# Patient Record
Sex: Female | Born: 1975
Health system: Southern US, Community
[De-identification: ages and names within clinical notes are randomized; demographics above are authoritative.]

## PROBLEM LIST (undated history)

## (undated) DIAGNOSIS — D649 Anemia, unspecified: Secondary | ICD-10-CM

## (undated) DIAGNOSIS — F329 Major depressive disorder, single episode, unspecified: Secondary | ICD-10-CM

## (undated) DIAGNOSIS — K589 Irritable bowel syndrome without diarrhea: Secondary | ICD-10-CM

## (undated) DIAGNOSIS — K219 Gastro-esophageal reflux disease without esophagitis: Secondary | ICD-10-CM

## (undated) DIAGNOSIS — F4310 Post-traumatic stress disorder, unspecified: Secondary | ICD-10-CM

## (undated) DIAGNOSIS — F32A Depression, unspecified: Secondary | ICD-10-CM

## (undated) DIAGNOSIS — F79 Unspecified intellectual disabilities: Secondary | ICD-10-CM

## (undated) DIAGNOSIS — T7421XA Adult sexual abuse, confirmed, initial encounter: Secondary | ICD-10-CM

## (undated) DIAGNOSIS — F29 Unspecified psychosis not due to a substance or known physiological condition: Secondary | ICD-10-CM

## (undated) HISTORY — PX: BACK SURGERY: SHX140

## (undated) HISTORY — DX: Anemia, unspecified: D64.9

## (undated) HISTORY — DX: Gastro-esophageal reflux disease without esophagitis: K21.9

## (undated) HISTORY — DX: Irritable bowel syndrome, unspecified: K58.9

## (undated) HISTORY — PX: WRIST SURGERY: SHX841

## (undated) HISTORY — DX: Adult sexual abuse, confirmed, initial encounter: T74.21XA

---

## 2001-04-06 ENCOUNTER — Inpatient Hospital Stay (HOSPITAL_COMMUNITY): Admission: EM | Admit: 2001-04-06 | Discharge: 2001-04-09 | Payer: Self-pay | Admitting: Psychiatry

## 2001-11-26 ENCOUNTER — Inpatient Hospital Stay (HOSPITAL_COMMUNITY): Admission: EM | Admit: 2001-11-26 | Discharge: 2001-11-30 | Payer: Self-pay | Admitting: Psychiatry

## 2004-07-26 ENCOUNTER — Emergency Department (HOSPITAL_COMMUNITY): Admission: EM | Admit: 2004-07-26 | Discharge: 2004-07-26 | Payer: Self-pay | Admitting: Emergency Medicine

## 2006-07-15 ENCOUNTER — Encounter: Admission: RE | Admit: 2006-07-15 | Discharge: 2006-07-15 | Payer: Self-pay | Admitting: Family Medicine

## 2006-12-30 ENCOUNTER — Other Ambulatory Visit: Admission: RE | Admit: 2006-12-30 | Discharge: 2006-12-30 | Payer: Self-pay | Admitting: Family Medicine

## 2008-01-11 ENCOUNTER — Other Ambulatory Visit: Admission: RE | Admit: 2008-01-11 | Discharge: 2008-01-11 | Payer: Self-pay | Admitting: Family Medicine

## 2008-03-09 ENCOUNTER — Encounter: Admission: RE | Admit: 2008-03-09 | Discharge: 2008-03-09 | Payer: Self-pay | Admitting: Family Medicine

## 2008-04-14 ENCOUNTER — Emergency Department (HOSPITAL_COMMUNITY): Admission: EM | Admit: 2008-04-14 | Discharge: 2008-04-14 | Payer: Self-pay | Admitting: Emergency Medicine

## 2008-12-19 ENCOUNTER — Emergency Department (HOSPITAL_COMMUNITY): Admission: EM | Admit: 2008-12-19 | Discharge: 2008-12-19 | Payer: Self-pay | Admitting: Emergency Medicine

## 2010-04-17 LAB — BASIC METABOLIC PANEL
BUN: 18 mg/dL (ref 6–23)
CO2: 26 mEq/L (ref 19–32)
Calcium: 8.9 mg/dL (ref 8.4–10.5)
Chloride: 103 mEq/L (ref 96–112)
Creatinine, Ser: 0.98 mg/dL (ref 0.4–1.2)
GFR calc Af Amer: >60 mL/min (ref >60)
GFR calc non Af Amer: >60 mL/min (ref >60)
Glucose, Bld: 81 mg/dL (ref 70–99)
Potassium: 3.6 mEq/L (ref 3.5–5.1)
Sodium: 137 mEq/L (ref 135–145)

## 2010-04-17 LAB — CBC
HCT: 37.7 % (ref 36.0–46.0)
Hemoglobin: 12.5 g/dL (ref 12.0–15.0)
MCHC: 33.1 g/dL (ref 30.0–36.0)
MCV: 90.1 fL (ref 78.0–100.0)
Platelets: 140 10*3/uL — ABNORMAL LOW (ref 150–400)
RBC: 4.19 MIL/uL (ref 3.87–5.11)
RDW: 13 % (ref 11.5–15.5)
WBC: 5 10*3/uL (ref 4.0–10.5)

## 2010-04-17 LAB — POCT CARDIAC MARKERS
CKMB, poc: <1 ng/mL — ABNORMAL LOW (ref 1.0–8.0)
Myoglobin, poc: 68 ng/mL (ref 12–200)
Troponin i, poc: <0.05 ng/mL (ref 0.00–0.09)

## 2010-04-17 LAB — D-DIMER, QUANTITATIVE: D-Dimer, Quant: 0.24 ug/mL-FEU (ref 0.00–0.48)

## 2010-04-25 LAB — COMPREHENSIVE METABOLIC PANEL
ALT: 25 U/L (ref 0–35)
AST: 23 U/L (ref 0–37)
Albumin: 3.7 g/dL (ref 3.5–5.2)
Alkaline Phosphatase: 75 U/L (ref 39–117)
BUN: 6 mg/dL (ref 6–23)
CO2: 27 mEq/L (ref 19–32)
Calcium: 9 mg/dL (ref 8.4–10.5)
Chloride: 110 mEq/L (ref 96–112)
Creatinine, Ser: 0.67 mg/dL (ref 0.4–1.2)
GFR calc Af Amer: >60 mL/min (ref >60)
GFR calc non Af Amer: >60 mL/min (ref >60)
Glucose, Bld: 100 mg/dL — ABNORMAL HIGH (ref 70–99)
Potassium: 3.7 mEq/L (ref 3.5–5.1)
Sodium: 142 mEq/L (ref 135–145)
Total Bilirubin: 0.5 mg/dL (ref 0.3–1.2)
Total Protein: 6.8 g/dL (ref 6.0–8.3)

## 2010-04-25 LAB — CBC
HCT: 38.8 % (ref 36.0–46.0)
Hemoglobin: 13.3 g/dL (ref 12.0–15.0)
MCHC: 34.4 g/dL (ref 30.0–36.0)
MCV: 89.4 fL (ref 78.0–100.0)
Platelets: 132 10*3/uL — ABNORMAL LOW (ref 150–400)
RBC: 4.33 MIL/uL (ref 3.87–5.11)
RDW: 13.1 % (ref 11.5–15.5)
WBC: 6.7 10*3/uL (ref 4.0–10.5)

## 2010-04-25 LAB — URINALYSIS, ROUTINE W REFLEX MICROSCOPIC
Bilirubin Urine: NEGATIVE
Glucose, UA: NEGATIVE mg/dL
Hgb urine dipstick: NEGATIVE
Ketones, ur: NEGATIVE mg/dL
Nitrite: NEGATIVE
Protein, ur: NEGATIVE mg/dL
Specific Gravity, Urine: 1.02 (ref 1.005–1.030)
Urobilinogen, UA: 0.2 mg/dL (ref 0.0–1.0)
pH: 7.5 (ref 5.0–8.0)

## 2010-04-25 LAB — DIFFERENTIAL
Basophils Absolute: 0 10*3/uL (ref 0.0–0.1)
Basophils Relative: 1 % (ref 0–1)
Eosinophils Absolute: 0 10*3/uL (ref 0.0–0.7)
Eosinophils Relative: 1 % (ref 0–5)
Lymphocytes Relative: 32 % (ref 12–46)
Lymphs Abs: 2.2 10*3/uL (ref 0.7–4.0)
Monocytes Absolute: 0.5 10*3/uL (ref 0.1–1.0)
Monocytes Relative: 8 % (ref 3–12)
Neutro Abs: 4 10*3/uL (ref 1.7–7.7)
Neutrophils Relative %: 59 % (ref 43–77)

## 2010-04-25 LAB — RAPID URINE DRUG SCREEN, HOSP PERFORMED
Amphetamines: NOT DETECTED
Barbiturates: NOT DETECTED
Benzodiazepines: POSITIVE — AB
Cocaine: NOT DETECTED
Opiates: NOT DETECTED
Tetrahydrocannabinol: NOT DETECTED
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
## 2010-06-01 NOTE — Discharge Summary (Signed)
[facility]  Patient:    Sonya Moran, Sonya Moran Visit Number: [id number] MRN: [id number]          Service Type: PSY Location: 400 0400 02 Attending Physician:  Jeanice Lim Dictated by:   Jeanice Lim, M.D. Admit Date:  04/06/2001 Discharge Date: 04/09/2001                             Discharge Summary  IDENTIFYING DATA:  A 35 year old single Caucasian, voluntarily admitted, MR patient having nightmares, episodes of agitation, and out-of-control recent behaviors.  ADMISSION MEDICATIONS:  Ortho Tri-Cyclen and Paxil.  ALLERGIES:  ANTIFUNGAL CREAMS.  PHYSICAL EXAMINATION:  Essentially within normal limits, neurologically nonfocal.  MENTAL STATUS EXAMINATION:  Medium-build, fully alert, sad, depressed female. Affect restricted.  Thought process:  Limited intelligence.  No acute suicidal, homicidal, or violent ideation, no clear psychotic symptoms. Cognitively at baseline.  Judgment and insight poor.  ADMISSION DIAGNOSES: Axis I:    Possible post traumatic stress disorder. Axis II:   Mental retardation not otherwise specified. Axis III:  None. Axis IV:   Moderate, support system. Axis V:    24/55.  HOSPITAL COURSE:  The patient was admitted and ordered routine p.r.n. medications, continued on Paxil.  The patient exhibited no dangerous behavior, no impulse control problems or other problematic behavior that would indicate need for continued inpatient treatment, after optimizing Paxil.  CONDITION ON DISCHARGE:  Improved.  Mood was more euthymic, affect brighter, thought process goal directed.  Thought content negative for dangerous ideation or psychotic symptoms.  DISCHARGE MEDICATIONS: 1. Birth control pill as previously directed. 2. Paxil CR 25 mg q.a.m.  DISPOSITION:  Follow up at [facility] April 1 at 9:30 a.m. Dictated by:   Jeanice Lim, M.D. Attending Physician:  Jeanice Lim DD:  05/14/01 TD:   05/15/01 Job: [id number]
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
## 2010-09-24 ENCOUNTER — Emergency Department (HOSPITAL_BASED_OUTPATIENT_CLINIC_OR_DEPARTMENT_OTHER)
Admission: EM | Admit: 2010-09-24 | Discharge: 2010-09-24 | Disposition: A | Discharge status: Home / Self Care | Payer: Medicare Other | Attending: Emergency Medicine | Admitting: Emergency Medicine

## 2010-09-24 ENCOUNTER — Emergency Department (INDEPENDENT_AMBULATORY_CARE_PROVIDER_SITE_OTHER): Payer: Medicare Other

## 2010-09-24 DIAGNOSIS — W19XXXA Unspecified fall, initial encounter: Secondary | ICD-10-CM

## 2010-09-24 DIAGNOSIS — S5000XA Contusion of unspecified elbow, initial encounter: Secondary | ICD-10-CM | POA: Insufficient documentation

## 2010-09-24 DIAGNOSIS — W1809XA Striking against other object with subsequent fall, initial encounter: Secondary | ICD-10-CM | POA: Insufficient documentation

## 2010-09-24 DIAGNOSIS — I10 Essential (primary) hypertension: Secondary | ICD-10-CM | POA: Insufficient documentation

## 2010-09-24 DIAGNOSIS — F79 Unspecified intellectual disabilities: Secondary | ICD-10-CM | POA: Insufficient documentation

## 2010-09-24 DIAGNOSIS — M25529 Pain in unspecified elbow: Secondary | ICD-10-CM

## 2010-09-24 HISTORY — DX: Post-traumatic stress disorder, unspecified: F43.10

## 2010-09-24 HISTORY — DX: Unspecified psychosis not due to a substance or known physiological condition: F29

## 2010-09-24 HISTORY — DX: Depression, unspecified: F32.A

## 2010-09-24 HISTORY — DX: Major depressive disorder, single episode, unspecified: F32.9

## 2010-09-24 HISTORY — DX: Unspecified intellectual disabilities: F79

## 2010-09-24 NOTE — ED Notes (Signed)
Fell-pain to left arm

## 2010-09-24 NOTE — ED Provider Notes (Signed)
History     CSN: [id number] Arrival date & time: 09/24/2010 10:25 PM  Chief Complaint  Patient presents with  . Arm Injury   HPI History is limited by patient's mental retardation patient is a 35 year old female who comes in tonight after falling and striking her left elbow. Her caregiver is with her and states that she did not hit her head or have any injury besides the left elbow. The patient complains only of pain to the left elbow. Past Medical History  Diagnosis Date  . Mental retardation   . Post traumatic stress disorder   . Depressive disorder   . Psychotic disorder   . Hypertension     Past Surgical History  Procedure Date  . Wrist surgery   . Back surgery     No family history on file.  History  Substance Use Topics  . Smoking status: Never Smoker   . Smokeless tobacco: Not on file  . Alcohol Use: No    OB History    Grav Para Term Preterm Abortions TAB SAB Ect Mult Living                  Review of Systems  Unable to perform ROS   Physical Exam  BP 121/88  Pulse 81  Temp(Src) 98.7 F (37.1 C) (Oral)  Resp 20  SpO2 98% Vital signs are reviewed and are normal Well-developed well-nourished female does not appear to be in any acute distress HEENT normocephalic atraumatic Neck nontender full range of motion of motion extremities left shoulder is normal left elbow has contusion with minimal swelling and is diffusely tender. Distal to the injury the radial pulses 2+. The fingers have 2. sensation are pink with good capillary refill. Patient has full active range of motion of her shoulder her elbow and her wrist and hands and finger. Physical Exam  ED Course  Procedures  MDM Contusion, no fracture or other injuries noted    Hilario Quarry, MD 09/25/10 [phone]

## 2010-09-26 ENCOUNTER — Emergency Department (HOSPITAL_COMMUNITY)
Admission: EM | Admit: 2010-09-26 | Discharge: 2010-09-27 | Disposition: A | Discharge status: Other Acute Inpatient Hospital | Payer: Medicare Other | Attending: Emergency Medicine | Admitting: Emergency Medicine

## 2010-09-26 DIAGNOSIS — R45851 Suicidal ideations: Secondary | ICD-10-CM | POA: Insufficient documentation

## 2010-09-26 DIAGNOSIS — F29 Unspecified psychosis not due to a substance or known physiological condition: Secondary | ICD-10-CM | POA: Insufficient documentation

## 2010-09-26 DIAGNOSIS — F79 Unspecified intellectual disabilities: Secondary | ICD-10-CM | POA: Insufficient documentation

## 2010-09-26 LAB — CBC
HCT: 31.9 % — ABNORMAL LOW (ref 36.0–46.0)
Hemoglobin: 11.1 g/dL — ABNORMAL LOW (ref 12.0–15.0)
MCH: 30.8 pg (ref 26.0–34.0)
MCHC: 34.8 g/dL (ref 30.0–36.0)
MCV: 88.6 fL (ref 78.0–100.0)
Platelets: 123 10*3/uL — ABNORMAL LOW (ref 150–400)
RBC: 3.6 MIL/uL — ABNORMAL LOW (ref 3.87–5.11)
RDW: 12.3 % (ref 11.5–15.5)
WBC: 4 10*3/uL (ref 4.0–10.5)

## 2010-09-26 LAB — COMPREHENSIVE METABOLIC PANEL
ALT: 39 U/L — ABNORMAL HIGH (ref 0–35)
AST: 48 U/L — ABNORMAL HIGH (ref 0–37)
Albumin: 3.2 g/dL — ABNORMAL LOW (ref 3.5–5.2)
Alkaline Phosphatase: 47 U/L (ref 39–117)
BUN: 8 mg/dL (ref 6–23)
CO2: 23 mEq/L (ref 19–32)
Calcium: 8.7 mg/dL (ref 8.4–10.5)
Chloride: 90 mEq/L — ABNORMAL LOW (ref 96–112)
Creatinine, Ser: 0.72 mg/dL (ref 0.50–1.10)
GFR calc Af Amer: >60 mL/min (ref >60)
GFR calc non Af Amer: >60 mL/min (ref >60)
Glucose, Bld: 103 mg/dL — ABNORMAL HIGH (ref 70–99)
Potassium: 3.6 mEq/L (ref 3.5–5.1)
Sodium: 123 mEq/L — ABNORMAL LOW (ref 135–145)
Total Bilirubin: 0.3 mg/dL (ref 0.3–1.2)
Total Protein: 6.9 g/dL (ref 6.0–8.3)

## 2010-09-26 LAB — URINALYSIS, ROUTINE W REFLEX MICROSCOPIC
Bilirubin Urine: NEGATIVE
Glucose, UA: NEGATIVE mg/dL
Hgb urine dipstick: NEGATIVE
Ketones, ur: NEGATIVE mg/dL
Nitrite: NEGATIVE
Protein, ur: NEGATIVE mg/dL
Specific Gravity, Urine: 1.008 (ref 1.005–1.030)
Urobilinogen, UA: 0.2 mg/dL (ref 0.0–1.0)
pH: 7 (ref 5.0–8.0)

## 2010-09-26 LAB — DIFFERENTIAL
Basophils Absolute: 0 10*3/uL (ref 0.0–0.1)
Basophils Relative: 0 % (ref 0–1)
Eosinophils Absolute: 0 10*3/uL (ref 0.0–0.7)
Eosinophils Relative: 1 % (ref 0–5)
Lymphocytes Relative: 42 % (ref 12–46)
Lymphs Abs: 1.7 10*3/uL (ref 0.7–4.0)
Monocytes Absolute: 0.5 10*3/uL (ref 0.1–1.0)
Monocytes Relative: 14 % — ABNORMAL HIGH (ref 3–12)
Neutro Abs: 1.8 10*3/uL (ref 1.7–7.7)
Neutrophils Relative %: 44 % (ref 43–77)

## 2010-09-26 LAB — ETHANOL: Alcohol, Ethyl (B): <11 mg/dL (ref 0–11)

## 2010-09-26 LAB — RAPID URINE DRUG SCREEN, HOSP PERFORMED
Amphetamines: NOT DETECTED
Barbiturates: NOT DETECTED
Benzodiazepines: NOT DETECTED
Cocaine: NOT DETECTED
Opiates: NOT DETECTED
Tetrahydrocannabinol: NOT DETECTED

## 2010-09-26 LAB — URINE MICROSCOPIC-ADD ON

## 2010-09-26 LAB — POCT PREGNANCY, URINE: Preg Test, Ur: NEGATIVE

## 2010-09-27 LAB — VALPROIC ACID LEVEL: Valproic Acid Lvl: 32.4 ug/mL — ABNORMAL LOW (ref 50.0–100.0)

## 2010-09-27 LAB — BASIC METABOLIC PANEL
BUN: 11 mg/dL (ref 6–23)
CO2: 23 mEq/L (ref 19–32)
Calcium: 9.5 mg/dL (ref 8.4–10.5)
Chloride: 100 mEq/L (ref 96–112)
Creatinine, Ser: 0.76 mg/dL (ref 0.50–1.10)
GFR calc Af Amer: >60 mL/min (ref >60)
GFR calc non Af Amer: >60 mL/min (ref >60)
Glucose, Bld: 148 mg/dL — ABNORMAL HIGH (ref 70–99)
Potassium: 3.9 mEq/L (ref 3.5–5.1)
Sodium: 132 mEq/L — ABNORMAL LOW (ref 135–145)

## 2010-10-09 ENCOUNTER — Emergency Department (HOSPITAL_COMMUNITY)
Admission: EM | Admit: 2010-10-09 | Discharge: 2010-10-10 | Disposition: A | Discharge status: Other Acute Inpatient Hospital | Payer: Medicare Other | Attending: Emergency Medicine | Admitting: Emergency Medicine

## 2010-10-09 DIAGNOSIS — F209 Schizophrenia, unspecified: Secondary | ICD-10-CM | POA: Insufficient documentation

## 2010-10-09 DIAGNOSIS — F79 Unspecified intellectual disabilities: Secondary | ICD-10-CM | POA: Insufficient documentation

## 2010-10-09 LAB — POCT I-STAT, CHEM 8
BUN: 5 mg/dL — ABNORMAL LOW (ref 6–23)
Calcium, Ion: 1.17 mmol/L (ref 1.12–1.32)
Chloride: 96 mEq/L (ref 96–112)
Creatinine, Ser: 0.9 mg/dL (ref 0.50–1.10)
Glucose, Bld: 128 mg/dL — ABNORMAL HIGH (ref 70–99)
HCT: 36 % (ref 36.0–46.0)
Hemoglobin: 12.2 g/dL (ref 12.0–15.0)
Potassium: 3.4 mEq/L — ABNORMAL LOW (ref 3.5–5.1)
Sodium: 130 mEq/L — ABNORMAL LOW (ref 135–145)
TCO2: 20 mmol/L (ref 0–100)

## 2010-10-09 LAB — ETHANOL: Alcohol, Ethyl (B): <11 mg/dL (ref 0–11)

## 2010-10-10 LAB — BASIC METABOLIC PANEL
BUN: 5 mg/dL — ABNORMAL LOW (ref 6–23)
CO2: 25 mEq/L (ref 19–32)
Calcium: 9.5 mg/dL (ref 8.4–10.5)
Chloride: 103 mEq/L (ref 96–112)
Creatinine, Ser: 0.76 mg/dL (ref 0.50–1.10)
GFR calc Af Amer: >60 mL/min (ref >60)
GFR calc non Af Amer: >60 mL/min (ref >60)
Glucose, Bld: 93 mg/dL (ref 70–99)
Potassium: 4 mEq/L (ref 3.5–5.1)
Sodium: 137 mEq/L (ref 135–145)

## 2010-10-10 LAB — CBC
HCT: 37.2 % (ref 36.0–46.0)
Hemoglobin: 12.7 g/dL (ref 12.0–15.0)
MCH: 30.7 pg (ref 26.0–34.0)
MCHC: 34.1 g/dL (ref 30.0–36.0)
MCV: 89.9 fL (ref 78.0–100.0)
Platelets: 148 10*3/uL — ABNORMAL LOW (ref 150–400)
RBC: 4.14 MIL/uL (ref 3.87–5.11)
RDW: 12.7 % (ref 11.5–15.5)
WBC: 4.2 10*3/uL (ref 4.0–10.5)

## 2010-10-10 LAB — DIFFERENTIAL
Basophils Absolute: 0 10*3/uL (ref 0.0–0.1)
Basophils Relative: 0 % (ref 0–1)
Eosinophils Absolute: 0 10*3/uL (ref 0.0–0.7)
Eosinophils Relative: 1 % (ref 0–5)
Lymphocytes Relative: 30 % (ref 12–46)
Lymphs Abs: 1.3 10*3/uL (ref 0.7–4.0)
Monocytes Absolute: 0.6 10*3/uL (ref 0.1–1.0)
Monocytes Relative: 13 % — ABNORMAL HIGH (ref 3–12)
Neutro Abs: 2.4 10*3/uL (ref 1.7–7.7)
Neutrophils Relative %: 56 % (ref 43–77)

## 2010-10-10 LAB — RAPID URINE DRUG SCREEN, HOSP PERFORMED
Amphetamines: NOT DETECTED
Barbiturates: NOT DETECTED
Benzodiazepines: NOT DETECTED
Cocaine: NOT DETECTED
Opiates: NOT DETECTED
Tetrahydrocannabinol: NOT DETECTED

## 2011-02-25 DIAGNOSIS — J310 Chronic rhinitis: Secondary | ICD-10-CM | POA: Diagnosis not present

## 2011-02-25 DIAGNOSIS — H6690 Otitis media, unspecified, unspecified ear: Secondary | ICD-10-CM | POA: Diagnosis not present

## 2011-02-25 DIAGNOSIS — J309 Allergic rhinitis, unspecified: Secondary | ICD-10-CM | POA: Diagnosis not present

## 2011-03-11 DIAGNOSIS — H1045 Other chronic allergic conjunctivitis: Secondary | ICD-10-CM | POA: Diagnosis not present

## 2011-03-12 DIAGNOSIS — H6690 Otitis media, unspecified, unspecified ear: Secondary | ICD-10-CM | POA: Diagnosis not present

## 2011-03-19 DIAGNOSIS — Z3049 Encounter for surveillance of other contraceptives: Secondary | ICD-10-CM | POA: Diagnosis not present

## 2011-03-19 DIAGNOSIS — Z3200 Encounter for pregnancy test, result unknown: Secondary | ICD-10-CM | POA: Diagnosis not present

## 2011-04-16 DIAGNOSIS — F2089 Other schizophrenia: Secondary | ICD-10-CM | POA: Diagnosis not present

## 2011-05-21 DIAGNOSIS — F71 Moderate intellectual disabilities: Secondary | ICD-10-CM | POA: Diagnosis not present

## 2011-05-21 DIAGNOSIS — F6381 Intermittent explosive disorder: Secondary | ICD-10-CM | POA: Diagnosis not present

## 2011-05-21 DIAGNOSIS — F39 Unspecified mood [affective] disorder: Secondary | ICD-10-CM | POA: Diagnosis not present

## 2011-05-29 DIAGNOSIS — H1045 Other chronic allergic conjunctivitis: Secondary | ICD-10-CM | POA: Diagnosis not present

## 2011-06-04 DIAGNOSIS — Z3049 Encounter for surveillance of other contraceptives: Secondary | ICD-10-CM | POA: Diagnosis not present

## 2011-06-06 ENCOUNTER — Emergency Department (HOSPITAL_BASED_OUTPATIENT_CLINIC_OR_DEPARTMENT_OTHER)
Admission: EM | Admit: 2011-06-06 | Discharge: 2011-06-06 | Disposition: A | Discharge status: Home / Self Care | Payer: No Typology Code available for payment source | Attending: Emergency Medicine | Admitting: Emergency Medicine

## 2011-06-06 ENCOUNTER — Encounter (HOSPITAL_BASED_OUTPATIENT_CLINIC_OR_DEPARTMENT_OTHER): Payer: Self-pay | Admitting: Family Medicine

## 2011-06-06 DIAGNOSIS — S40022A Contusion of left upper arm, initial encounter: Secondary | ICD-10-CM

## 2011-06-06 DIAGNOSIS — F4310 Post-traumatic stress disorder, unspecified: Secondary | ICD-10-CM | POA: Insufficient documentation

## 2011-06-06 DIAGNOSIS — F79 Unspecified intellectual disabilities: Secondary | ICD-10-CM | POA: Insufficient documentation

## 2011-06-06 DIAGNOSIS — M79609 Pain in unspecified limb: Secondary | ICD-10-CM | POA: Insufficient documentation

## 2011-06-06 DIAGNOSIS — S40029A Contusion of unspecified upper arm, initial encounter: Secondary | ICD-10-CM | POA: Insufficient documentation
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
## 2011-06-07 NOTE — ED Provider Notes (Signed)
Medical screening examination/treatment/procedure(s) were performed by non-physician practitioner and as supervising physician I was immediately available for consultation/collaboration.   Sterling Ucci A Jameria Bradway, MD 06/07/11 0006 

## 2011-06-17 DIAGNOSIS — Z124 Encounter for screening for malignant neoplasm of cervix: Secondary | ICD-10-CM | POA: Diagnosis not present

## 2011-06-17 DIAGNOSIS — R300 Dysuria: Secondary | ICD-10-CM | POA: Diagnosis not present

## 2011-06-17 DIAGNOSIS — N760 Acute vaginitis: Secondary | ICD-10-CM | POA: Diagnosis not present

## 2011-06-17 DIAGNOSIS — B373 Candidiasis of vulva and vagina: Secondary | ICD-10-CM | POA: Diagnosis not present

## 2011-06-17 DIAGNOSIS — N390 Urinary tract infection, site not specified: Secondary | ICD-10-CM | POA: Diagnosis not present

## 2011-08-27 ENCOUNTER — Emergency Department (HOSPITAL_BASED_OUTPATIENT_CLINIC_OR_DEPARTMENT_OTHER)
Admission: EM | Admit: 2011-08-27 | Discharge: 2011-08-27 | Disposition: A | Discharge status: Home / Self Care | Payer: Medicare Other | Attending: Emergency Medicine | Admitting: Emergency Medicine

## 2011-08-27 ENCOUNTER — Emergency Department (HOSPITAL_BASED_OUTPATIENT_CLINIC_OR_DEPARTMENT_OTHER): Payer: Medicare Other

## 2011-08-27 ENCOUNTER — Encounter (HOSPITAL_BASED_OUTPATIENT_CLINIC_OR_DEPARTMENT_OTHER): Payer: Self-pay | Admitting: Emergency Medicine

## 2011-08-27 DIAGNOSIS — M79641 Pain in right hand: Secondary | ICD-10-CM

## 2011-08-27 DIAGNOSIS — Z043 Encounter for examination and observation following other accident: Secondary | ICD-10-CM | POA: Diagnosis not present

## 2011-08-27 DIAGNOSIS — Z3049 Encounter for surveillance of other contraceptives: Secondary | ICD-10-CM | POA: Diagnosis not present

## 2011-08-27 DIAGNOSIS — F4310 Post-traumatic stress disorder, unspecified: Secondary | ICD-10-CM | POA: Insufficient documentation

## 2011-08-27 DIAGNOSIS — F79 Unspecified intellectual disabilities: Secondary | ICD-10-CM | POA: Diagnosis not present

## 2011-08-27 DIAGNOSIS — M79609 Pain in unspecified limb: Secondary | ICD-10-CM | POA: Diagnosis not present

## 2011-08-27 MED ORDER — ACETAMINOPHEN 325 MG PO TABS
650.0000 mg | ORAL_TABLET | Freq: Once | ORAL | Status: AC
  Administered 2011-08-27: 650 mg via ORAL
  Filled 2011-08-27: qty 2

## 2011-08-28 NOTE — ED Provider Notes (Signed)
History     CSN: [id number]  Arrival date & time 08/27/11  1607   First MD Initiated Contact with Patient 08/27/11 1611      Chief Complaint  Patient presents with  . Hand Injury    (Consider location/radiation/quality/duration/timing/severity/associated sxs/prior treatment) HPI History from patient. 36 year old female presents with hand injury. She was at work in [employer] and was getting a box of cups off a shelf when a box fell on top of her. She fell off the step stool and caught herself with her right hand. She is currently experiencing pain just below her middle finger on her hand. She feels as if the hand is swollen slightly. She denies any distal numbness or weakness. No history of injury to the hand previously.  Past Medical History  Diagnosis Date  . Mental retardation   . Post traumatic stress disorder   . Depressive disorder   . Psychotic disorder     Past Surgical History  Procedure Date  . Wrist surgery   . Back surgery     No family history on file.  History  Substance Use Topics  . Smoking status: Never Smoker   . Smokeless tobacco: Not on file  . Alcohol Use: No    OB History    Grav Para Term Preterm Abortions TAB SAB Ect Mult Living                  Review of Systems  Constitutional: Negative.   Musculoskeletal: Positive for myalgias.  Skin: Negative for color change and rash.  Neurological: Negative for weakness and numbness.    Allergies  Grifulvin v  Home Medications   Current Outpatient Rx  Name Route Sig Dispense Refill  . BECLOMETHASONE DIPROPIONATE 80 MCG/ACT NA AERS Nasal Place 2 sprays into the nose daily.     . CELECOXIB 100 MG PO CAPS Oral Take 100 mg by mouth daily.    Marland Kitchen CETIRIZINE HCL 10 MG PO TABS Oral Take 10 mg by mouth every evening.     Marland Kitchen DIVALPROEX SODIUM 500 MG PO TBEC Oral Take 1,000 mg by mouth at bedtime.     . FENOFIBRATE 145 MG PO TABS Oral Take 145 mg by mouth daily.      Marland Kitchen FLUOXETINE HCL 40 MG PO CAPS  Oral Take 40 mg by mouth daily.      Marland Kitchen KETOTIFEN FUMARATE 0.025 % OP SOLN Both Eyes Place 1 drop into both eyes 2 (two) times daily as needed. After swimming    . LIDOCAINE HCL 2 % EX GEL Topical Apply 1 application topically every 8 (eight) hours as needed. pain    . MONTELUKAST SODIUM 10 MG PO TABS Oral Take 10 mg by mouth at bedtime.      . OLOPATADINE HCL 0.2 % OP SOLN Both Eyes Place 1 drop into both eyes daily.    . OLOPATADINE HCL 0.6 % NA SOLN Each Nare Place 1-2 sprays into both nostrils 2 (two) times daily. After saline wash    . OMEPRAZOLE 20 MG PO CPDR Oral Take 40 mg by mouth daily.     Marland Kitchen METAMUCIL PO Oral Take 5 g by mouth at bedtime.    Marland Kitchen QUETIAPINE FUMARATE 300 MG PO TABS Oral Take 300 mg by mouth at bedtime.    . SODIUM CHLORIDE 0.65 % NA SOLN Nasal Place 1 spray into the nose 3 (three) times daily.        BP 140/88  Pulse 111  Temp 97.9 F (36.6 C) (Oral)  Resp 18  Ht 4\' 11"  (1.499 m)  Wt 179 lb (81.194 kg)  BMI 36.15 kg/m2  SpO2 98%  Physical Exam  Nursing note and vitals reviewed. Constitutional: She appears well-developed and well-nourished. No distress.  HENT:  Head: Normocephalic and atraumatic.  Neck: Normal range of motion.  Cardiovascular: Normal rate.   Pulmonary/Chest: Effort normal.  Musculoskeletal: Normal range of motion.       Right hand: Mildly tender to palpation over the distal third metacarpal. There is no swelling or deformity noted. No crepitus palpable. Neurovascularly intact with sensory intact to light touch in radian, median, ulnar distributions. Grip strength equal bilaterally.  Neurological: She is alert.  Skin: Skin is warm and dry. She is not diaphoretic.  Psychiatric: She has a normal mood and affect.    ED Course  Procedures (including critical care time)  Labs Reviewed - No data to display Dg Hand Complete Right  08/27/2011  *RADIOLOGY REPORT*  Clinical Data: Fall  RIGHT HAND - COMPLETE 3+ VIEW  Comparison: None.  Findings:  Three views of the right hand submitted.  No acute fracture or subluxation.  No radiopaque foreign body.  IMPRESSION: No acute fracture or subluxation.  Original Report Authenticated By: Natasha Mead, M.D.     1. Hand pain, right       MDM  Patient presents with pain after a fall. She is mildly tender to palpation to the distal third metacarpal on exam. X-rays which I personally reviewed are negative for fracture. Patient placed in a splint for comfort, instructed to take ibuprofen over-the-counter as needed. Reasons to return discussed.        Grant Fontana, PA-C 08/28/11 2018

## 2011-08-29 NOTE — ED Provider Notes (Signed)
Medical screening examination/treatment/procedure(s) were performed by non-physician practitioner and as supervising physician I was immediately available for consultation/collaboration.   Dannae Kato B. Bernette Mayers, MD 08/29/11 4540

## 2011-09-09 DIAGNOSIS — J309 Allergic rhinitis, unspecified: Secondary | ICD-10-CM | POA: Diagnosis not present

## 2011-09-09 DIAGNOSIS — J310 Chronic rhinitis: Secondary | ICD-10-CM | POA: Diagnosis not present

## 2011-11-12 DIAGNOSIS — Z3049 Encounter for surveillance of other contraceptives: Secondary | ICD-10-CM | POA: Diagnosis not present

## 2011-11-20 DIAGNOSIS — F6381 Intermittent explosive disorder: Secondary | ICD-10-CM | POA: Diagnosis not present

## 2011-11-20 DIAGNOSIS — F39 Unspecified mood [affective] disorder: Secondary | ICD-10-CM | POA: Diagnosis not present

## 2011-11-20 DIAGNOSIS — F71 Moderate intellectual disabilities: Secondary | ICD-10-CM | POA: Diagnosis not present

## 2011-12-05 DIAGNOSIS — F39 Unspecified mood [affective] disorder: Secondary | ICD-10-CM | POA: Diagnosis not present

## 2011-12-05 DIAGNOSIS — Z79899 Other long term (current) drug therapy: Secondary | ICD-10-CM | POA: Diagnosis not present

## 2012-01-03 DIAGNOSIS — Z79899 Other long term (current) drug therapy: Secondary | ICD-10-CM | POA: Diagnosis not present

## 2012-01-30 DIAGNOSIS — Z3049 Encounter for surveillance of other contraceptives: Secondary | ICD-10-CM | POA: Diagnosis not present

## 2012-03-24 DIAGNOSIS — H1045 Other chronic allergic conjunctivitis: Secondary | ICD-10-CM | POA: Diagnosis not present

## 2012-04-13 DIAGNOSIS — K649 Unspecified hemorrhoids: Secondary | ICD-10-CM | POA: Diagnosis not present

## 2012-04-13 DIAGNOSIS — N760 Acute vaginitis: Secondary | ICD-10-CM | POA: Diagnosis not present

## 2012-04-27 DIAGNOSIS — Z3049 Encounter for surveillance of other contraceptives: Secondary | ICD-10-CM | POA: Diagnosis not present

## 2012-05-14 DIAGNOSIS — J309 Allergic rhinitis, unspecified: Secondary | ICD-10-CM | POA: Diagnosis not present

## 2012-05-14 DIAGNOSIS — J310 Chronic rhinitis: Secondary | ICD-10-CM | POA: Diagnosis not present

## 2012-05-27 DIAGNOSIS — F71 Moderate intellectual disabilities: Secondary | ICD-10-CM | POA: Diagnosis not present

## 2012-05-27 DIAGNOSIS — F39 Unspecified mood [affective] disorder: Secondary | ICD-10-CM | POA: Diagnosis not present

## 2012-05-27 DIAGNOSIS — F6381 Intermittent explosive disorder: Secondary | ICD-10-CM | POA: Diagnosis not present

## 2012-06-22 ENCOUNTER — Ambulatory Visit: Payer: Self-pay | Admitting: Podiatry

## 2012-06-24 ENCOUNTER — Encounter: Payer: Self-pay | Admitting: Podiatry

## 2012-06-24 ENCOUNTER — Ambulatory Visit (INDEPENDENT_AMBULATORY_CARE_PROVIDER_SITE_OTHER): Payer: Medicare Other | Admitting: Podiatry

## 2012-06-24 VITALS — BP 123/89 | HR 80

## 2012-06-24 DIAGNOSIS — B351 Tinea unguium: Secondary | ICD-10-CM | POA: Insufficient documentation

## 2012-06-24 MED ORDER — CICLOPIROX 8 % EX SOLN: Freq: Every day | CUTANEOUS | Status: DC

## 2012-06-24 NOTE — Progress Notes (Signed)
Patient came in with a care taker complaining of discolorated toe nails. Has been treating the fungal nails with Penlac.  Has seen some improvement. No new findings in lower limbs. A: Onychomycosis improving. P: Continue with Penlac as directed. Follow up Q2 months.

## 2012-06-25 DIAGNOSIS — Z79899 Other long term (current) drug therapy: Secondary | ICD-10-CM | POA: Diagnosis not present

## 2012-06-25 DIAGNOSIS — F39 Unspecified mood [affective] disorder: Secondary | ICD-10-CM | POA: Diagnosis not present

## 2012-06-29 ENCOUNTER — Encounter: Payer: Self-pay | Admitting: Podiatry

## 2012-07-07 ENCOUNTER — Telehealth: Payer: Self-pay | Admitting: Podiatry

## 2012-07-07 NOTE — Telephone Encounter (Signed)
Patient's Caseworker called today and request a written RX for Over the counter  To be faxed to [facility] @ [phone]. State Law says everything has to have her name on it.

## 2012-07-14 DIAGNOSIS — Z3049 Encounter for surveillance of other contraceptives: Secondary | ICD-10-CM | POA: Diagnosis not present

## 2012-07-22 DIAGNOSIS — E669 Obesity, unspecified: Secondary | ICD-10-CM | POA: Diagnosis not present

## 2012-07-22 DIAGNOSIS — E785 Hyperlipidemia, unspecified: Secondary | ICD-10-CM | POA: Diagnosis not present

## 2012-07-22 DIAGNOSIS — E781 Pure hyperglyceridemia: Secondary | ICD-10-CM | POA: Diagnosis not present

## 2012-07-22 DIAGNOSIS — E7800 Pure hypercholesterolemia, unspecified: Secondary | ICD-10-CM | POA: Diagnosis not present

## 2012-07-22 DIAGNOSIS — G4733 Obstructive sleep apnea (adult) (pediatric): Secondary | ICD-10-CM | POA: Diagnosis not present

## 2012-07-22 DIAGNOSIS — F79 Unspecified intellectual disabilities: Secondary | ICD-10-CM | POA: Diagnosis not present

## 2012-07-29 DIAGNOSIS — E781 Pure hyperglyceridemia: Secondary | ICD-10-CM | POA: Diagnosis not present

## 2012-08-11 DIAGNOSIS — Z0000 Encounter for general adult medical examination without abnormal findings: Secondary | ICD-10-CM | POA: Diagnosis not present

## 2012-09-25 ENCOUNTER — Ambulatory Visit: Payer: Medicare Other | Admitting: Podiatry

## 2012-09-28 ENCOUNTER — Ambulatory Visit: Payer: Medicare Other | Admitting: Podiatry

## 2012-10-06 ENCOUNTER — Ambulatory Visit: Payer: Medicare Other | Admitting: Podiatry

## 2012-10-09 DIAGNOSIS — Z3049 Encounter for surveillance of other contraceptives: Secondary | ICD-10-CM | POA: Diagnosis not present

## 2012-10-28 DIAGNOSIS — H9209 Otalgia, unspecified ear: Secondary | ICD-10-CM | POA: Diagnosis not present

## 2012-10-28 DIAGNOSIS — Z23 Encounter for immunization: Secondary | ICD-10-CM | POA: Diagnosis not present

## 2012-11-17 DIAGNOSIS — H1045 Other chronic allergic conjunctivitis: Secondary | ICD-10-CM | POA: Diagnosis not present

## 2012-11-25 DIAGNOSIS — F39 Unspecified mood [affective] disorder: Secondary | ICD-10-CM | POA: Diagnosis not present

## 2012-11-25 DIAGNOSIS — F6381 Intermittent explosive disorder: Secondary | ICD-10-CM | POA: Diagnosis not present

## 2012-11-25 DIAGNOSIS — F71 Moderate intellectual disabilities: Secondary | ICD-10-CM | POA: Diagnosis not present

## 2012-12-15 DIAGNOSIS — D689 Coagulation defect, unspecified: Secondary | ICD-10-CM | POA: Diagnosis not present

## 2012-12-15 DIAGNOSIS — R635 Abnormal weight gain: Secondary | ICD-10-CM | POA: Diagnosis not present

## 2012-12-15 DIAGNOSIS — F259 Schizoaffective disorder, unspecified: Secondary | ICD-10-CM | POA: Diagnosis not present

## 2012-12-15 DIAGNOSIS — R5381 Other malaise: Secondary | ICD-10-CM | POA: Diagnosis not present

## 2012-12-15 DIAGNOSIS — Z79899 Other long term (current) drug therapy: Secondary | ICD-10-CM | POA: Diagnosis not present

## 2012-12-15 DIAGNOSIS — E871 Hypo-osmolality and hyponatremia: Secondary | ICD-10-CM | POA: Diagnosis not present

## 2012-12-16 DIAGNOSIS — Z23 Encounter for immunization: Secondary | ICD-10-CM | POA: Diagnosis not present

## 2012-12-25 DIAGNOSIS — Z3049 Encounter for surveillance of other contraceptives: Secondary | ICD-10-CM | POA: Diagnosis not present

## 2012-12-29 DIAGNOSIS — J069 Acute upper respiratory infection, unspecified: Secondary | ICD-10-CM | POA: Diagnosis not present

## 2012-12-30 DIAGNOSIS — R059 Cough, unspecified: Secondary | ICD-10-CM | POA: Diagnosis not present

## 2012-12-30 DIAGNOSIS — J310 Chronic rhinitis: Secondary | ICD-10-CM | POA: Diagnosis not present

## 2012-12-30 DIAGNOSIS — J309 Allergic rhinitis, unspecified: Secondary | ICD-10-CM | POA: Diagnosis not present

## 2012-12-30 DIAGNOSIS — H6690 Otitis media, unspecified, unspecified ear: Secondary | ICD-10-CM | POA: Diagnosis not present

## 2012-12-30 DIAGNOSIS — R05 Cough: Secondary | ICD-10-CM | POA: Diagnosis not present

## 2013-02-17 DIAGNOSIS — F71 Moderate intellectual disabilities: Secondary | ICD-10-CM | POA: Diagnosis not present

## 2013-02-17 DIAGNOSIS — F6381 Intermittent explosive disorder: Secondary | ICD-10-CM | POA: Diagnosis not present

## 2013-02-17 DIAGNOSIS — F39 Unspecified mood [affective] disorder: Secondary | ICD-10-CM | POA: Diagnosis not present

## 2013-03-08 DIAGNOSIS — Z79899 Other long term (current) drug therapy: Secondary | ICD-10-CM | POA: Diagnosis not present

## 2013-03-19 DIAGNOSIS — Z01419 Encounter for gynecological examination (general) (routine) without abnormal findings: Secondary | ICD-10-CM | POA: Diagnosis not present

## 2013-03-19 DIAGNOSIS — Z3049 Encounter for surveillance of other contraceptives: Secondary | ICD-10-CM | POA: Diagnosis not present

## 2013-03-19 DIAGNOSIS — Z124 Encounter for screening for malignant neoplasm of cervix: Secondary | ICD-10-CM | POA: Diagnosis not present

## 2013-03-24 DIAGNOSIS — F71 Moderate intellectual disabilities: Secondary | ICD-10-CM | POA: Diagnosis not present

## 2013-03-24 DIAGNOSIS — F6381 Intermittent explosive disorder: Secondary | ICD-10-CM | POA: Diagnosis not present

## 2013-03-24 DIAGNOSIS — F39 Unspecified mood [affective] disorder: Secondary | ICD-10-CM | POA: Diagnosis not present

## 2013-03-24 DIAGNOSIS — F70 Mild intellectual disabilities: Secondary | ICD-10-CM | POA: Diagnosis not present

## 2013-03-24 DIAGNOSIS — F259 Schizoaffective disorder, unspecified: Secondary | ICD-10-CM | POA: Diagnosis not present

## 2013-04-21 DIAGNOSIS — D649 Anemia, unspecified: Secondary | ICD-10-CM | POA: Diagnosis not present

## 2013-05-20 DIAGNOSIS — H2510 Age-related nuclear cataract, unspecified eye: Secondary | ICD-10-CM | POA: Diagnosis not present

## 2013-05-26 DIAGNOSIS — F39 Unspecified mood [affective] disorder: Secondary | ICD-10-CM | POA: Diagnosis not present

## 2013-05-26 DIAGNOSIS — D509 Iron deficiency anemia, unspecified: Secondary | ICD-10-CM | POA: Diagnosis not present

## 2013-05-26 DIAGNOSIS — F70 Mild intellectual disabilities: Secondary | ICD-10-CM | POA: Diagnosis not present

## 2013-05-26 DIAGNOSIS — Z79899 Other long term (current) drug therapy: Secondary | ICD-10-CM | POA: Diagnosis not present

## 2013-05-26 DIAGNOSIS — F71 Moderate intellectual disabilities: Secondary | ICD-10-CM | POA: Diagnosis not present

## 2013-05-26 DIAGNOSIS — F6381 Intermittent explosive disorder: Secondary | ICD-10-CM | POA: Diagnosis not present

## 2013-05-26 DIAGNOSIS — F259 Schizoaffective disorder, unspecified: Secondary | ICD-10-CM | POA: Diagnosis not present

## 2013-05-26 DIAGNOSIS — E559 Vitamin D deficiency, unspecified: Secondary | ICD-10-CM | POA: Diagnosis not present

## 2013-06-08 DIAGNOSIS — Z3049 Encounter for surveillance of other contraceptives: Secondary | ICD-10-CM | POA: Diagnosis not present

## 2013-06-11 ENCOUNTER — Encounter: Payer: Self-pay | Admitting: Hematology & Oncology

## 2013-06-11 ENCOUNTER — Ambulatory Visit (HOSPITAL_BASED_OUTPATIENT_CLINIC_OR_DEPARTMENT_OTHER): Payer: Medicare Other | Admitting: Hematology & Oncology

## 2013-06-11 ENCOUNTER — Ambulatory Visit: Payer: Medicare Other

## 2013-06-11 ENCOUNTER — Telehealth: Payer: Self-pay | Admitting: Hematology & Oncology

## 2013-06-11 ENCOUNTER — Other Ambulatory Visit (HOSPITAL_BASED_OUTPATIENT_CLINIC_OR_DEPARTMENT_OTHER): Payer: Medicare Other | Admitting: Lab

## 2013-06-11 VITALS — BP 102/64 | HR 70 | Temp 98.5°F | Resp 16 | Ht <= 58 in | Wt 174.0 lb

## 2013-06-11 DIAGNOSIS — D649 Anemia, unspecified: Secondary | ICD-10-CM | POA: Diagnosis not present

## 2013-06-11 LAB — CBC WITH DIFFERENTIAL (CANCER CENTER ONLY)
BASO#: 0 10*3/uL (ref 0.0–0.2)
BASO%: 0.2 % (ref 0.0–2.0)
EOS%: 1.7 % (ref 0.0–7.0)
Eosinophils Absolute: 0.1 10*3/uL (ref 0.0–0.5)
HCT: 31.5 % — ABNORMAL LOW (ref 34.8–46.6)
HGB: 10.1 g/dL — ABNORMAL LOW (ref 11.6–15.9)
LYMPH#: 1.6 10*3/uL (ref 0.9–3.3)
LYMPH%: 37.9 % (ref 14.0–48.0)
MCH: 32.6 pg (ref 26.0–34.0)
MCHC: 32.1 g/dL (ref 32.0–36.0)
MCV: 102 fL — ABNORMAL HIGH (ref 81–101)
MONO#: 0.5 10*3/uL (ref 0.1–0.9)
MONO%: 11.6 % (ref 0.0–13.0)
NEUT#: 2.1 10*3/uL (ref 1.5–6.5)
NEUT%: 48.6 % (ref 39.6–80.0)
Platelets: 126 10*3/uL — ABNORMAL LOW (ref 145–400)
RBC: 3.1 10*6/uL — ABNORMAL LOW (ref 3.70–5.32)
RDW: 12.5 % (ref 11.1–15.7)
WBC: 4.2 10*3/uL (ref 3.9–10.0)

## 2013-06-11 LAB — TSH CHCC: TSH: 4.117 m(IU)/L — ABNORMAL HIGH (ref 0.308–3.960)

## 2013-06-11 LAB — FERRITIN CHCC: Ferritin: 172 ng/ml (ref 9–269)

## 2013-06-11 LAB — IRON AND TIBC CHCC
%SAT: 28 % (ref 21–57)
Iron: 113 ug/dL (ref 41–142)
TIBC: 401 ug/dL (ref 236–444)
UIBC: 289 ug/dL (ref 120–384)

## 2013-06-11 LAB — CHCC SATELLITE - SMEAR

## 2013-06-11 NOTE — Progress Notes (Signed)
Referral MD  Reason for Referral: Anemia   Chief Complaint  Patient presents with  . NEW PATIENT  : Patient cannot give as a reason why she is here  HPI: Sonya Moran is a very charming 38 year old special needs woman. She lives in a group home. She is on several medications. She has been noted to have some anemia. She is being followed by Dr. Criss Rosales. Criss Rosales has done some lab work. She had lab work done back in April of this year. She was found are white cell count 5.4. Hemoglobin 9.5. Hematocrit 29. Platelet count was 158. MCV is 96. She had a normal white cell differential.  She had an iron saturation of 8%. Her ferritin was 177.  She is was put on some oral iron.  Lab work done in May of this past year and shows white cell count of 4.3 hemoglobin 10.3 hematocrit 30.8 platelet count 146. MCV was 98. Pressure is normal BUN and creatinine. She has a protein 6.8 with an albumin of 3.9. Parachuted is on lithium. She is on Depakote. She had thyroid studies done and these were okay.  His been no obvious bleeding.  She has been eating okay. She is not a vegetarian. She's had no weight loss weight gain.  She comes with her caretaker. Her caretaker is incredibly helpful in providing information.   She's had no cough. She's had no nausea vomiting. She's had no rashes. She has some eczema which is a chronic.  There's been no obvious change in her medications.           Past Medical History  Diagnosis Date  . Mental retardation   . Post traumatic stress disorder   . Depressive disorder   . Psychotic disorder   :  Past Surgical History  Procedure Laterality Date  . Wrist surgery    . Back surgery    :  Current outpatient prescriptions:acetaminophen (TYLENOL) 500 MG tablet, Take 500 mg by mouth every 6 (six) hours as needed for pain., Disp: , Rfl: ;  Azelastine HCl (ASTEPRO) 0.15 % SOLN, Place into the nose 2 (two) times daily., Disp: , Rfl: ;  Beclomethasone Dipropionate (QNASL) 80  MCG/ACT AERS, Place 2 sprays into the nose daily. , Disp: , Rfl: ;  celecoxib (CELEBREX) 100 MG capsule, Take 100 mg by mouth daily., Disp: , Rfl:  cetirizine (ZYRTEC) 10 MG tablet, Take 10 mg by mouth every evening. , Disp: , Rfl: ;  divalproex (DEPAKOTE) 500 MG EC tablet, Take 500 mg by mouth 2 (two) times daily. Takes 566m every morning and takes 10070mat bedtime, Disp: , Rfl: ;  fenofibrate (TRICOR) 145 MG tablet, Take 145 mg by mouth daily.  , Disp: , Rfl: ;  FLUoxetine (PROZAC) 40 MG capsule, Take 40 mg by mouth daily.  , Disp: , Rfl:  Iron-FA-B Cmp-C-Biot-Probiotic (FUSION PLUS) CAPS, Take by mouth every morning., Disp: , Rfl: ;  lidocaine (XYLOCAINE) 2 % jelly, Apply 1 application topically every 8 (eight) hours as needed. pain, Disp: , Rfl: ;  LITHIUM CARBONATE PO, Take 450 mg by mouth 2 (two) times daily., Disp: , Rfl: ;  montelukast (SINGULAIR) 10 MG tablet, Take 10 mg by mouth at bedtime.  , Disp: , Rfl:  Olopatadine HCl (PATADAY) 0.2 % SOLN, Place 1 drop into both eyes daily., Disp: , Rfl: ;  omeprazole (PRILOSEC) 20 MG capsule, Take 20 mg by mouth daily. 2 caps daily as needed, Disp: , Rfl: ;  Psyllium (METAMUCIL PO),  Take 5 g by mouth at bedtime., Disp: , Rfl: ;  QUEtiapine (SEROQUEL) 300 MG tablet, Take 300 mg by mouth at bedtime., Disp: , Rfl:  sodium chloride (OCEAN) 0.65 % nasal spray, Place 1 spray into the nose 3 (three) times daily.  , Disp: , Rfl: ;  Vitamin D, Ergocalciferol, (DRISDOL) 50000 UNITS CAPS capsule, Take 50,000 Units by mouth every 7 (seven) days., Disp: , Rfl: :  :  Allergies  Allergen Reactions  . Grifulvin V [Griseofulvin] Anaphylaxis and Other (See Comments)    Arm swelling  :  No family history on file.:  History   Social History  . Marital Status: Single    Spouse Name: N/A    Number of Children: N/A  . Years of Education: N/A   Occupational History  . Not on file.   Social History Main Topics  . Smoking status: Never Smoker   . Smokeless  tobacco: Never Used     Comment: never used tobacco  . Alcohol Use: No  . Drug Use: Not on file  . Sexual Activity: Not on file   Other Topics Concern  . Not on file   Social History Narrative  . No narrative on file  :  Pertinent items are noted in HPI.  Exam: _0 @ well-developed and well-nourished white female. Her vital signs are temperature of 98.5. Pulse 70. Blood pressure 102/64. Weight is 174 pounds. Head and exam shows no ocular or oral lesions. There is no scleral icterus. She is pale conjunctiva. No adenopathy noted in neck. Lungs are clear bilaterally. Cardiac exam regular rate and rhythm with no murmurs rubs or bruits. Abdomen soft. Good bowel sounds. There is a palpable abdominal mass. There is no palpable liver or spleen tip. Extremities shows no clubbing cyanosis or edema. Neurological exam shows no focal neurological deficits. Skin exam shows some areas of eczema. Shows no petechia or ecchymoses.   Recent Labs  06/11/13 1128  WBC 4.2  HGB 10.1*  HCT 31.5*  PLT 126*   No results found for this basename: NA, K, CL, CO2, GLUCOSE, BUN, CREATININE, CALCIUM,  in the last 72 hours  Blood smear review: Minimal and the spherocytosis and poikilocytosis. There are no nucleated red cells. She has no microcytic red cells. There is no rouleau formation. I see no schistocytes or spherocytes. White cells per normal in morphology maturation. There is no immature myeloid or lymphoid forms. I do not see any hypersegmented polys. There are no blasts. Platelets are mildly decreased in number. Platelets are uniform in size and well-granulated.   Pathology:No data     Assessment and Plan: 38 year old white female. She is special-needs. She has some psychological issues. She is on numerous medications.  One would think that this anemia is mostly on deficiency. Her iron studies today show a ferritin of 172. Iron saturation is 28%. She is on oral iron so that we have to see how this  will do for her.  Her platelet count is a little depressed. I cannot find anything on her blood smear that could suggest this. I cannot palpate her spleen. I would have to suspect it is my. From one of her medications.  She is totally asymptomatic. I think we can just follow her along for right now. I would not do any thing different than what is already being done. At that we just have to see how the iron does for her.  I do not see any indication for a bone marrow  biopsy on her. I don't see any indication for any additional studies from a radiological perspective.  We will plan to get her back in about 6 weeks.  I spent a good hour with her and her caretaker. I explained my recommendations. Her caretaker and, who is a very very nice, certainly in has a good understanding.  Given the "big picture" I really think that we just need to be conservative and not get too overly aggressive with testing on Sonya Moran given a data that we have so far on her.

## 2013-06-11 NOTE — Telephone Encounter (Signed)
Spoke w NEW PATIENT today to remind them of their appointment with Dr. Marin Olp. Also, advised them to bring all medication bottles and insurance card information.  Pt is on the way.

## 2013-06-15 LAB — ERYTHROPOIETIN: Erythropoietin: 10.5 m[IU]/mL (ref 2.6–18.5)

## 2013-06-15 LAB — HEMOGLOBINOPATHY EVALUATION
Hemoglobin Other: 0 %
Hgb A2 Quant: 2.4 % (ref 2.2–3.2)
Hgb A: 97.6 % (ref 96.8–97.8)
Hgb F Quant: 0 % (ref 0.0–2.0)
Hgb S Quant: 0 %

## 2013-06-15 LAB — RETICULOCYTES (CHCC)
ABS Retic: 60.8 10*3/uL (ref 19.0–186.0)
RBC.: 3.2 MIL/uL — ABNORMAL LOW (ref 3.87–5.11)
Retic Ct Pct: 1.9 % (ref 0.4–2.3)

## 2013-06-15 LAB — HAPTOGLOBIN: Haptoglobin: 70 mg/dL (ref 45–215)

## 2013-07-23 ENCOUNTER — Ambulatory Visit (HOSPITAL_BASED_OUTPATIENT_CLINIC_OR_DEPARTMENT_OTHER): Payer: Medicare Other | Admitting: Family

## 2013-07-23 ENCOUNTER — Encounter: Payer: Self-pay | Admitting: Family

## 2013-07-23 ENCOUNTER — Other Ambulatory Visit (HOSPITAL_BASED_OUTPATIENT_CLINIC_OR_DEPARTMENT_OTHER): Payer: Medicare Other | Admitting: Lab

## 2013-07-23 VITALS — BP 101/61 | HR 77 | Temp 98.2°F | Resp 14 | Ht <= 58 in | Wt 172.0 lb

## 2013-07-23 DIAGNOSIS — D649 Anemia, unspecified: Secondary | ICD-10-CM

## 2013-07-23 DIAGNOSIS — B351 Tinea unguium: Secondary | ICD-10-CM

## 2013-07-23 LAB — CBC WITH DIFFERENTIAL (CANCER CENTER ONLY)
BASO#: 0 10*3/uL (ref 0.0–0.2)
BASO%: 0.4 % (ref 0.0–2.0)
EOS%: 1.2 % (ref 0.0–7.0)
Eosinophils Absolute: 0.1 10*3/uL (ref 0.0–0.5)
HCT: 29.8 % — ABNORMAL LOW (ref 34.8–46.6)
HGB: 9.7 g/dL — ABNORMAL LOW (ref 11.6–15.9)
LYMPH#: 1.7 10*3/uL (ref 0.9–3.3)
LYMPH%: 35.3 % (ref 14.0–48.0)
MCH: 32.8 pg (ref 26.0–34.0)
MCHC: 32.6 g/dL (ref 32.0–36.0)
MCV: 101 fL (ref 81–101)
MONO#: 0.5 10*3/uL (ref 0.1–0.9)
MONO%: 10.9 % (ref 0.0–13.0)
NEUT#: 2.5 10*3/uL (ref 1.5–6.5)
NEUT%: 52.2 % (ref 39.6–80.0)
Platelets: 125 10*3/uL — ABNORMAL LOW (ref 145–400)
RBC: 2.96 10*6/uL — ABNORMAL LOW (ref 3.70–5.32)
RDW: 12.7 % (ref 11.1–15.7)
WBC: 4.9 10*3/uL (ref 3.9–10.0)

## 2013-07-23 LAB — IRON AND TIBC CHCC
%SAT: 44 % (ref 21–57)
Iron: 166 ug/dL — ABNORMAL HIGH (ref 41–142)
TIBC: 379 ug/dL (ref 236–444)
UIBC: 213 ug/dL (ref 120–384)

## 2013-07-23 LAB — RETICULOCYTES (CHCC)
ABS Retic: 67.5 10*3/uL (ref 19.0–186.0)
RBC.: 3.07 MIL/uL — ABNORMAL LOW (ref 3.87–5.11)
Retic Ct Pct: 2.2 % (ref 0.4–2.3)

## 2013-07-23 LAB — FERRITIN CHCC: Ferritin: 215 ng/ml (ref 9–269)

## 2013-07-23 LAB — CHCC SATELLITE - SMEAR

## 2013-07-23 NOTE — Progress Notes (Signed)
[place]  Telephone:(336) [phone] [phone]) [phone]  ID: Sonya Moran OB: 1975-05-27 MR#: [id number]#:[id number] Patient Care Team: Lucianne Lei, MD as PCP - General (Family Medicine)  DIAGNOSIS: anemia, unspecified  INTERVAL HISTORY: Sonya Moran is a very pleasant 38 year old special needs woman. She lives in a group home and is on numerous medications. She has been noted to have some anemia. She is being followed by Dr. Criss Rosales. She had lab work done back in April of this year. She was found are white cell count 5.4. Hemoglobin 9.5. Hematocrit 29. Platelet count was 158. MCV is 96. She had a normal white cell differential.  She had an iron saturation of 8%. Her ferritin was 177. She is was put on oral iron. She states that her energy level is "great". She denies headaches, dizziness, SOB, chest pain, palpitations, abdominal pain, constipation, diarrhea, problems urinating, blood in her urine or stool. She denies, cough, rash or swelling, tenderness, numbness or tingling in her extremities. She says her appetite is good.   CURRENT TREATMENT: Iron-FA-B Cmp-C-Biot-Probiotic (FUSION PLUS) CAPS QD, with breakfast  REVIEW OF SYSTEMS: All other 10 pt review of systems is negative.   PAST MEDICAL HISTORY: Past Medical History  Diagnosis Date  . Mental retardation   . Post traumatic stress disorder   . Depressive disorder   . Psychotic disorder    PAST SURGICAL HISTORY: Past Surgical History  Procedure Laterality Date  . Wrist surgery    . Back surgery     FAMILY HISTORY No family history on file.  GYNECOLOGIC HISTORY:  No LMP recorded. Patient has had an injection.   SOCIAL HISTORY:  History   Social History  . Marital Status: Single    Spouse Name: N/A    Number of Children: N/A  . Years of Education: N/A   Occupational History  . Not on file.   Social History Main Topics  . Smoking status: Never Smoker   . Smokeless tobacco: Never Used     Comment: never  used tobacco  . Alcohol Use: No  . Drug Use: Not on file  . Sexual Activity: Not on file   Other Topics Concern  . Not on file   Social History Narrative  . No narrative on file   ADVANCED DIRECTIVES: <no information>  HEALTH MAINTENANCE: History  Substance Use Topics  . Smoking status: Never Smoker   . Smokeless tobacco: Never Used     Comment: never used tobacco  . Alcohol Use: No   Colonoscopy: PAP: Bone density: Lipid panel:  Allergies  Allergen Reactions  . Grifulvin V [Griseofulvin] Anaphylaxis and Other (See Comments)    Arm swelling   Current Outpatient Prescriptions  Medication Sig Dispense Refill  . acetaminophen (TYLENOL) 500 MG tablet Take 500 mg by mouth every 6 (six) hours as needed for pain.      . Azelastine HCl (ASTEPRO) 0.15 % SOLN Place into the nose 2 (two) times daily.      . Beclomethasone Dipropionate (QNASL) 80 MCG/ACT AERS Place 2 sprays into the nose daily.       . celecoxib (CELEBREX) 100 MG capsule Take 100 mg by mouth daily.      . cetirizine (ZYRTEC) 10 MG tablet Take 10 mg by mouth every evening.       . divalproex (DEPAKOTE) 500 MG EC tablet Take 500 mg by mouth 2 (two) times daily. Takes 500mg  every morning and takes 1000mg  at bedtime      .  fenofibrate (TRICOR) 145 MG tablet Take 145 mg by mouth daily.        Marland Kitchen FLUoxetine (PROZAC) 40 MG capsule Take 40 mg by mouth daily.        . Iron-FA-B Cmp-C-Biot-Probiotic (FUSION PLUS) CAPS Take by mouth every morning.      Marland Kitchen LITHIUM CARBONATE PO Take 450 mg by mouth 2 (two) times daily.      . montelukast (SINGULAIR) 10 MG tablet Take 10 mg by mouth at bedtime.        Marland Kitchen omeprazole (PRILOSEC) 20 MG capsule Take 20 mg by mouth daily. 2 caps daily as needed      . Psyllium (METAMUCIL PO) Take 5 g by mouth at bedtime.      Marland Kitchen QUEtiapine (SEROQUEL) 300 MG tablet Take 300 mg by mouth at bedtime.      . Vitamin D, Ergocalciferol, (DRISDOL) 50000 UNITS CAPS capsule Take 50,000 Units by mouth every 7  (seven) days.      Marland Kitchen lidocaine (XYLOCAINE) 2 % jelly Apply 1 application topically every 8 (eight) hours as needed. pain      . Olopatadine HCl (PATADAY) 0.2 % SOLN Place 1 drop into both eyes daily.      . sodium chloride (OCEAN) 0.65 % nasal spray Place 1 spray into the nose 3 (three) times daily.         No current facility-administered medications for this visit.   OBJECTIVE: Filed Vitals:   07/23/13 0908  BP: 101/61  Pulse: 77  Temp: 98.2 F (36.8 C)  Resp: 14   Body mass index is 35.96 kg/(m^2). ECOG FS:0 - Asymptomatic Ocular: Sclerae unicteric, pupils equal, round and reactive to light Ear-nose-throat: Oropharynx clear, dentition fair Lymphatic: No cervical or supraclavicular adenopathy Lungs no rales or rhonchi, good excursion bilaterally Heart regular rate and rhythm, no murmur appreciated Abd soft, nontender, positive bowel sounds MSK no focal spinal tenderness, no joint edema Neuro: non-focal, well-oriented, appropriate affect Breasts: Deferred  LAB RESULTS: CMP     Component Value Date/Time   NA 137 10/10/2010 1126   K 4.0 10/10/2010 1126   CL 103 10/10/2010 1126   CO2 25 10/10/2010 1126   GLUCOSE 93 10/10/2010 1126   BUN 5* 10/10/2010 1126   CREATININE 0.76 10/10/2010 1126   CALCIUM 9.5 10/10/2010 1126   PROT 6.9 09/26/2010 1735   ALBUMIN 3.2* 09/26/2010 1735   AST 48* 09/26/2010 1735   ALT 39* 09/26/2010 1735   ALKPHOS 47 09/26/2010 1735   BILITOT 0.3 09/26/2010 1735   GFRNONAA >60 10/10/2010 1126   GFRAA >60 10/10/2010 1126   No results found for this basename: SPEP, UPEP,  kappa and lambda light chains   Lab Results  Component Value Date   WBC 4.9 07/23/2013   NEUTROABS 2.5 07/23/2013   HGB 9.7* 07/23/2013   HCT 29.8* 07/23/2013   MCV 101 07/23/2013   PLT 125* 07/23/2013   No results found for this basename: LABCA2   No components found with this basename: [id number]   No results found for this basename: INR,  in the last 168 hours  STUDIES: No results  found.  ASSESSMENT/PLAN: The patient is a 38 year old white female. She is special-needs and has some psychological issues. She is on numerous medications. One would think that this anemia is mostly on deficiency. Her iron studies in May show a ferritin of 172. Iron saturation is 28%. She is currently on oral iron.  Her platelet count is still a little depressed.  It is possible her anemia is caused by one of her many medications that she is on. She is totally asymptomatic. At this time she will stay on her daily iron and we will continue to follow her closely. I discussed her CBC with her and her caregiver. Her other labs are still pending. We will call with the results and decide at that time if she requires a change in treatment or if she will remain on oral iron.  We will see her again in 6 weeks for labs and a follow-up appointment.   I discussed the plan with her and her caregiver. Questions were answered. Her caretaker is very nice and seems to have a good understanding.   They understand to call here with any questions or concerns and to go to the ED in the event of an emergency. We can certainly see her sooner if need be.   Eliezer Bottom, NP 07/23/2013 9:51 AM

## 2013-08-19 DIAGNOSIS — J309 Allergic rhinitis, unspecified: Secondary | ICD-10-CM | POA: Diagnosis not present

## 2013-08-19 DIAGNOSIS — J310 Chronic rhinitis: Secondary | ICD-10-CM | POA: Diagnosis not present

## 2013-08-24 DIAGNOSIS — Z3049 Encounter for surveillance of other contraceptives: Secondary | ICD-10-CM | POA: Diagnosis not present

## 2013-08-26 DIAGNOSIS — F6381 Intermittent explosive disorder: Secondary | ICD-10-CM | POA: Diagnosis not present

## 2013-08-26 DIAGNOSIS — F39 Unspecified mood [affective] disorder: Secondary | ICD-10-CM | POA: Diagnosis not present

## 2013-08-26 DIAGNOSIS — F70 Mild intellectual disabilities: Secondary | ICD-10-CM | POA: Diagnosis not present

## 2013-08-26 DIAGNOSIS — F71 Moderate intellectual disabilities: Secondary | ICD-10-CM | POA: Diagnosis not present

## 2013-08-26 DIAGNOSIS — F259 Schizoaffective disorder, unspecified: Secondary | ICD-10-CM | POA: Diagnosis not present

## 2013-09-01 ENCOUNTER — Encounter: Payer: Self-pay | Admitting: Hematology & Oncology

## 2013-09-01 ENCOUNTER — Other Ambulatory Visit (HOSPITAL_BASED_OUTPATIENT_CLINIC_OR_DEPARTMENT_OTHER): Payer: Medicare Other | Admitting: Lab

## 2013-09-01 ENCOUNTER — Ambulatory Visit (HOSPITAL_BASED_OUTPATIENT_CLINIC_OR_DEPARTMENT_OTHER): Payer: Medicare Other | Admitting: Hematology & Oncology

## 2013-09-01 VITALS — BP 112/64 | HR 76 | Temp 98.6°F | Resp 20 | Ht 60.0 in | Wt 172.0 lb

## 2013-09-01 DIAGNOSIS — D649 Anemia, unspecified: Secondary | ICD-10-CM | POA: Diagnosis not present

## 2013-09-01 DIAGNOSIS — B351 Tinea unguium: Secondary | ICD-10-CM

## 2013-09-01 LAB — CBC WITH DIFFERENTIAL (CANCER CENTER ONLY)
BASO#: 0 10*3/uL (ref 0.0–0.2)
BASO%: 0.2 % (ref 0.0–2.0)
EOS%: 1.6 % (ref 0.0–7.0)
Eosinophils Absolute: 0.1 10*3/uL (ref 0.0–0.5)
HCT: 31 % — ABNORMAL LOW (ref 34.8–46.6)
HGB: 9.8 g/dL — ABNORMAL LOW (ref 11.6–15.9)
LYMPH#: 1.5 10*3/uL (ref 0.9–3.3)
LYMPH%: 32.6 % (ref 14.0–48.0)
MCH: 32.7 pg (ref 26.0–34.0)
MCHC: 31.6 g/dL — ABNORMAL LOW (ref 32.0–36.0)
MCV: 103 fL — ABNORMAL HIGH (ref 81–101)
MONO#: 0.4 10*3/uL (ref 0.1–0.9)
MONO%: 9.1 % (ref 0.0–13.0)
NEUT#: 2.6 10*3/uL (ref 1.5–6.5)
NEUT%: 56.5 % (ref 39.6–80.0)
Platelets: 118 10*3/uL — ABNORMAL LOW (ref 145–400)
RBC: 3 10*6/uL — ABNORMAL LOW (ref 3.70–5.32)
RDW: 12.7 % (ref 11.1–15.7)
WBC: 4.5 10*3/uL (ref 3.9–10.0)

## 2013-09-01 LAB — IRON AND TIBC CHCC
%SAT: 48 % (ref 21–57)
Iron: 190 ug/dL — ABNORMAL HIGH (ref 41–142)
TIBC: 393 ug/dL (ref 236–444)
UIBC: 202 ug/dL (ref 120–384)

## 2013-09-01 LAB — RETICULOCYTES (CHCC)
ABS Retic: 54.9 10*3/uL (ref 19.0–186.0)
RBC.: 3.05 MIL/uL — ABNORMAL LOW (ref 3.87–5.11)
Retic Ct Pct: 1.8 % (ref 0.4–2.3)

## 2013-09-01 LAB — CHCC SATELLITE - SMEAR

## 2013-09-01 LAB — FERRITIN CHCC: Ferritin: 201 ng/ml (ref 9–269)

## 2013-09-01 NOTE — Progress Notes (Signed)
Hematology and Oncology Follow Up Visit  Sonya Moran [id number] May 04, 1975 38 y.o. 09/01/2013   Principle Diagnosis:   Anemia, likely multi-factorial  Current Therapy:    Observation     Interim History:  Sonya Moran is back for followup. We first saw Sonya Moran back in June. At that point on, she did have a normal iron saturation. Sonya Moran iron saturation was 28%. Sonya Moran ferritin was 172. We did have Sonya Moran on some oral iron supplements.  She had a normal hemoglobin electrophoresis. She had a normal haptoglobin level. Sonya Moran TSH was okay.  What we found that was incredibly low was a erythropoietin level of only 10.5. I suspect that because of Sonya Moran medications, she has a low erythropoietin response.  She is totally asymptomatic. She's doing what she likes to do. She is eating well. She's having no problems bowels or bladder. She's had no obvious bleeding. She does not have monthly cycles because of a Provera.  She's had no cough. There've been no rashes. There has been no change in medications.  Medications: Current outpatient prescriptions:acetaminophen (TYLENOL) 500 MG tablet, Take 500 mg by mouth every 6 (six) hours as needed for pain., Disp: , Rfl: ;  Azelastine HCl (ASTEPRO) 0.15 % SOLN, Place into the nose 2 (two) times daily., Disp: , Rfl: ;  Beclomethasone Dipropionate (QNASL) 80 MCG/ACT AERS, Place 2 sprays into the nose daily. , Disp: , Rfl: ;  celecoxib (CELEBREX) 100 MG capsule, Take 100 mg by mouth daily., Disp: , Rfl:  divalproex (DEPAKOTE) 500 MG EC tablet, Take 500 mg by mouth 2 (two) times daily. Takes 500mg  every morning and takes 1000mg  at bedtime, Disp: , Rfl: ;  fenofibrate (TRICOR) 145 MG tablet, Take 145 mg by mouth daily.  , Disp: , Rfl: ;  Fexofenadine HCl (ALLEGRA PO), Take by mouth at bedtime., Disp: , Rfl: ;  FLUoxetine (PROZAC) 40 MG capsule, Take 40 mg by mouth daily.  , Disp: , Rfl:  Iron-FA-B Cmp-C-Biot-Probiotic (FUSION PLUS) CAPS, Take by mouth every morning., Disp: , Rfl: ;   lidocaine (XYLOCAINE) 2 % jelly, Apply 1 application topically every 8 (eight) hours as needed. pain, Disp: , Rfl: ;  LITHIUM CARBONATE PO, Take 450 mg by mouth 2 (two) times daily., Disp: , Rfl: ;  montelukast (SINGULAIR) 10 MG tablet, Take 10 mg by mouth at bedtime.  , Disp: , Rfl:  Olopatadine HCl (PATADAY) 0.2 % SOLN, Place 1 drop into both eyes daily., Disp: , Rfl: ;  omeprazole (PRILOSEC) 20 MG capsule, Take 20 mg by mouth daily. 2 caps daily as needed, Disp: , Rfl: ;  Psyllium (METAMUCIL PO), Take 5 g by mouth at bedtime., Disp: , Rfl: ;  QUEtiapine (SEROQUEL) 300 MG tablet, Take 300 mg by mouth at bedtime., Disp: , Rfl:  sodium chloride (OCEAN) 0.65 % nasal spray, Place 1 spray into the nose 3 (three) times daily.  , Disp: , Rfl: ;  Vitamin D, Ergocalciferol, (DRISDOL) 50000 UNITS CAPS capsule, Take 50,000 Units by mouth every 7 (seven) days., Disp: , Rfl:   Allergies:  Allergies  Allergen Reactions  . Grifulvin V [Griseofulvin] Anaphylaxis and Other (See Comments)    Arm swelling    Past Medical History, Surgical history, Social history, and Family History were reviewed and updated.  Review of Systems: As above  Physical Exam:  height is 5' (1.524 m) and weight is 172 lb (78.019 kg). Sonya Moran oral temperature is 98.6 F (37 C). Sonya Moran blood pressure is 112/64 and  Sonya Moran pulse is 76. Sonya Moran respiration is 20.   Well-developed and well-nourished white female. Head and neck exam shows no ocular or oral lesions. She has no palpable cervical or supraclavicular lymph nodes. Lungs are clear. Cardiac exam regular in rhythm. Abdomen is soft. She has good bowel sounds. There is no fluid wave. There is no palpable liver or spleen tip. Back exam shows no tenderness over the spine ribs or hips. Extremities shows no clubbing, cyanosis or edema. Neurological exam shows no focal neurological deficits.  Lab Results  Component Value Date   WBC 4.5 09/01/2013   HGB 9.8* 09/01/2013   HCT 31.0* 09/01/2013   MCV 103*  09/01/2013   PLT 118* 09/01/2013     Chemistry      Component Value Date/Time   NA 137 10/10/2010 1126   K 4.0 10/10/2010 1126   CL 103 10/10/2010 1126   CO2 25 10/10/2010 1126   BUN 5* 10/10/2010 1126   CREATININE 0.76 10/10/2010 1126      Component Value Date/Time   CALCIUM 9.5 10/10/2010 1126   ALKPHOS 47 09/26/2010 1735   AST 48* 09/26/2010 1735   ALT 39* 09/26/2010 1735   BILITOT 0.3 09/26/2010 1735      Sonya Moran blood smear shows some microcytic red cells. She has some target cells. There are no nucleated red cells. I see no teardrop cells. She has no rouleau formation. White cells are mature. I see no hypersegmented polys. There are no immature myeloid cells. There are no atypical lymphocytes. Platelets are adequate in number and size.  Impression and Plan: Sonya Moran is 38 year old white female. She is mentally challenged. She is incredibly nice.  Again, I think that the problem is that she has a very low erythropoietin level. However, she is not symptomatic. I would just hold off on giving Sonya Moran any ESA . I just don't the this would help with Sonya Moran quality of life.  Sonya Moran blood smear does not look suspicious for a hematologic malignancy. I don't think we have to do a bone marrow test.  For now, I still am willing to watch Sonya Moran. Again I do not see anything that looks suspicious.  I will send off a genetic test for thalassemia.   Volanda Napoleon, MD 8/19/20156:21 PM

## 2013-10-12 DIAGNOSIS — M542 Cervicalgia: Secondary | ICD-10-CM | POA: Diagnosis not present

## 2013-10-12 DIAGNOSIS — S0990XA Unspecified injury of head, initial encounter: Secondary | ICD-10-CM | POA: Diagnosis not present

## 2013-10-12 DIAGNOSIS — Z23 Encounter for immunization: Secondary | ICD-10-CM | POA: Diagnosis not present

## 2013-10-12 DIAGNOSIS — W1809XA Striking against other object with subsequent fall, initial encounter: Secondary | ICD-10-CM | POA: Diagnosis not present

## 2013-10-12 DIAGNOSIS — S0993XA Unspecified injury of face, initial encounter: Secondary | ICD-10-CM | POA: Diagnosis not present

## 2013-10-12 DIAGNOSIS — R51 Headache: Secondary | ICD-10-CM | POA: Diagnosis not present

## 2013-10-12 DIAGNOSIS — S0100XA Unspecified open wound of scalp, initial encounter: Secondary | ICD-10-CM | POA: Diagnosis not present

## 2013-10-12 DIAGNOSIS — S199XXA Unspecified injury of neck, initial encounter: Secondary | ICD-10-CM | POA: Diagnosis not present

## 2013-10-19 DIAGNOSIS — Z4802 Encounter for removal of sutures: Secondary | ICD-10-CM | POA: Diagnosis not present

## 2013-10-29 ENCOUNTER — Other Ambulatory Visit (HOSPITAL_BASED_OUTPATIENT_CLINIC_OR_DEPARTMENT_OTHER): Payer: Medicare Other | Admitting: Lab

## 2013-10-29 ENCOUNTER — Ambulatory Visit (HOSPITAL_BASED_OUTPATIENT_CLINIC_OR_DEPARTMENT_OTHER): Payer: Medicare Other | Admitting: Family

## 2013-10-29 VITALS — BP 93/66 | HR 72 | Temp 98.1°F | Resp 16 | Wt 174.0 lb

## 2013-10-29 DIAGNOSIS — D649 Anemia, unspecified: Secondary | ICD-10-CM

## 2013-10-29 LAB — CBC WITH DIFFERENTIAL (CANCER CENTER ONLY)
BASO#: 0 10*3/uL (ref 0.0–0.2)
BASO%: 0.3 % (ref 0.0–2.0)
EOS%: 1.6 % (ref 0.0–7.0)
Eosinophils Absolute: 0.1 10*3/uL (ref 0.0–0.5)
HCT: 29.7 % — ABNORMAL LOW (ref 34.8–46.6)
HGB: 9.4 g/dL — ABNORMAL LOW (ref 11.6–15.9)
LYMPH#: 1.2 10*3/uL (ref 0.9–3.3)
LYMPH%: 31.1 % (ref 14.0–48.0)
MCH: 33.3 pg (ref 26.0–34.0)
MCHC: 31.6 g/dL — ABNORMAL LOW (ref 32.0–36.0)
MCV: 105 fL — ABNORMAL HIGH (ref 81–101)
MONO#: 0.5 10*3/uL (ref 0.1–0.9)
MONO%: 12.3 % (ref 0.0–13.0)
NEUT#: 2.1 10*3/uL (ref 1.5–6.5)
NEUT%: 54.7 % (ref 39.6–80.0)
Platelets: 105 10*3/uL — ABNORMAL LOW (ref 145–400)
RBC: 2.82 10*6/uL — ABNORMAL LOW (ref 3.70–5.32)
RDW: 12.4 % (ref 11.1–15.7)
WBC: 3.8 10*3/uL — ABNORMAL LOW (ref 3.9–10.0)

## 2013-10-29 LAB — CHCC SATELLITE - SMEAR

## 2013-10-29 LAB — CMP (CANCER CENTER ONLY)
ALT(SGPT): 34 U/L (ref 10–47)
AST: 55 U/L — ABNORMAL HIGH (ref 11–38)
Albumin: 3.2 g/dL — ABNORMAL LOW (ref 3.3–5.5)
Alkaline Phosphatase: 37 U/L (ref 26–84)
BUN, Bld: 8 mg/dL (ref 7–22)
CO2: 24 mEq/L (ref 18–33)
Calcium: 9.4 mg/dL (ref 8.0–10.3)
Chloride: 108 mEq/L (ref 98–108)
Creat: 0.9 mg/dl (ref 0.6–1.2)
Glucose, Bld: 110 mg/dL (ref 73–118)
Potassium: 3.5 mEq/L (ref 3.3–4.7)
Sodium: 137 mEq/L (ref 128–145)
Total Bilirubin: 0.8 mg/dl (ref 0.20–1.60)
Total Protein: 6.6 g/dL (ref 6.4–8.1)

## 2013-10-29 LAB — IRON AND TIBC CHCC
%SAT: 47 % (ref 21–57)
Iron: 174 ug/dL — ABNORMAL HIGH (ref 41–142)
TIBC: 371 ug/dL (ref 236–444)
UIBC: 198 ug/dL (ref 120–384)

## 2013-10-29 LAB — FERRITIN CHCC: Ferritin: 191 ng/ml (ref 9–269)

## 2013-10-29 NOTE — Progress Notes (Signed)
[place]  [phone] [phone]  ID: [name] OB: 03-17-1975 MR#: [id number] INO#:[id number] Patient Care Team: Lucianne Lei, MD as PCP - General (Family Medicine)  DIAGNOSIS: anemia, unspecified  INTERVAL HISTORY: Ms. Sonya Moran is here today for a follow-up. She is feeling good. She denies headaches, dizziness, SOB, chest pain, palpitations, abdominal pain, constipation, diarrhea, problems urinating, blood in her urine or stool. She denies, cough, rash or swelling, tenderness, numbness or tingling in her extremities. Her appetite is good and she is well hydrated. She is still taking her iron pill daily. In August her ferritin was 201 and iron saturation was 48%. Her platelets continue to drop  At 105 Hgb 9.4 and WBC 3.8. We will watch this closely. She may need a bone marrow biopsy at some point.   CURRENT TREATMENT: Iron-FA-B Cmp-C-Biot-Probiotic (FUSION PLUS) CAPS QD, with breakfast  REVIEW OF SYSTEMS: All other 10 pt review of systems is negative.   PAST MEDICAL HISTORY: Past Medical History  Diagnosis Date  . Mental retardation   . Post traumatic stress disorder   . Depressive disorder   . Psychotic disorder    PAST SURGICAL HISTORY: Past Surgical History  Procedure Laterality Date  . Wrist surgery    . Back surgery     FAMILY HISTORY No family history on file.  GYNECOLOGIC HISTORY:  No LMP recorded. Patient has had an injection.   SOCIAL HISTORY:  History   Social History  . Marital Status: Single    Spouse Name: N/A    Number of Children: N/A  . Years of Education: N/A   Occupational History  . Not on file.   Social History Main Topics  . Smoking status: Never Smoker   . Smokeless tobacco: Never Used     Comment: never used tobacco  . Alcohol Use: No  . Drug Use: Not on file  . Sexual Activity: Not on file   Other Topics Concern  . Not on file   Social History Narrative  . No narrative on file   ADVANCED DIRECTIVES:  <no information>  HEALTH MAINTENANCE: History  Substance Use Topics  . Smoking status: Never Smoker   . Smokeless tobacco: Never Used     Comment: never used tobacco  . Alcohol Use: No   Colonoscopy: PAP: Bone density: Lipid panel:  Allergies  Allergen Reactions  . Grifulvin V [Griseofulvin] Anaphylaxis and Other (See Comments)    Arm swelling   Current Outpatient Prescriptions  Medication Sig Dispense Refill  . acetaminophen (TYLENOL) 500 MG tablet Take 500 mg by mouth every 6 (six) hours as needed for pain.      . Azelastine HCl (ASTEPRO) 0.15 % SOLN Place into the nose 2 (two) times daily.      . Beclomethasone Dipropionate (QNASL) 80 MCG/ACT AERS Place 2 sprays into the nose daily.       . celecoxib (CELEBREX) 100 MG capsule Take 100 mg by mouth daily.      . divalproex (DEPAKOTE) 500 MG EC tablet Take 500 mg by mouth 2 (two) times daily. Takes 573m every morning and takes 10072mat bedtime      . fenofibrate (TRICOR) 145 MG tablet Take 145 mg by mouth daily.        . Marland Kitchenexofenadine HCl (ALLEGRA PO) Take by mouth at bedtime.      . Marland KitchenLUoxetine (PROZAC) 40 MG capsule Take 40 mg by mouth daily.        . Iron-FA-B Cmp-C-Biot-Probiotic (FUSION  PLUS) CAPS Take by mouth every morning.      . lidocaine (XYLOCAINE) 2 % jelly Apply 1 application topically every 8 (eight) hours as needed. pain      . LITHIUM CARBONATE PO Take 450 mg by mouth 2 (two) times daily.      . montelukast (SINGULAIR) 10 MG tablet Take 10 mg by mouth at bedtime.        . Olopatadine HCl (PATADAY) 0.2 % SOLN Place 1 drop into both eyes daily.      Marland Kitchen omeprazole (PRILOSEC) 20 MG capsule Take 20 mg by mouth daily. 2 caps daily as needed      . Psyllium (METAMUCIL PO) Take 5 g by mouth at bedtime.      Marland Kitchen QUEtiapine (SEROQUEL) 300 MG tablet Take 300 mg by mouth at bedtime.      . sodium chloride (OCEAN) 0.65 % nasal spray Place 1 spray into the nose 3 (three) times daily.        . Vitamin D, Ergocalciferol, (DRISDOL)  50000 UNITS CAPS capsule Take 50,000 Units by mouth every 7 (seven) days.       No current facility-administered medications for this visit.   OBJECTIVE: Filed Vitals:   10/29/13 0941  BP: 93/66  Pulse: 72  Temp: 98.1 F (36.7 C)  Resp: 16   Body mass index is 33.98 kg/(m^2). ECOG FS:0 - Asymptomatic Ocular: Sclerae unicteric, pupils equal, round and reactive to light Ear-nose-throat: Oropharynx clear, dentition fair Lymphatic: No cervical or supraclavicular adenopathy Lungs no rales or rhonchi, good excursion bilaterally Heart regular rate and rhythm, no murmur appreciated Abd soft, nontender, positive bowel sounds MSK no focal spinal tenderness, no joint edema Neuro: non-focal, well-oriented, appropriate affect Breasts: Deferred  LAB RESULTS: CMP     Component Value Date/Time   NA 137 10/29/2013 0936   NA 137 10/10/2010 1126   K 3.5 10/29/2013 0936   K 4.0 10/10/2010 1126   CL 108 10/29/2013 0936   CL 103 10/10/2010 1126   CO2 24 10/29/2013 0936   CO2 25 10/10/2010 1126   GLUCOSE 110 10/29/2013 0936   GLUCOSE 93 10/10/2010 1126   BUN 8 10/29/2013 0936   BUN 5* 10/10/2010 1126   CREATININE 0.9 10/29/2013 0936   CREATININE 0.76 10/10/2010 1126   CALCIUM 9.4 10/29/2013 0936   CALCIUM 9.5 10/10/2010 1126   PROT 6.6 10/29/2013 0936   PROT 6.9 09/26/2010 1735   ALBUMIN 3.2* 09/26/2010 1735   AST 55* 10/29/2013 0936   AST 48* 09/26/2010 1735   ALT 34 10/29/2013 0936   ALT 39* 09/26/2010 1735   ALKPHOS 37 10/29/2013 0936   ALKPHOS 47 09/26/2010 1735   BILITOT 0.80 10/29/2013 0936   BILITOT 0.3 09/26/2010 1735   GFRNONAA >60 10/10/2010 1126   GFRAA >60 10/10/2010 1126   No results found for this basename: SPEP,  UPEP,   kappa and lambda light chains   Lab Results  Component Value Date   WBC 3.8* 10/29/2013   NEUTROABS 2.1 10/29/2013   HGB 9.4* 10/29/2013   HCT 29.7* 10/29/2013   MCV 105* 10/29/2013   PLT 105* 10/29/2013   No results found for this basename: LABCA2   No  components found with this basename: LABCA125   No results found for this basename: INR,  in the last 168 hours  STUDIES: No results found.  ASSESSMENT/PLAN: The patient is a 38 year old white female with multifactorial anemia. She is special-needs and has some psychological issues. She is  on numerous medications. One would think that this anemia is mostly iron deficiency. In August ferritin of 201 and iron saturation 48%. She is currently on oral iron.  We will continue to monitor her platelet and WBC as they continue to decrease. She will more than likely need a bone marrow biopsy at some point.  We will stop the oral iron for now.  We will see her again in 6 weeks for labs and a follow-up appointment.  I discussed the plan with her and her caregiver. Questions were answered. Her caretaker is very nice and seems to have a good understanding.  They know to call with any questions or concerns and to go to the ED in the event of an emergency. We can certainly see her sooner if need be.   Eliezer Bottom, NP 10/29/2013 11:11 AM

## 2013-11-08 LAB — RETICULOCYTES (CHCC)
ABS Retic: 66.2 10*3/uL (ref 19.0–186.0)
RBC.: 2.88 MIL/uL — ABNORMAL LOW (ref 3.87–5.11)
Retic Ct Pct: 2.3 % (ref 0.4–2.3)

## 2013-11-08 LAB — ALPHA-THALASSEMIA GENOTYPR

## 2013-11-09 DIAGNOSIS — Z3042 Encounter for surveillance of injectable contraceptive: Secondary | ICD-10-CM | POA: Diagnosis not present

## 2013-11-30 ENCOUNTER — Telehealth: Payer: Self-pay | Admitting: Hematology & Oncology

## 2013-11-30 NOTE — Telephone Encounter (Signed)
Caregiver moved 11-27 to 12-15

## 2013-12-07 DIAGNOSIS — J029 Acute pharyngitis, unspecified: Secondary | ICD-10-CM | POA: Diagnosis not present

## 2013-12-07 DIAGNOSIS — F2089 Other schizophrenia: Secondary | ICD-10-CM | POA: Diagnosis not present

## 2013-12-07 DIAGNOSIS — R070 Pain in throat: Secondary | ICD-10-CM | POA: Diagnosis not present

## 2013-12-07 DIAGNOSIS — K649 Unspecified hemorrhoids: Secondary | ICD-10-CM | POA: Diagnosis not present

## 2013-12-10 ENCOUNTER — Other Ambulatory Visit: Payer: Medicare Other | Admitting: Lab

## 2013-12-10 ENCOUNTER — Ambulatory Visit: Payer: Medicare Other | Admitting: Hematology & Oncology

## 2013-12-23 DIAGNOSIS — G4733 Obstructive sleep apnea (adult) (pediatric): Secondary | ICD-10-CM | POA: Diagnosis not present

## 2013-12-23 DIAGNOSIS — F70 Mild intellectual disabilities: Secondary | ICD-10-CM | POA: Diagnosis not present

## 2013-12-23 DIAGNOSIS — F251 Schizoaffective disorder, depressive type: Secondary | ICD-10-CM | POA: Diagnosis not present

## 2013-12-28 ENCOUNTER — Encounter: Payer: Self-pay | Admitting: Hematology & Oncology

## 2013-12-28 ENCOUNTER — Other Ambulatory Visit (HOSPITAL_BASED_OUTPATIENT_CLINIC_OR_DEPARTMENT_OTHER): Payer: Medicare Other | Admitting: Lab

## 2013-12-28 ENCOUNTER — Ambulatory Visit (HOSPITAL_BASED_OUTPATIENT_CLINIC_OR_DEPARTMENT_OTHER): Payer: Medicare Other | Admitting: Hematology & Oncology

## 2013-12-28 VITALS — BP 107/65 | HR 81 | Temp 99.3°F | Resp 16 | Ht 60.0 in | Wt 173.0 lb

## 2013-12-28 DIAGNOSIS — D500 Iron deficiency anemia secondary to blood loss (chronic): Secondary | ICD-10-CM

## 2013-12-28 DIAGNOSIS — D649 Anemia, unspecified: Secondary | ICD-10-CM

## 2013-12-28 LAB — COMPREHENSIVE METABOLIC PANEL
ALT: 31 U/L (ref 0–35)
AST: 56 U/L — ABNORMAL HIGH (ref 0–37)
Albumin: 3.8 g/dL (ref 3.5–5.2)
Alkaline Phosphatase: 44 U/L (ref 39–117)
BUN: 12 mg/dL (ref 6–23)
CO2: 25 mEq/L (ref 19–32)
Calcium: 9.3 mg/dL (ref 8.4–10.5)
Chloride: 108 mEq/L (ref 96–112)
Creatinine, Ser: 0.77 mg/dL (ref 0.50–1.10)
Glucose, Bld: 74 mg/dL (ref 70–99)
Potassium: 4.2 mEq/L (ref 3.5–5.3)
Sodium: 138 mEq/L (ref 135–145)
Total Bilirubin: 0.8 mg/dL (ref 0.2–1.2)
Total Protein: 6.9 g/dL (ref 6.0–8.3)

## 2013-12-28 LAB — CBC WITH DIFFERENTIAL (CANCER CENTER ONLY)
BASO#: 0 10*3/uL (ref 0.0–0.2)
BASO%: 0.2 % (ref 0.0–2.0)
EOS%: 1.6 % (ref 0.0–7.0)
Eosinophils Absolute: 0.1 10*3/uL (ref 0.0–0.5)
HCT: 29.5 % — ABNORMAL LOW (ref 34.8–46.6)
HGB: 9.4 g/dL — ABNORMAL LOW (ref 11.6–15.9)
LYMPH#: 2 10*3/uL (ref 0.9–3.3)
LYMPH%: 39.6 % (ref 14.0–48.0)
MCH: 33.3 pg (ref 26.0–34.0)
MCHC: 31.9 g/dL — ABNORMAL LOW (ref 32.0–36.0)
MCV: 105 fL — ABNORMAL HIGH (ref 81–101)
MONO#: 0.6 10*3/uL (ref 0.1–0.9)
MONO%: 11.3 % (ref 0.0–13.0)
NEUT#: 2.3 10*3/uL (ref 1.5–6.5)
NEUT%: 47.3 % (ref 39.6–80.0)
Platelets: 109 10*3/uL — ABNORMAL LOW (ref 145–400)
RBC: 2.82 10*6/uL — ABNORMAL LOW (ref 3.70–5.32)
RDW: 12.7 % (ref 11.1–15.7)
WBC: 5 10*3/uL (ref 3.9–10.0)

## 2013-12-28 LAB — IRON AND TIBC CHCC
%SAT: 34 % (ref 21–57)
Iron: 132 ug/dL (ref 41–142)
TIBC: 388 ug/dL (ref 236–444)
UIBC: 256 ug/dL (ref 120–384)

## 2013-12-28 LAB — CHCC SATELLITE - SMEAR

## 2013-12-28 LAB — RETICULOCYTES (CHCC)
ABS Retic: 89.9 10*3/uL (ref 19.0–186.0)
RBC.: 2.9 MIL/uL — ABNORMAL LOW (ref 3.87–5.11)
Retic Ct Pct: 3.1 % — ABNORMAL HIGH (ref 0.4–2.3)

## 2013-12-28 LAB — FERRITIN CHCC: Ferritin: 230 ng/ml (ref 9–269)

## 2013-12-28 NOTE — Progress Notes (Signed)
Hematology and Oncology Follow Up Visit  Sonya Moran [id number] 09-02-1975 38 y.o. 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  Sonya Moran is back for followup. We last saw her back in October. She has been in pretty well. She has had no problems with bleeding. She has had some hemorrhoid problems.  Her appetite has been quite good. She has had no nausea or vomiting. She has gained more weight.  She's had no fever. There's been no rashes. She's had no leg swelling.  She has had no problems with bowels or bladder. Again she has some hemorrhoid issues.  There's been no headache. She's had no seizures.  There's been no change in her medications.   Medications: Current outpatient prescriptions: acetaminophen (TYLENOL) 500 MG tablet, Take 500 mg by mouth every 6 (six) hours as needed for pain., Disp: , Rfl: ;  Azelastine HCl (ASTEPRO) 0.15 % SOLN, Place into the nose 2 (two) times daily., Disp: , Rfl: ;  Beclomethasone Dipropionate (QNASL) 80 MCG/ACT AERS, Place 2 sprays into the nose daily. , Disp: , Rfl: ;  celecoxib (CELEBREX) 100 MG capsule, Take 100 mg by mouth daily., Disp: , Rfl:  divalproex (DEPAKOTE) 500 MG EC tablet, Take 500 mg by mouth 2 (two) times daily. Takes 500mg  every morning and takes 1000mg  at bedtime, Disp: , Rfl: ;  fenofibrate (TRICOR) 145 MG tablet, Take 145 mg by mouth daily.  , Disp: , Rfl: ;  Fexofenadine HCl (ALLEGRA PO), Take by mouth at bedtime., Disp: , Rfl: ;  FLUoxetine (PROZAC) 40 MG capsule, Take 40 mg by mouth daily.  , Disp: , Rfl:  lidocaine (XYLOCAINE) 2 % jelly, Apply 1 application topically every 8 (eight) hours as needed. pain, Disp: , Rfl: ;  LITHIUM CARBONATE PO, Take 450 mg by mouth 2 (two) times daily., Disp: , Rfl: ;  montelukast (SINGULAIR) 10 MG tablet, Take 10 mg by mouth at bedtime.  , Disp: , Rfl: ;  omeprazole (PRILOSEC) 20 MG capsule, Take 20 mg by mouth  daily. 2 caps daily as needed, Disp: , Rfl:  Psyllium (METAMUCIL PO), Take 5 g by mouth at bedtime., Disp: , Rfl: ;  QUEtiapine (SEROQUEL) 300 MG tablet, Take 300 mg by mouth at bedtime., Disp: , Rfl: ;  sodium chloride (OCEAN) 0.65 % nasal spray, Place 1 spray into the nose 3 (three) times daily.  , Disp: , Rfl:   Allergies:  Allergies  Allergen Reactions  . Grifulvin V [Griseofulvin] Anaphylaxis and Other (See Comments)    Arm swelling    Past Medical History, Surgical history, Social history, and Family History were reviewed and updated.  Review of Systems: As above  Physical Exam:  height is 5' (1.524 m) and weight is 173 lb (78.472 kg). Her oral temperature is 99.3 F (37.4 C). Her blood pressure is 107/65 and her pulse is 81. Her respiration is 16.   Well-developed and well-nourished white female. Head and neck exam shows no ocular or oral lesions. She has no palpable cervical or supraclavicular lymph nodes. Lungs are clear. Cardiac exam rate and rhythm with no murmurs, rubs or bruits. Abdomen is soft. She has good bowel sounds. There is no fluid wave. There is no palpable liver or spleen tip. Back exam shows no tenderness over the spine ribs or hips. Extremities shows no clubbing, cyanosis or edema. Neurological exam shows no focal neurological deficits.  skin exam shows no rashes, ecchymoses or petechia   Lab Results  Component Value Date   WBC 5.0 12/28/2013   HGB 9.4* 12/28/2013   HCT 29.5* 12/28/2013   MCV 105* 12/28/2013   PLT 109* 12/28/2013     Chemistry      Component Value Date/Time   NA 137 10/29/2013 0936   NA 137 10/10/2010 1126   K 3.5 10/29/2013 0936   K 4.0 10/10/2010 1126   CL 108 10/29/2013 0936   CL 103 10/10/2010 1126   CO2 24 10/29/2013 0936   CO2 25 10/10/2010 1126   BUN 8 10/29/2013 0936   BUN 5* 10/10/2010 1126   CREATININE 0.9 10/29/2013 0936   CREATININE 0.76 10/10/2010 1126      Component Value Date/Time   CALCIUM 9.4 10/29/2013 0936    CALCIUM 9.5 10/10/2010 1126   ALKPHOS 37 10/29/2013 0936   ALKPHOS 47 09/26/2010 1735   AST 55* 10/29/2013 0936   AST 48* 09/26/2010 1735   ALT 34 10/29/2013 0936   ALT 39* 09/26/2010 1735   BILITOT 0.80 10/29/2013 0936   BILITOT 0.3 09/26/2010 1735      Her blood smear shows some microcytic red cells. She has some target cells. There are no nucleated red cells. I see no teardrop cells. She has no rouleau formation. White cells are mature. I see no hypersegmented polys. There are no immature myeloid cells. There are no atypical lymphocytes. Platelets are adequate in number and size.  Impression and Plan: Ms. Sonya Moran  Is a 38 yo white female. She is mentally challenged. She is incredibly nice.  Again, I think that the problem is that she has a very low erythropoietin level. However, she is not symptomatic. I would just hold off on giving her any ESA . I just don't the this would help with her quality of life.  Her blood smear does not look suspicious for a hematologic malignancy. I don't think we have to do a bone marrow test.  For now, I still am willing to watch her. Again I do not see anything that looks suspicious.  I  did send off a genetic test for thalassemia. She was negative for alpha thalassemia.  I will plan to see her back in about 3-4 months.       Sonya Napoleon, MD 12/15/201512:32 PM

## 2013-12-31 DIAGNOSIS — E559 Vitamin D deficiency, unspecified: Secondary | ICD-10-CM | POA: Diagnosis not present

## 2013-12-31 DIAGNOSIS — D539 Nutritional anemia, unspecified: Secondary | ICD-10-CM | POA: Diagnosis not present

## 2013-12-31 DIAGNOSIS — F251 Schizoaffective disorder, depressive type: Secondary | ICD-10-CM | POA: Diagnosis not present

## 2013-12-31 DIAGNOSIS — Z79899 Other long term (current) drug therapy: Secondary | ICD-10-CM | POA: Diagnosis not present

## 2013-12-31 DIAGNOSIS — F2089 Other schizophrenia: Secondary | ICD-10-CM | POA: Diagnosis not present

## 2013-12-31 DIAGNOSIS — F79 Unspecified intellectual disabilities: Secondary | ICD-10-CM | POA: Diagnosis not present

## 2013-12-31 DIAGNOSIS — Z5181 Encounter for therapeutic drug level monitoring: Secondary | ICD-10-CM | POA: Diagnosis not present

## 2014-01-25 DIAGNOSIS — Z3042 Encounter for surveillance of injectable contraceptive: Secondary | ICD-10-CM | POA: Diagnosis not present

## 2014-03-31 DIAGNOSIS — J310 Chronic rhinitis: Secondary | ICD-10-CM | POA: Diagnosis not present

## 2014-03-31 DIAGNOSIS — R05 Cough: Secondary | ICD-10-CM | POA: Diagnosis not present

## 2014-03-31 DIAGNOSIS — J309 Allergic rhinitis, unspecified: Secondary | ICD-10-CM | POA: Diagnosis not present

## 2014-04-19 DIAGNOSIS — Z3042 Encounter for surveillance of injectable contraceptive: Secondary | ICD-10-CM | POA: Diagnosis not present

## 2014-04-28 DIAGNOSIS — Z0000 Encounter for general adult medical examination without abnormal findings: Secondary | ICD-10-CM | POA: Diagnosis not present

## 2014-04-29 ENCOUNTER — Encounter: Payer: Self-pay | Admitting: Family

## 2014-04-29 ENCOUNTER — Other Ambulatory Visit (HOSPITAL_BASED_OUTPATIENT_CLINIC_OR_DEPARTMENT_OTHER): Payer: Medicare Other

## 2014-04-29 ENCOUNTER — Ambulatory Visit (HOSPITAL_BASED_OUTPATIENT_CLINIC_OR_DEPARTMENT_OTHER): Payer: Medicare Other | Admitting: Family

## 2014-04-29 VITALS — BP 120/69 | HR 87 | Temp 98.4°F | Resp 18 | Ht 60.0 in | Wt 175.0 lb

## 2014-04-29 DIAGNOSIS — D631 Anemia in chronic kidney disease: Secondary | ICD-10-CM | POA: Diagnosis not present

## 2014-04-29 DIAGNOSIS — N189 Chronic kidney disease, unspecified: Secondary | ICD-10-CM

## 2014-04-29 DIAGNOSIS — F59 Unspecified behavioral syndromes associated with physiological disturbances and physical factors: Secondary | ICD-10-CM

## 2014-04-29 DIAGNOSIS — D500 Iron deficiency anemia secondary to blood loss (chronic): Secondary | ICD-10-CM

## 2014-04-29 LAB — CBC WITH DIFFERENTIAL (CANCER CENTER ONLY)
BASO#: 0 10*3/uL (ref 0.0–0.2)
BASO%: 0.5 % (ref 0.0–2.0)
EOS%: 1.9 % (ref 0.0–7.0)
Eosinophils Absolute: 0.1 10*3/uL (ref 0.0–0.5)
HCT: 30.8 % — ABNORMAL LOW (ref 34.8–46.6)
HGB: 9.8 g/dL — ABNORMAL LOW (ref 11.6–15.9)
LYMPH#: 1.4 10*3/uL (ref 0.9–3.3)
LYMPH%: 36.2 % (ref 14.0–48.0)
MCH: 33 pg (ref 26.0–34.0)
MCHC: 31.8 g/dL — ABNORMAL LOW (ref 32.0–36.0)
MCV: 104 fL — ABNORMAL HIGH (ref 81–101)
MONO#: 0.4 10*3/uL (ref 0.1–0.9)
MONO%: 11.7 % (ref 0.0–13.0)
NEUT#: 1.9 10*3/uL (ref 1.5–6.5)
NEUT%: 49.7 % (ref 39.6–80.0)
Platelets: 100 10*3/uL — ABNORMAL LOW (ref 145–400)
RBC: 2.97 10*6/uL — ABNORMAL LOW (ref 3.70–5.32)
RDW: 12 % (ref 11.1–15.7)
WBC: 3.8 10*3/uL — ABNORMAL LOW (ref 3.9–10.0)

## 2014-04-29 LAB — CHCC SATELLITE - SMEAR

## 2014-04-29 NOTE — Progress Notes (Signed)
Hematology and Oncology Follow Up Visit  Sonya Moran [id number] Apr 03, 1975 39 y.o. 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 She is feeling good and has no complaints at this time. No problems with infection. She denies cough, rash, headaches, dizziness, SOB, chest pain, palpitations, abdominal pain, constipation, diarrhea, problems urinating, blood in her urine or stool. No seizures.  No swelling, tenderness, numbness or tingling in her extremities. No new aches or pains.  Her appetite is good and she is well hydrated. Her weight is stable.  In December her iron saturation was 34% and ferritin was 230.   Medications:    Medication List       This list is accurate as of: 04/29/14  9:14 AM.  Always use your most recent med list.               acetaminophen 500 MG tablet  Commonly known as:  TYLENOL  Take 500 mg by mouth every 6 (six) hours as needed for pain.     ALLEGRA PO  Take by mouth at bedtime.     ASTEPRO 0.15 % Soln  Generic drug:  Azelastine HCl  Place into the nose 2 (two) times daily.     celecoxib 100 MG capsule  Commonly known as:  CELEBREX  Take 100 mg by mouth daily.     divalproex 500 MG DR tablet  Commonly known as:  DEPAKOTE  Take 500 mg by mouth 2 (two) times daily. Takes 554m every morning and takes 10023mat bedtime     fenofibrate 145 MG tablet  Commonly known as:  TRICOR  Take 145 mg by mouth daily.     FLUoxetine 40 MG capsule  Commonly known as:  PROZAC  Take 40 mg by mouth daily.     lidocaine 2 % jelly  Commonly known as:  XYLOCAINE  Apply 1 application topically every 8 (eight) hours as needed. pain     LITHIUM CARBONATE PO  Take 450 mg by mouth 2 (two) times daily.     METAMUCIL PO  Take 5 g by mouth at bedtime.     montelukast 10 MG tablet  Commonly known as:  SINGULAIR  Take 10 mg by  mouth at bedtime.     omeprazole 20 MG capsule  Commonly known as:  PRILOSEC  Take 20 mg by mouth daily. 2 caps daily as needed     QNASL 80 MCG/ACT Aers  Generic drug:  Beclomethasone Dipropionate  Place 2 sprays into the nose daily.     QUEtiapine 300 MG tablet  Commonly known as:  SEROQUEL  Take 300 mg by mouth at bedtime.     sodium chloride 0.65 % nasal spray  Commonly known as:  OCEAN  Place 1 spray into the nose 3 (three) times daily.        Allergies:  Allergies  Allergen Reactions  . Grifulvin V [Griseofulvin] Anaphylaxis and Other (See Comments)    Arm swelling    Past Medical History, Surgical history, Social history, and Family History were reviewed and updated.  Review of Systems: All other 10 point review of systems is negative.   Physical Exam:  height is 5' (1.524 m) and weight is 175 lb (79.379 kg). Her oral temperature is 98.4 F (36.9 C). Her blood pressure is 120/69 and her pulse is 87. Her respiration  is 18.   Wt Readings from Last 3 Encounters:  04/29/14 175 lb (79.379 kg)  12/28/13 173 lb (78.472 kg)  10/29/13 174 lb (78.926 kg)    Ocular: Sclerae unicteric, pupils equal, round and reactive to light Ear-nose-throat: Oropharynx clear, dentition fair Lymphatic: No cervical or supraclavicular adenopathy Lungs no rales or rhonchi, good excursion bilaterally Heart regular rate and rhythm, no murmur appreciated Abd soft, nontender, positive bowel sounds MSK no focal spinal tenderness, no joint edema Neuro: non-focal, well-oriented, appropriate affect Breasts: Deferred  Lab Results  Component Value Date   WBC 3.8* 04/29/2014   HGB 9.8* 04/29/2014   HCT 30.8* 04/29/2014   MCV 104* 04/29/2014   PLT 100* 04/29/2014   Lab Results  Component Value Date   FERRITIN 230 12/28/2013   IRON 132 12/28/2013   TIBC 388 12/28/2013   UIBC 256 12/28/2013   IRONPCTSAT 34 12/28/2013   Lab Results  Component Value Date   RETICCTPCT 3.1* 12/28/2013    RBC 2.97* 04/29/2014   RETICCTABS 89.9 12/28/2013   No results found for: KPAFRELGTCHN, LAMBDASER, KAPLAMBRATIO No results found for: IGGSERUM, IGA, IGMSERUM No results found for: Odetta Pink, SPEI   Chemistry      Component Value Date/Time   NA 138 12/28/2013 1130   NA 137 10/29/2013 0936   K 4.2 12/28/2013 1130   K 3.5 10/29/2013 0936   CL 108 12/28/2013 1130   CL 108 10/29/2013 0936   CO2 25 12/28/2013 1130   CO2 24 10/29/2013 0936   BUN 12 12/28/2013 1130   BUN 8 10/29/2013 0936   CREATININE 0.77 12/28/2013 1130   CREATININE 0.9 10/29/2013 0936      Component Value Date/Time   CALCIUM 9.3 12/28/2013 1130   CALCIUM 9.4 10/29/2013 0936   ALKPHOS 44 12/28/2013 1130   ALKPHOS 37 10/29/2013 0936   AST 56* 12/28/2013 1130   AST 55* 10/29/2013 0936   ALT 31 12/28/2013 1130   ALT 34 10/29/2013 0936   BILITOT 0.8 12/28/2013 1130   BILITOT 0.80 10/29/2013 0936     Impression and Plan: The patient is a 39 year old white female with multifactorial anemia. She is special-needs and has some psychological issues. She is on numerous medications. She is asymptomatic at this time and seems to be doing quite well.  Her WBC count is 3.8 and platelets 100. She may require a bone marrow biopsy in the future.  We will see her again in 4 months for labs and a follow-up appointment.  I discussed the plan with her and her caregiver. Questions were answered. Her caretaker is very nice and seems to have a good understanding.  They know to call with any questions or concerns and to go to the ED in the event of an emergency. We can certainly see her sooner if need be.   Eliezer Bottom, NP 4/15/20169:14 AM

## 2014-05-05 DIAGNOSIS — F2089 Other schizophrenia: Secondary | ICD-10-CM | POA: Diagnosis not present

## 2014-05-05 DIAGNOSIS — D649 Anemia, unspecified: Secondary | ICD-10-CM | POA: Diagnosis not present

## 2014-05-05 DIAGNOSIS — F79 Unspecified intellectual disabilities: Secondary | ICD-10-CM | POA: Diagnosis not present

## 2014-05-11 ENCOUNTER — Telehealth: Payer: Self-pay | Admitting: Hematology & Oncology

## 2014-05-11 NOTE — Telephone Encounter (Signed)
Faxed medical records to:  [facility] [address], [place], [place] [address]  Phone: [phone] Fax: [phone]      COPY SCANNED

## 2014-06-09 ENCOUNTER — Other Ambulatory Visit: Payer: Self-pay

## 2014-06-09 ENCOUNTER — Encounter (HOSPITAL_BASED_OUTPATIENT_CLINIC_OR_DEPARTMENT_OTHER): Payer: Self-pay | Admitting: *Deleted

## 2014-06-09 ENCOUNTER — Emergency Department (HOSPITAL_BASED_OUTPATIENT_CLINIC_OR_DEPARTMENT_OTHER): Payer: Medicare Other

## 2014-06-09 ENCOUNTER — Emergency Department (HOSPITAL_BASED_OUTPATIENT_CLINIC_OR_DEPARTMENT_OTHER)
Admission: EM | Admit: 2014-06-09 | Discharge: 2014-06-09 | Disposition: A | Discharge status: Home / Self Care | Payer: Medicare Other | Attending: Emergency Medicine | Admitting: Emergency Medicine

## 2014-06-09 DIAGNOSIS — M25512 Pain in left shoulder: Secondary | ICD-10-CM | POA: Diagnosis not present

## 2014-06-09 DIAGNOSIS — Z79899 Other long term (current) drug therapy: Secondary | ICD-10-CM | POA: Insufficient documentation

## 2014-06-09 DIAGNOSIS — Z7951 Long term (current) use of inhaled steroids: Secondary | ICD-10-CM | POA: Insufficient documentation

## 2014-06-09 DIAGNOSIS — F329 Major depressive disorder, single episode, unspecified: Secondary | ICD-10-CM | POA: Insufficient documentation

## 2014-06-09 DIAGNOSIS — S4992XA Unspecified injury of left shoulder and upper arm, initial encounter: Secondary | ICD-10-CM | POA: Diagnosis not present

## 2014-06-09 DIAGNOSIS — F79 Unspecified intellectual disabilities: Secondary | ICD-10-CM | POA: Diagnosis not present

## 2014-06-09 DIAGNOSIS — F4310 Post-traumatic stress disorder, unspecified: Secondary | ICD-10-CM | POA: Diagnosis not present

## 2014-06-09 NOTE — ED Provider Notes (Signed)
CSN: [id number]     Arrival date & time 06/09/14  0840 History  This chart was scribed for Ezequiel Essex, MD by Ludger Nutting, ED Scribe. This patient was seen in room MH01/MH01 and the patient's care was started 8:59 AM.    Chief Complaint  Patient presents with  . Shoulder Pain   The history is provided by the patient. No language interpreter was used.    LEVEL 5 CAVEAT - MR HPI Comments: Sonya Moran is a 39 y.o. female with past medical history of mental retardation who presents to the Emergency Department complaining of constant, unchanged left shoulder pain that began yesterday evening. Care-giver reports patient is left-handed and colors/puzzles for multiple hours at a time. Patient states this pain began while she was coloring and then states she was slept on last night. She denies similar pain the past. Care-giver gave tylenol and applied ice with mild relief last night. She denies weakness, numbness, paresthesias, chest pain, neck pain, back pain, SOB.  Past Medical History  Diagnosis Date  . Mental retardation   . Post traumatic stress disorder   . Depressive disorder   . Psychotic disorder    Past Surgical History  Procedure Laterality Date  . Wrist surgery    . Back surgery     History reviewed. No pertinent family history. History  Substance Use Topics  . Smoking status: Never Smoker   . Smokeless tobacco: Never Used     Comment: never used tobacco  . Alcohol Use: No   OB History    No data available     Review of Systems  Unable to perform ROS: Other        Allergies  Grifulvin v  Home Medications   Prior to Admission medications   Medication Sig Start Date End Date Taking? Authorizing Provider  acetaminophen (TYLENOL) 500 MG tablet Take 500 mg by mouth every 6 (six) hours as needed for pain.    Historical Provider, MD  Azelastine HCl (ASTEPRO) 0.15 % SOLN Place into the nose 2 (two) times daily.    Historical Provider, MD  Beclomethasone Dipropionate  (QNASL) 80 MCG/ACT AERS Place 2 sprays into the nose daily.     Historical Provider, MD  celecoxib (CELEBREX) 100 MG capsule Take 100 mg by mouth daily.    Historical Provider, MD  divalproex (DEPAKOTE) 500 MG EC tablet Take 500 mg by mouth 2 (two) times daily. Takes 500mg  every morning and takes 1000mg  at bedtime    Historical Provider, MD  fenofibrate (TRICOR) 145 MG tablet Take 145 mg by mouth daily.      Historical Provider, MD  Fexofenadine HCl (ALLEGRA PO) Take by mouth at bedtime.    Historical Provider, MD  FLUoxetine (PROZAC) 40 MG capsule Take 40 mg by mouth daily.      Historical Provider, MD  lidocaine (XYLOCAINE) 2 % jelly Apply 1 application topically every 8 (eight) hours as needed. pain    Historical Provider, MD  LITHIUM CARBONATE PO Take 450 mg by mouth 2 (two) times daily.    Historical Provider, MD  montelukast (SINGULAIR) 10 MG tablet Take 10 mg by mouth at bedtime.      Historical Provider, MD  omeprazole (PRILOSEC) 20 MG capsule Take 20 mg by mouth daily. 2 caps daily as needed    Historical Provider, MD  Psyllium (METAMUCIL PO) Take 5 g by mouth at bedtime.    Historical Provider, MD  QUEtiapine (SEROQUEL) 300 MG tablet Take 300 mg  by mouth at bedtime.    Historical Provider, MD  sodium chloride (OCEAN) 0.65 % nasal spray Place 1 spray into the nose 3 (three) times daily.      Historical Provider, MD   BP 122/68 mmHg  Pulse 66  Temp(Src) 98 F (36.7 C) (Oral)  Resp 18  SpO2 99% Physical Exam  Constitutional: She is oriented to person, place, and time. She appears well-developed and well-nourished. No distress.  HENT:  Head: Normocephalic and atraumatic.  Mouth/Throat: Oropharynx is clear and moist. No oropharyngeal exudate.  Eyes: Conjunctivae and EOM are normal. Pupils are equal, round, and reactive to light.  Neck: Normal range of motion. Neck supple.  No meningismus.  Cardiovascular: Normal rate, regular rhythm, normal heart sounds and intact distal pulses.    No murmur heard. Pulmonary/Chest: Effort normal and breath sounds normal. No respiratory distress.  Abdominal: Soft. There is no tenderness. There is no rebound and no guarding.  Musculoskeletal: Normal range of motion. She exhibits tenderness. She exhibits no edema.  Tenderness to left shoulder anterior and laterally. No deformities or ecchymosis. Intact radial pulse. Intact cardinal hand movements. Axiliary nerve sensation intact.  Able to raise arm above head without difficulty.  Neurological: She is alert and oriented to person, place, and time. No cranial nerve deficit. She exhibits normal muscle tone. Coordination normal.  No ataxia on finger to nose bilaterally. No pronator drift. 5/5 strength throughout. CN 2-12 intact. Negative Romberg. Equal grip strength. Sensation intact. Gait is normal.   Skin: Skin is warm.  Psychiatric: She has a normal mood and affect. Her behavior is normal.  Nursing note and vitals reviewed.   ED Course  Procedures (including critical care time)  DIAGNOSTIC STUDIES: Oxygen Saturation is 98% on room air, normal by my interpretation.    COORDINATION OF CARE: 9:05 AM Discussed treatment plan with pt at bedside and pt agreed to plan.   Labs Review Labs Reviewed - No data to display  Imaging Review Dg Shoulder Left  06/09/2014   EXAM: LEFT SHOULDER - 2+ VIEW  COMPARISON:  None.  FINDINGS: There is no evidence of fracture or dislocation. There is no evidence of arthropathy or other focal bone abnormality. Soft tissues are unremarkable.  IMPRESSION: Negative.   Electronically Signed   By: Lahoma Crocker M.D.   On: 06/09/2014 09:55     EKG Interpretation   Date/Time:  Thursday Jun 09 2014 09:17:40 EDT Ventricular Rate:  82 PR Interval:  168 QRS Duration: 88 QT Interval:  398 QTC Calculation: 464 R Axis:   53 Text Interpretation:  Normal sinus rhythm Nonspecific T wave abnormality  Prolonged QT Abnormal ECG Nonspecific ST and T wave abnormality  Confirmed  by Heman Que  MD, [place] ([id number]) on 06/09/2014 9:20:57 AM      MDM   Final diagnoses:  Left shoulder pain   Atraumatic left shoulder pain since yesterday. No chest pain, shortness of breath, or neck pain. No weakness, numbness, or tingling. Neurovascularly intact.  Patient states she colors for prolonged periods of time.  X-ray negative. EKG nonischemic. Low suspicion for ACS. Pain is reproducible and worse with shoulder movement.  Suspect repetitive motion injury from excessive coloring. Advised to decrease these activities and use Tylenol or Motrin as needed for pain. Follow-up with PCP. Return precautions discussed.   I personally performed the services described in this documentation, which was scribed in my presence. The recorded information has been reviewed and is accurate.   Ezequiel Essex, MD 06/09/14 [phone]

## 2014-06-09 NOTE — ED Notes (Signed)
Pt amb to room 1 with quick steady gait in nad. Pt reports left shoulder pain x yesterday, no injury or trauma per caregiver. Pt denies any other c/o.

## 2014-06-09 NOTE — ED Notes (Signed)
MD at bedside. 

## 2014-06-09 NOTE — Discharge Instructions (Signed)
Arthralgia Reduce your coloring and use tylenol or motrin as needed for pain. Return to the ED if you develop new or worsening symptoms. Your caregiver has diagnosed you as suffering from an arthralgia. Arthralgia means there is pain in a joint. This can come from many reasons including:  Bruising the joint which causes soreness (inflammation) in the joint.  Wear and tear on the joints which occur as we grow older (osteoarthritis).  Overusing the joint.  Various forms of arthritis.  Infections of the joint. Regardless of the cause of pain in your joint, most of these different pains respond to anti-inflammatory drugs and rest. The exception to this is when a joint is infected, and these cases are treated with antibiotics, if it is a bacterial infection. HOME CARE INSTRUCTIONS   Rest the injured area for as long as directed by your caregiver. Then slowly start using the joint as directed by your caregiver and as the pain allows. Crutches as directed may be useful if the ankles, knees or hips are involved. If the knee was splinted or casted, continue use and care as directed. If an stretchy or elastic wrapping bandage has been applied today, it should be removed and re-applied every 3 to 4 hours. It should not be applied tightly, but firmly enough to keep swelling down. Watch toes and feet for swelling, bluish discoloration, coldness, numbness or excessive pain. If any of these problems (symptoms) occur, remove the ace bandage and re-apply more loosely. If these symptoms persist, contact your caregiver or return to this location.  For the first 24 hours, keep the injured extremity elevated on pillows while lying down.  Apply ice for 15-20 minutes to the sore joint every couple hours while awake for the first half day. Then 03-04 times per day for the first 48 hours. Put the ice in a plastic bag and place a towel between the bag of ice and your skin.  Wear any splinting, casting, elastic bandage  applications, or slings as instructed.  Only take over-the-counter or prescription medicines for pain, discomfort, or fever as directed by your caregiver. Do not use aspirin immediately after the injury unless instructed by your physician. Aspirin can cause increased bleeding and bruising of the tissues.  If you were given crutches, continue to use them as instructed and do not resume weight bearing on the sore joint until instructed. Persistent pain and inability to use the sore joint as directed for more than 2 to 3 days are warning signs indicating that you should see a caregiver for a follow-up visit as soon as possible. Initially, a hairline fracture (break in bone) may not be evident on X-rays. Persistent pain and swelling indicate that further evaluation, non-weight bearing or use of the joint (use of crutches or slings as instructed), or further X-rays are indicated. X-rays may sometimes not show a small fracture until a week or 10 days later. Make a follow-up appointment with your own caregiver or one to whom we have referred you. A radiologist (specialist in reading X-rays) may read your X-rays. Make sure you know how you are to obtain your X-ray results. Do not assume everything is normal if you do not hear from [place]. SEEK MEDICAL CARE IF: Bruising, swelling, or pain increases. SEEK IMMEDIATE MEDICAL CARE IF:   Your fingers or toes are numb or blue.  The pain is not responding to medications and continues to stay the same or get worse.  The pain in your joint becomes severe.  You develop a fever over 102 F (38.9 C).  It becomes impossible to move or use the joint. MAKE SURE YOU:   Understand these instructions.  Will watch your condition.  Will get help right away if you are not doing well or get worse. Document Released: 12/31/2004 Document Revised: 03/25/2011 Document Reviewed: 08/19/2007 [employer] Patient Information 2015 [place], [place]. This information is not intended to  replace advice given to you by your health care provider. Make sure you discuss any questions you have with your health care provider.

## 2014-06-16 DIAGNOSIS — K649 Unspecified hemorrhoids: Secondary | ICD-10-CM | POA: Diagnosis not present

## 2014-06-16 DIAGNOSIS — K5900 Constipation, unspecified: Secondary | ICD-10-CM | POA: Diagnosis not present

## 2014-06-16 DIAGNOSIS — Z124 Encounter for screening for malignant neoplasm of cervix: Secondary | ICD-10-CM | POA: Diagnosis not present

## 2014-06-16 DIAGNOSIS — Z01419 Encounter for gynecological examination (general) (routine) without abnormal findings: Secondary | ICD-10-CM | POA: Diagnosis not present

## 2014-06-23 DIAGNOSIS — F70 Mild intellectual disabilities: Secondary | ICD-10-CM | POA: Diagnosis not present

## 2014-06-23 DIAGNOSIS — F251 Schizoaffective disorder, depressive type: Secondary | ICD-10-CM | POA: Diagnosis not present

## 2014-06-28 DIAGNOSIS — E039 Hypothyroidism, unspecified: Secondary | ICD-10-CM | POA: Diagnosis not present

## 2014-06-28 DIAGNOSIS — Z79899 Other long term (current) drug therapy: Secondary | ICD-10-CM | POA: Diagnosis not present

## 2014-06-28 DIAGNOSIS — F251 Schizoaffective disorder, depressive type: Secondary | ICD-10-CM | POA: Diagnosis not present

## 2014-07-05 DIAGNOSIS — Z3042 Encounter for surveillance of injectable contraceptive: Secondary | ICD-10-CM | POA: Diagnosis not present

## 2014-07-05 DIAGNOSIS — Z1231 Encounter for screening mammogram for malignant neoplasm of breast: Secondary | ICD-10-CM | POA: Diagnosis not present

## 2014-08-12 DIAGNOSIS — J069 Acute upper respiratory infection, unspecified: Secondary | ICD-10-CM | POA: Diagnosis not present

## 2014-08-12 DIAGNOSIS — H6690 Otitis media, unspecified, unspecified ear: Secondary | ICD-10-CM | POA: Diagnosis not present

## 2014-08-31 ENCOUNTER — Encounter: Payer: Self-pay | Admitting: Hematology & Oncology

## 2014-08-31 ENCOUNTER — Other Ambulatory Visit: Payer: Medicare Other

## 2014-08-31 ENCOUNTER — Ambulatory Visit (HOSPITAL_BASED_OUTPATIENT_CLINIC_OR_DEPARTMENT_OTHER): Payer: Medicare Other | Admitting: Hematology & Oncology

## 2014-08-31 VITALS — BP 118/72 | HR 80 | Temp 98.1°F | Wt 177.0 lb

## 2014-08-31 DIAGNOSIS — D508 Other iron deficiency anemias: Secondary | ICD-10-CM

## 2014-08-31 DIAGNOSIS — N189 Chronic kidney disease, unspecified: Secondary | ICD-10-CM | POA: Diagnosis present

## 2014-08-31 DIAGNOSIS — D631 Anemia in chronic kidney disease: Secondary | ICD-10-CM | POA: Diagnosis not present

## 2014-08-31 DIAGNOSIS — F59 Unspecified behavioral syndromes associated with physiological disturbances and physical factors: Secondary | ICD-10-CM | POA: Diagnosis not present

## 2014-08-31 LAB — CBC WITH DIFFERENTIAL (CANCER CENTER ONLY)
BASO#: 0 10*3/uL (ref 0.0–0.2)
BASO%: 0.5 % (ref 0.0–2.0)
EOS%: 2.6 % (ref 0.0–7.0)
Eosinophils Absolute: 0.1 10*3/uL (ref 0.0–0.5)
HCT: 28.3 % — ABNORMAL LOW (ref 34.8–46.6)
HGB: 9 g/dL — ABNORMAL LOW (ref 11.6–15.9)
LYMPH#: 1.7 10*3/uL (ref 0.9–3.3)
LYMPH%: 38.6 % (ref 14.0–48.0)
MCH: 33.5 pg (ref 26.0–34.0)
MCHC: 31.8 g/dL — ABNORMAL LOW (ref 32.0–36.0)
MCV: 105 fL — ABNORMAL HIGH (ref 81–101)
MONO#: 0.4 10*3/uL (ref 0.1–0.9)
MONO%: 9.8 % (ref 0.0–13.0)
NEUT#: 2.1 10*3/uL (ref 1.5–6.5)
NEUT%: 48.5 % (ref 39.6–80.0)
Platelets: 112 10*3/uL — ABNORMAL LOW (ref 145–400)
RBC: 2.69 10*6/uL — ABNORMAL LOW (ref 3.70–5.32)
RDW: 12.9 % (ref 11.1–15.7)
WBC: 4.3 10*3/uL (ref 3.9–10.0)

## 2014-08-31 NOTE — Progress Notes (Signed)
Hematology and Oncology Follow Up Visit  Sonya Moran [id number] 07/24/1975 39 y.o. 08/31/2014   Principle Diagnosis:   Anemia,erythropoietin deficiency  Current Therapy:    Observation     Interim History:  Ms.  Moran is back for followup. She is doing pretty well. She lives in a special home. She goes to a special day school.  She has a birthday next month. She is very excited about this.  She's had no problems with infections. She's had no bleeding. She's had no cough.  Her appetite has been quite good. She certainly does eat quite a bit area.  She's had no rashes. She's had no obvious change in bowel or bladder habits.  Her caretaker says that she has had a mammogram this year. Medications:  Current outpatient prescriptions:  .  acetaminophen (TYLENOL) 500 MG tablet, Take 500 mg by mouth every 6 (six) hours as needed for pain., Disp: , Rfl:  .  Azelastine HCl (ASTEPRO) 0.15 % SOLN, Place into the nose 2 (two) times daily., Disp: , Rfl:  .  Beclomethasone Dipropionate (QNASL) 80 MCG/ACT AERS, Place 2 sprays into the nose daily. , Disp: , Rfl:  .  celecoxib (CELEBREX) 100 MG capsule, Take 100 mg by mouth daily., Disp: , Rfl:  .  divalproex (DEPAKOTE) 500 MG EC tablet, Take 500 mg by mouth 2 (two) times daily. Takes 500mg  every morning and takes 1000mg  at bedtime, Disp: , Rfl:  .  fenofibrate (TRICOR) 145 MG tablet, Take 145 mg by mouth daily.  , Disp: , Rfl:  .  Fexofenadine HCl (ALLEGRA PO), Take by mouth at bedtime., Disp: , Rfl:  .  FLUoxetine (PROZAC) 40 MG capsule, Take 40 mg by mouth daily.  , Disp: , Rfl:  .  lidocaine (XYLOCAINE) 2 % jelly, Apply 1 application topically every 8 (eight) hours as needed. pain, Disp: , Rfl:  .  LITHIUM CARBONATE PO, Take 450 mg by mouth 2 (two) times daily., Disp: , Rfl:  .  montelukast (SINGULAIR) 10 MG tablet, Take 10 mg by mouth at bedtime.  , Disp: , Rfl:  .  omeprazole (PRILOSEC) 20 MG capsule, Take 20 mg by mouth daily. 2 caps daily  as needed, Disp: , Rfl:  .  Psyllium (METAMUCIL PO), Take 5 g by mouth at bedtime., Disp: , Rfl:  .  QUEtiapine (SEROQUEL) 300 MG tablet, Take 300 mg by mouth at bedtime., Disp: , Rfl:  .  sodium chloride (OCEAN) 0.65 % nasal spray, Place 1 spray into the nose 3 (three) times daily.  , Disp: , Rfl:   Allergies:  Allergies  Allergen Reactions  . Grifulvin V [Griseofulvin] Anaphylaxis and Other (See Comments)    Arm swelling    Past Medical History, Surgical history, Social history, and Family History were reviewed and updated.  Review of Systems: As above  Physical Exam:  vitals were not taken for this visit.  Well-developed and well-nourished white female. Head and neck exam shows no ocular or oral lesions. She has no palpable cervical or supraclavicular lymph nodes. Lungs are clear. Cardiac exam rate and rhythm with no murmurs, rubs or bruits. Abdomen is soft. She has good bowel sounds. There is no fluid wave. There is no palpable liver or spleen tip. Back exam shows no tenderness over the spine ribs or hips. Extremities shows no clubbing, cyanosis or edema. Neurological exam shows no focal neurological deficits. skin exam shows no rashes, ecchymoses or petechia   Lab Results  Component Value  Date   WBC 4.3 08/31/2014   HGB 9.0* 08/31/2014   HCT 28.3* 08/31/2014   MCV 105* 08/31/2014   PLT 112* 08/31/2014     Chemistry      Component Value Date/Time   NA 138 12/28/2013 1130   NA 137 10/29/2013 0936   K 4.2 12/28/2013 1130   K 3.5 10/29/2013 0936   CL 108 12/28/2013 1130   CL 108 10/29/2013 0936   CO2 25 12/28/2013 1130   CO2 24 10/29/2013 0936   BUN 12 12/28/2013 1130   BUN 8 10/29/2013 0936   CREATININE 0.77 12/28/2013 1130   CREATININE 0.9 10/29/2013 0936      Component Value Date/Time   CALCIUM 9.3 12/28/2013 1130   CALCIUM 9.4 10/29/2013 0936   ALKPHOS 44 12/28/2013 1130   ALKPHOS 37 10/29/2013 0936   AST 56* 12/28/2013 1130   AST 55* 10/29/2013 0936   ALT 31  12/28/2013 1130   ALT 34 10/29/2013 0936   BILITOT 0.8 12/28/2013 1130   BILITOT 0.80 10/29/2013 0936      Her blood smear shows some microcytic red cells. She has some target cells. There are no nucleated red cells. I see no teardrop cells. She has no rouleau formation. White cells are mature. I see no hypersegmented polys. There are no immature myeloid cells. There are no atypical lymphocytes. Platelets are adequate in number and size.  Impression and Plan: Sonya Moran  Is a 39 yo white female. She is mentally challenged. She is incredibly nice.  Again, I think that the problem is that she has a very low erythropoietin level. However, she is not symptomatic. I would just hold off on giving her any ESA . I just don't the this would help with her quality of life.  Her blood smear does not look suspicious for a hematologic malignancy. I don't think we have to do a bone marrow test.  For now, I still am willing to watch her. Again I do not see anything that looks suspicious.  I  did send off a genetic test for thalassemia. She was negative for alpha thalassemia.  I will plan to see her back in about 6 months.       Volanda Napoleon, MD 8/17/20169:06 AM

## 2014-08-31 NOTE — Addendum Note (Signed)
Addended by: Rico Ala on: 08/31/2014 09:23 AM   Modules accepted: Medications

## 2014-09-08 DIAGNOSIS — F79 Unspecified intellectual disabilities: Secondary | ICD-10-CM | POA: Diagnosis not present

## 2014-09-08 DIAGNOSIS — N3281 Overactive bladder: Secondary | ICD-10-CM | POA: Diagnosis not present

## 2014-09-22 DIAGNOSIS — Z3042 Encounter for surveillance of injectable contraceptive: Secondary | ICD-10-CM | POA: Diagnosis not present

## 2014-10-24 DIAGNOSIS — Z6835 Body mass index (BMI) 35.0-35.9, adult: Secondary | ICD-10-CM | POA: Diagnosis not present

## 2014-10-24 DIAGNOSIS — N3281 Overactive bladder: Secondary | ICD-10-CM | POA: Diagnosis not present

## 2014-12-12 DIAGNOSIS — Z3042 Encounter for surveillance of injectable contraceptive: Secondary | ICD-10-CM | POA: Diagnosis not present

## 2014-12-13 DIAGNOSIS — Z23 Encounter for immunization: Secondary | ICD-10-CM | POA: Diagnosis not present

## 2014-12-13 DIAGNOSIS — J069 Acute upper respiratory infection, unspecified: Secondary | ICD-10-CM | POA: Diagnosis not present

## 2014-12-22 DIAGNOSIS — F70 Mild intellectual disabilities: Secondary | ICD-10-CM | POA: Diagnosis not present

## 2014-12-22 DIAGNOSIS — F251 Schizoaffective disorder, depressive type: Secondary | ICD-10-CM | POA: Diagnosis not present

## 2014-12-22 DIAGNOSIS — F609 Personality disorder, unspecified: Secondary | ICD-10-CM | POA: Diagnosis not present

## 2015-02-16 ENCOUNTER — Ambulatory Visit (INDEPENDENT_AMBULATORY_CARE_PROVIDER_SITE_OTHER): Payer: Medicare Other | Admitting: Allergy and Immunology

## 2015-02-16 ENCOUNTER — Encounter: Payer: Self-pay | Admitting: Allergy and Immunology

## 2015-02-16 VITALS — BP 110/68 | HR 70 | Resp 18

## 2015-02-16 DIAGNOSIS — J310 Chronic rhinitis: Secondary | ICD-10-CM

## 2015-02-16 MED ORDER — BECLOMETHASONE DIPROPIONATE 80 MCG/ACT NA AERS: 2.0000 | INHALATION_SPRAY | Freq: Every day | NASAL | Status: DC

## 2015-02-16 MED ORDER — AZELASTINE HCL 0.15 % NA SOLN: 2.0000 | Freq: Two times a day (BID) | NASAL | Status: DC

## 2015-02-16 MED ORDER — FEXOFENADINE HCL 180 MG PO TABS: 180.0000 mg | ORAL_TABLET | Freq: Every day | ORAL | Status: DC

## 2015-02-16 NOTE — Patient Instructions (Signed)
Take Home Sheet  1. Avoidance: Mite and Mold   2. Antihistamine: Allegra 180mg  by mouth once daily for runny nose or itching.   3. Nasal Spray: Astepro 2 spray(s) each nostril once-twice daily for stuffy nose or drainage.      Qnasl 1-2 sprays each nostril  Midday for congestion.  4. Continue Singlulair 10mg  daily.   5.  If needed for congestion use saline nasal wash.  6.  If continued ear concerns re-evaluation with Dr. Rogelia Rohrer.  7. Follow up Visit:  6-9 months or sooner if needed.   Websites that have reliable Patient information: 1. American Academy of Asthma, Allergy, & Immunology: www.aaaai.org 2. Food Allergy Network: www.foodallergy.org 3. Mothers of Asthmatics: www.aanma.org 4. [employer]: DiningCalendar.de 5. American College of Allergy, Asthma, & Immunology: https://robertson.info/ or www.acaai.org

## 2015-02-19 NOTE — Progress Notes (Addendum)
FOLLOW UP NOTE  RE: Sonya Moran MRN: [id number] DOB: [birth year] [facility] [place] [address] [place] [address] Date of Office Visit: 02/16/2015  Subjective:  Sonya Moran is a 40 y.o. female who presents today for Nasal Congestion  Assessment:   1. Mixed rhinitis.   2.      History of otitis media, usually right with mild skin xerosis and now otherwise normal TM. 3.      Possible component of intermittent eustachian tube dysfunction. 4.      History of mental retardation and complex psychiatric history. Plan:   Meds ordered this encounter  Medications  . Beclomethasone Dipropionate (QNASL) 80 MCG/ACT AERS    Sig: Place 2 sprays into the nose daily.    Dispense:  1 Inhaler    Refill:  5  . Azelastine HCl (ASTEPRO) 0.15 % SOLN    Sig: Place 2 sprays into the nose 2 (two) times daily.    Dispense:  30 mL    Refill:  5  . fexofenadine (ALLEGRA) 180 MG tablet    Sig: Take 1 tablet (180 mg total) by mouth daily.    Dispense:  30 tablet    Refill:  5   Patient Instructions  1. Avoidance: Mite and Mold 2. Antihistamine: Allegra 180mg  by mouth once daily for runny nose or itching. 3. Nasal Spray: Astepro 2 spray(s) each nostril once-twice daily for stuffy nose or drainage.      Qnasl 1-2 sprays each nostril Midday for congestion. 4. Continue Singlulair 10mg  daily. 5.  If needed for congestion use saline nasal wash. 6.  If continued ear concerns re-evaluation with Dr. Rogelia Rohrer. 7. Follow up Visit:  6-9 months or sooner if needed.  HPI: Free returns to the office in follow-up of mixed rhinitis, last visit July with her caregiver.  Since her last visit when we were unable to obtain, Clarinex, we reviewed re-initiating Allegra but her caregiver states they do not have that currently.  They both report mild nasal congestion, drainage and Sonya Moran on occasion complains of her ear, but they did not review the possibility of seeing ENT from July  visit either.  Today,  Sonya Moran says there is no ear pain or drainage, but slight irritation.  There have been no new medical concerns.  Denies headache, sore throat, fever, cough, or lower respiratory symptoms.  Denies ED or urgent care visits, prednisone or antibiotic courses. Reports appetite, sleep and activity are normal.  They both report the nasal sprays are definitely beneficial.  Sonya Moran has a current medication list which includes the following prescription(s): acetaminophen, azelastine hcl, beclomethasone dipropionate, celecoxib, divalproex, docusate sodium, fenofibrate, fluoxetine, levothyroxine, lidocaine, lithium carbonate, montelukast, omeprazole, psyllium, quetiapine, sodium chloride.   Drug Allergies: Allergies  Allergen Reactions  . Grifulvin V [Griseofulvin] Anaphylaxis and Other (See Comments)    Arm swelling   Objective:   Filed Vitals:   02/16/15 1017  BP: 110/68  Pulse: 70  Resp: 18   Physical Exam  Constitutional: She is well-developed, well-nourished, and in no distress.  HENT:  Head: Atraumatic.  Right Ear: Ear canal normal. No drainage or tenderness. Tympanic membrane is scarred (scant dry skin in ear canal.). Tympanic membrane is not injected and not erythematous. No middle ear effusion.  Left Ear: Tympanic membrane and ear canal normal. No drainage or tenderness. Tympanic membrane is not injected and not erythematous.  No middle ear effusion.  Nose: Mucosal edema present. No rhinorrhea. No  epistaxis.  Mouth/Throat: Oropharynx is clear and moist and mucous membranes are normal. No oropharyngeal exudate, posterior oropharyngeal edema or posterior oropharyngeal erythema.  Neck: Neck supple.  Cardiovascular: Normal rate, S1 normal and S2 normal.   No murmur heard. Pulmonary/Chest: Effort normal. She has no wheezes. She has no rhonchi. She has no rales.  Lymphadenopathy:    She has no cervical adenopathy.      Geoff Dacanay M. Ishmael Holter, MD  cc: Elyn Peers, MD

## 2015-02-20 DIAGNOSIS — S40012A Contusion of left shoulder, initial encounter: Secondary | ICD-10-CM | POA: Diagnosis not present

## 2015-02-20 DIAGNOSIS — T148 Other injury of unspecified body region: Secondary | ICD-10-CM | POA: Diagnosis not present

## 2015-02-20 DIAGNOSIS — S4992XA Unspecified injury of left shoulder and upper arm, initial encounter: Secondary | ICD-10-CM | POA: Diagnosis not present

## 2015-02-20 DIAGNOSIS — M25512 Pain in left shoulder: Secondary | ICD-10-CM | POA: Diagnosis not present

## 2015-02-27 ENCOUNTER — Telehealth: Payer: Self-pay | Admitting: Hematology & Oncology

## 2015-02-27 NOTE — Telephone Encounter (Signed)
Patient's caregiver called and cx 03/01/15 and resch for 03/20/15

## 2015-02-28 DIAGNOSIS — Z3042 Encounter for surveillance of injectable contraceptive: Secondary | ICD-10-CM | POA: Diagnosis not present

## 2015-03-01 ENCOUNTER — Ambulatory Visit: Payer: Self-pay | Admitting: Family

## 2015-03-01 ENCOUNTER — Other Ambulatory Visit: Payer: Self-pay

## 2015-03-05 ENCOUNTER — Other Ambulatory Visit: Payer: Self-pay | Admitting: Allergy and Immunology

## 2015-03-20 ENCOUNTER — Other Ambulatory Visit (HOSPITAL_BASED_OUTPATIENT_CLINIC_OR_DEPARTMENT_OTHER): Payer: Medicare Other

## 2015-03-20 ENCOUNTER — Encounter: Payer: Self-pay | Admitting: Family

## 2015-03-20 ENCOUNTER — Ambulatory Visit (HOSPITAL_BASED_OUTPATIENT_CLINIC_OR_DEPARTMENT_OTHER): Payer: Medicare Other | Admitting: Family

## 2015-03-20 VITALS — BP 105/65 | HR 85 | Temp 98.4°F | Resp 16 | Ht 60.0 in | Wt 180.0 lb

## 2015-03-20 DIAGNOSIS — D631 Anemia in chronic kidney disease: Secondary | ICD-10-CM

## 2015-03-20 DIAGNOSIS — N189 Chronic kidney disease, unspecified: Secondary | ICD-10-CM

## 2015-03-20 DIAGNOSIS — D509 Iron deficiency anemia, unspecified: Secondary | ICD-10-CM

## 2015-03-20 DIAGNOSIS — D508 Other iron deficiency anemias: Secondary | ICD-10-CM

## 2015-03-20 LAB — CBC WITH DIFFERENTIAL (CANCER CENTER ONLY)
BASO#: 0 10*3/uL (ref 0.0–0.2)
BASO%: 0.3 % (ref 0.0–2.0)
EOS%: 2.3 % (ref 0.0–7.0)
Eosinophils Absolute: 0.1 10*3/uL (ref 0.0–0.5)
HCT: 30.2 % — ABNORMAL LOW (ref 34.8–46.6)
HGB: 9.6 g/dL — ABNORMAL LOW (ref 11.6–15.9)
LYMPH#: 1.6 10*3/uL (ref 0.9–3.3)
LYMPH%: 41.8 % (ref 14.0–48.0)
MCH: 33 pg (ref 26.0–34.0)
MCHC: 31.8 g/dL — ABNORMAL LOW (ref 32.0–36.0)
MCV: 104 fL — ABNORMAL HIGH (ref 81–101)
MONO#: 0.4 10*3/uL (ref 0.1–0.9)
MONO%: 9.9 % (ref 0.0–13.0)
NEUT#: 1.8 10*3/uL (ref 1.5–6.5)
NEUT%: 45.7 % (ref 39.6–80.0)
Platelets: 105 10*3/uL — ABNORMAL LOW (ref 145–400)
RBC: 2.91 10*6/uL — ABNORMAL LOW (ref 3.70–5.32)
RDW: 12.5 % (ref 11.1–15.7)
WBC: 3.8 10*3/uL — ABNORMAL LOW (ref 3.9–10.0)

## 2015-03-20 LAB — CHCC SATELLITE - SMEAR

## 2015-03-20 NOTE — Progress Notes (Signed)
Hematology and Oncology Follow Up Visit  Sonya KRISHNAMURTHY MD:[id number] 1975/09/02 40 y.o. 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 She is doing quite well and has no complaints at this time. Her Hgb is stable at 9.6 and an MCV of 105.  No fever, chills, n/v, cough, rash, headaches, dizziness, SOB, chest pain, palpitations, abdominal pain or changes in bowel or bladder habits.  No seizures. No lymphadenopathy found on exam. No episodes of bleeding or bruising.  No swelling, tenderness, numbness or tingling in her extremities. No c/o pain at this time.   She has maintained a good appetite and is staying hydrated. She is exercising and swimming at the gym in an effort to lose some weight.    Medications:    Medication List       This list is accurate as of: 03/20/15  8:58 AM.  Always use your most recent med list.               acetaminophen 500 MG tablet  Commonly known as:  TYLENOL  Take 500 mg by mouth every 6 (six) hours as needed for pain.     ALLEGRA PO  Take by mouth at bedtime. Reported on 02/16/2015     fexofenadine 180 MG tablet  Commonly known as:  ALLEGRA  Take 1 tablet (180 mg total) by mouth daily.     Azelastine HCl 0.15 % Soln  Commonly known as:  ASTEPRO  Place 2 sprays into the nose 2 (two) times daily.     Beclomethasone Dipropionate 80 MCG/ACT Aers  Commonly known as:  QNASL  Place 2 sprays into the nose daily.     celecoxib 100 MG capsule  Commonly known as:  CELEBREX  Take 100 mg by mouth daily.     divalproex 500 MG DR tablet  Commonly known as:  DEPAKOTE  Take 500 mg by mouth 2 (two) times daily. Takes 500mg  every morning and takes 1000mg  at bedtime     docusate sodium 100 MG capsule  Commonly known as:  COLACE  Take 100 mg by mouth daily.     fenofibrate 145 MG tablet  Commonly known as:   TRICOR  Take 145 mg by mouth daily.     FLUoxetine 40 MG capsule  Commonly known as:  PROZAC  Take 40 mg by mouth daily. Reported on 02/16/2015     levothyroxine 25 MCG tablet  Commonly known as:  SYNTHROID, LEVOTHROID  Take 25 mcg by mouth daily before breakfast.     lidocaine 2 % jelly  Commonly known as:  XYLOCAINE  Apply 1 application topically every 8 (eight) hours as needed. pain     LITHIUM CARBONATE PO  Take 450 mg by mouth 2 (two) times daily.     METAMUCIL PO  Take 5 g by mouth at bedtime.     montelukast 10 MG tablet  Commonly known as:  SINGULAIR  TAKE ONE TABLET BY MOUTH EACH EVENING TO PREVENT COUGH OR WHEEZE     omeprazole 20 MG capsule  Commonly known as:  PRILOSEC  Take 20 mg by mouth daily. 2 caps daily as needed     QUEtiapine 300 MG tablet  Commonly known as:  SEROQUEL  Take 300 mg by mouth at bedtime.     sodium chloride 0.65 % nasal spray  Commonly known as:  OCEAN  Place 1 spray into the nose 3 (three) times daily.        Allergies:  Allergies  Allergen Reactions  . Grifulvin V [Griseofulvin] Anaphylaxis and Other (See Comments)    Arm swelling    Past Medical History, Surgical history, Social history, and Family History were reviewed and updated.  Review of Systems: All other 10 point review of systems is negative.   Physical Exam:  vitals were not taken for this visit.  Wt Readings from Last 3 Encounters:  08/31/14 177 lb (80.287 kg)  04/29/14 175 lb (79.379 kg)  12/28/13 173 lb (78.472 kg)    Ocular: Sclerae unicteric, pupils equal, round and reactive to light Ear-nose-throat: Oropharynx clear, dentition fair Lymphatic: No cervical supraclavicular of axillary adenopathy Lungs no rales or rhonchi, good excursion bilaterally Heart regular rate and rhythm, no murmur appreciated Abd soft, nontender, positive bowel sounds, no liver or spleen tip palpated on exam  MSK no focal spinal tenderness, no joint edema Neuro: non-focal,  well-oriented, appropriate affect Breasts: Deferred  Lab Results  Component Value Date   WBC 3.8* 03/20/2015   HGB 9.6* 03/20/2015   HCT 30.2* 03/20/2015   MCV 104* 03/20/2015   PLT 105* 03/20/2015   Lab Results  Component Value Date   FERRITIN 230 12/28/2013   IRON 132 12/28/2013   TIBC 388 12/28/2013   UIBC 256 12/28/2013   IRONPCTSAT 34 12/28/2013   Lab Results  Component Value Date   RETICCTPCT 3.1* 12/28/2013   RBC 2.91* 03/20/2015   RETICCTABS 89.9 12/28/2013   No results found for: KPAFRELGTCHN, LAMBDASER, KAPLAMBRATIO No results found for: IGGSERUM, IGA, IGMSERUM No results found for: Odetta Pink, SPEI   Chemistry      Component Value Date/Time   NA 138 12/28/2013 1130   NA 137 10/29/2013 0936   K 4.2 12/28/2013 1130   K 3.5 10/29/2013 0936   CL 108 12/28/2013 1130   CL 108 10/29/2013 0936   CO2 25 12/28/2013 1130   CO2 24 10/29/2013 0936   BUN 12 12/28/2013 1130   BUN 8 10/29/2013 0936   CREATININE 0.77 12/28/2013 1130   CREATININE 0.9 10/29/2013 0936      Component Value Date/Time   CALCIUM 9.3 12/28/2013 1130   CALCIUM 9.4 10/29/2013 0936   ALKPHOS 44 12/28/2013 1130   ALKPHOS 37 10/29/2013 0936   AST 56* 12/28/2013 1130   AST 55* 10/29/2013 0936   ALT 31 12/28/2013 1130   ALT 34 10/29/2013 0936   BILITOT 0.8 12/28/2013 1130   BILITOT 0.80 10/29/2013 0936     Impression and Plan: The patient is a 40 yo white female with multifactorial anemia. She continues to do well and is asymptomatic at this time.  Her Hgb is stable at 9.6 with an MCV of 105. Platelets are 105. She has had no episodes of bleeding or bruising.  We will continue to follow along with her and plan to see her back in 6 months. Her facility will contact us with any questions or concerns. We can certainly see her sooner if need be.   Eliezer Bottom, NP 3/6/20178:58 AM

## 2015-03-28 ENCOUNTER — Ambulatory Visit (INDEPENDENT_AMBULATORY_CARE_PROVIDER_SITE_OTHER): Payer: Medicare Other | Admitting: Podiatry

## 2015-03-28 ENCOUNTER — Encounter: Payer: Self-pay | Admitting: Podiatry

## 2015-03-28 VITALS — BP 117/71 | HR 84 | Ht 60.0 in | Wt 180.0 lb

## 2015-03-28 DIAGNOSIS — M722 Plantar fascial fibromatosis: Secondary | ICD-10-CM | POA: Diagnosis not present

## 2015-03-28 DIAGNOSIS — M21969 Unspecified acquired deformity of unspecified lower leg: Secondary | ICD-10-CM

## 2015-03-28 DIAGNOSIS — M7730 Calcaneal spur, unspecified foot: Secondary | ICD-10-CM | POA: Insufficient documentation

## 2015-03-28 MED ORDER — LIDOCAINE 5 % EX OINT: 1.0000 "application " | TOPICAL_OINTMENT | CUTANEOUS | Status: DC | PRN

## 2015-03-28 NOTE — Patient Instructions (Signed)
Seen for right heel pain. Reviewed findings with care taker.  Informed the benefit of custom/OTC orthotics. Injection given. Return as needed.

## 2015-03-28 NOTE — Progress Notes (Signed)
Subjective: 40 year old female came in with a care taker complaining of pain in right heel with radiating pain to the medial knee and with a bulged out fat papule, 1 cm in diameter at medial aspect of right heel.   Objective: Severe elevated and hypermobile first ray bilateral. Pain in right heel. Pizogenic papule right medial heel with pain. No abnormal open skin lesions.  Radiographic examination reveal cavus type foot with severely elevated first Metatarsal bone with plantar and posterior calcaneal spur in lateral view bilateral. Normal findings in AP view.   Assessment: Plantar fasciitis right. Pizogenic papule right medial heel. Pain in right lower limb.   Plan:  Reviewed clinical findings and available treatment options. Injected Right plantar heel with mixture of 4 mg Dexamethasone, 4 mg Triamcinolone, and 1 cc of 0.5% Marcaine plain. Patient tolerated well without difficulty.  Reviewed the benefit of custom orthotics.

## 2015-04-12 ENCOUNTER — Ambulatory Visit (INDEPENDENT_AMBULATORY_CARE_PROVIDER_SITE_OTHER): Payer: Medicare Other | Admitting: Podiatry

## 2015-04-12 ENCOUNTER — Encounter: Payer: Self-pay | Admitting: Podiatry

## 2015-04-12 DIAGNOSIS — M722 Plantar fascial fibromatosis: Secondary | ICD-10-CM

## 2015-04-12 DIAGNOSIS — M21969 Unspecified acquired deformity of unspecified lower leg: Secondary | ICD-10-CM

## 2015-04-12 DIAGNOSIS — M659 Synovitis and tenosynovitis, unspecified: Secondary | ICD-10-CM | POA: Diagnosis not present

## 2015-04-12 NOTE — Patient Instructions (Signed)
Follow up on right heel pain. Improvement made with Cortisone injection.  Now hurting on top of right foot. May benefit from Custom/OTC orthotics. Return in one month after trying out Orthotics.

## 2015-04-12 NOTE — Progress Notes (Signed)
Subjective: 40 year old female came in with a care taker for the follow up on right heel pain. Stated that the heel is better now but hurts on top of right foot. Patient points 2nd MCJ at Lisfranc joint of right foot being the source of foot pain.  2 weeks ago her pain was on right plantar heel and was injected with Cortisone.   Objective: Severe elevated and hypermobile first ray bilateral. Pain in right heel. Pain in 2nd MCJ right dorsum. Pizogenic papule right medial heel with pain. No abnormal open skin lesions. Neurovascular status are within normal.   Previous Radiographic Report: Cavus type foot with severely elevated first Metatarsal bone with plantar and posterior calcaneal spur in lateral view bilateral. Normal findings in AP view.   Assessment: Plantar fasciitis right. Pizogenic papule right medial heel. Pain in right lower limb.  Metatarsus primus elevatus bilateral.  Plan:  Reviewed clinical findings and available treatment options. Reviewed the benefit of custom/OTC orthotics. Care taker will contact guardian to purchase non covered item, orthotics.  If not improved with Orthotics and injections, may require surgical intervention.

## 2015-04-14 ENCOUNTER — Other Ambulatory Visit: Payer: Self-pay | Admitting: Pediatrics

## 2015-05-02 ENCOUNTER — Other Ambulatory Visit: Payer: Self-pay | Admitting: Family Medicine

## 2015-05-02 ENCOUNTER — Other Ambulatory Visit (HOSPITAL_COMMUNITY)
Admission: RE | Admit: 2015-05-02 | Discharge: 2015-05-02 | Disposition: A | Discharge status: Home / Self Care | Payer: Medicare Other | Source: Ambulatory Visit | Attending: Family Medicine | Admitting: Family Medicine

## 2015-05-02 DIAGNOSIS — Z113 Encounter for screening for infections with a predominantly sexual mode of transmission: Secondary | ICD-10-CM | POA: Insufficient documentation

## 2015-05-02 DIAGNOSIS — F79 Unspecified intellectual disabilities: Secondary | ICD-10-CM | POA: Diagnosis not present

## 2015-05-02 DIAGNOSIS — J301 Allergic rhinitis due to pollen: Secondary | ICD-10-CM | POA: Diagnosis not present

## 2015-05-02 DIAGNOSIS — N760 Acute vaginitis: Secondary | ICD-10-CM | POA: Insufficient documentation

## 2015-05-02 DIAGNOSIS — N390 Urinary tract infection, site not specified: Secondary | ICD-10-CM | POA: Diagnosis not present

## 2015-05-02 DIAGNOSIS — F2089 Other schizophrenia: Secondary | ICD-10-CM | POA: Diagnosis not present

## 2015-05-05 LAB — URINE CYTOLOGY ANCILLARY ONLY
Bacterial vaginitis: POSITIVE — AB
Candida vaginitis: NEGATIVE
Chlamydia: NEGATIVE
Neisseria Gonorrhea: NEGATIVE
Trichomonas: NEGATIVE

## 2015-05-22 DIAGNOSIS — Z3042 Encounter for surveillance of injectable contraceptive: Secondary | ICD-10-CM | POA: Diagnosis not present

## 2015-06-05 DIAGNOSIS — H2513 Age-related nuclear cataract, bilateral: Secondary | ICD-10-CM | POA: Diagnosis not present

## 2015-06-09 ENCOUNTER — Encounter (HOSPITAL_BASED_OUTPATIENT_CLINIC_OR_DEPARTMENT_OTHER): Payer: Self-pay

## 2015-06-09 ENCOUNTER — Emergency Department (HOSPITAL_BASED_OUTPATIENT_CLINIC_OR_DEPARTMENT_OTHER)
Admission: EM | Admit: 2015-06-09 | Discharge: 2015-06-09 | Disposition: A | Discharge status: Home / Self Care | Payer: Medicare Other | Attending: Emergency Medicine | Admitting: Emergency Medicine

## 2015-06-09 DIAGNOSIS — F329 Major depressive disorder, single episode, unspecified: Secondary | ICD-10-CM | POA: Insufficient documentation

## 2015-06-09 DIAGNOSIS — R51 Headache: Secondary | ICD-10-CM | POA: Insufficient documentation

## 2015-06-09 DIAGNOSIS — N898 Other specified noninflammatory disorders of vagina: Secondary | ICD-10-CM | POA: Insufficient documentation

## 2015-06-09 DIAGNOSIS — N390 Urinary tract infection, site not specified: Secondary | ICD-10-CM | POA: Diagnosis not present

## 2015-06-09 DIAGNOSIS — R300 Dysuria: Secondary | ICD-10-CM | POA: Diagnosis present

## 2015-06-09 LAB — URINALYSIS, ROUTINE W REFLEX MICROSCOPIC
Bilirubin Urine: NEGATIVE
Glucose, UA: NEGATIVE mg/dL
Hgb urine dipstick: NEGATIVE
Ketones, ur: NEGATIVE mg/dL
Nitrite: NEGATIVE
Protein, ur: NEGATIVE mg/dL
Specific Gravity, Urine: 1.015 (ref 1.005–1.030)
pH: 6.5 (ref 5.0–8.0)

## 2015-06-09 LAB — URINE MICROSCOPIC-ADD ON

## 2015-06-09 LAB — WET PREP, GENITAL
Clue Cells Wet Prep HPF POC: NONE SEEN
Sperm: NONE SEEN
Trich, Wet Prep: NONE SEEN
Yeast Wet Prep HPF POC: NONE SEEN

## 2015-06-09 LAB — PREGNANCY, URINE: Preg Test, Ur: NEGATIVE

## 2015-06-09 MED ORDER — ACETAMINOPHEN 325 MG PO TABS
650.0000 mg | ORAL_TABLET | Freq: Once | ORAL | Status: AC
  Administered 2015-06-09: 650 mg via ORAL
  Filled 2015-06-09: qty 2

## 2015-06-09 MED ORDER — SULFAMETHOXAZOLE-TRIMETHOPRIM 800-160 MG PO TABS
1.0000 | ORAL_TABLET | Freq: Once | ORAL | Status: AC
Start: 2015-06-09 — End: 2015-06-09
  Administered 2015-06-09: 1 via ORAL
  Filled 2015-06-09: qty 1

## 2015-06-09 MED ORDER — SULFAMETHOXAZOLE-TRIMETHOPRIM 800-160 MG PO TABS: 1.0000 | ORAL_TABLET | Freq: Two times a day (BID) | ORAL | Status: DC

## 2015-06-09 NOTE — ED Notes (Signed)
Dysuria x 1 week-caretaker with pt-pt NAD-steady gait

## 2015-06-09 NOTE — ED Provider Notes (Signed)
CSN: [id number]     Arrival date & time 06/09/15  1224 History   First MD Initiated Contact with Patient 06/09/15 1244     Chief Complaint  Patient presents with  . Dysuria    Level V caveat mental retardation. History is obtained from patient and from patient's caregiver who accompanies her (Consider location/radiation/quality/duration/timing/severity/associated sxs/prior Treatment) HPI Patient complains of dysuria for the past one week with burning on urination at urethral meatus. She's also had a vaginal discharge for several days. She was recently treated with an antibiotic for urinary tract infection for 5 days. No fever no abdominal pain no vomiting no other associated symptoms. Discomfort is worse with urination. Patient vehemently denies inappropriate touching or any sexual activiity Past Medical History  Diagnosis Date  . Mental retardation   . Post traumatic stress disorder   . Depressive disorder   . Psychotic disorder    Past Surgical History  Procedure Laterality Date  . Wrist surgery    . Back surgery     No family history on file. Social History  Substance Use Topics  . Smoking status: Never Smoker   . Smokeless tobacco: Never Used  . Alcohol Use: No   OB History    No data available     Review of Systems  Unable to perform ROS: Other  Genitourinary: Positive for dysuria and vaginal discharge.  Neurological: Positive for headaches.       Mild frontal headache presently      Allergies  Grifulvin v  Home Medications   Prior to Admission medications   Medication Sig Start Date End Date Taking? Authorizing Provider  Azelastine HCl (ASTEPRO) 0.15 % SOLN Place 2 sprays into the nose 2 (two) times daily. 02/16/15   Roselyn Malachy Moan, MD  celecoxib (CELEBREX) 100 MG capsule Take 100 mg by mouth daily.    Historical Provider, MD  divalproex (DEPAKOTE) 500 MG EC tablet Take 500 mg by mouth 2 (two) times daily. Takes 500mg  every morning and takes 1000mg  at bedtime     Historical Provider, MD  docusate sodium (COLACE) 100 MG capsule Take 100 mg by mouth daily.    Historical Provider, MD  fenofibrate (TRICOR) 145 MG tablet Take 145 mg by mouth daily.      Historical Provider, MD  fexofenadine (ALLEGRA) 180 MG tablet Take 1 tablet (180 mg total) by mouth daily. 02/16/15   Roselyn Malachy Moan, MD  FLUoxetine (PROZAC) 40 MG capsule Take 40 mg by mouth daily. Reported on 02/16/2015    Historical Provider, MD  levothyroxine (SYNTHROID, LEVOTHROID) 25 MCG tablet Take 25 mcg by mouth daily before breakfast.    Historical Provider, MD  lidocaine (XYLOCAINE) 2 % jelly Apply 1 application topically every 8 (eight) hours as needed. pain    Historical Provider, MD  lidocaine (XYLOCAINE) 5 % ointment Apply 1 application topically as needed. 03/28/15   Myeong O Sheard, DPM  LITHIUM CARBONATE PO Take 450 mg by mouth 2 (two) times daily.    Historical Provider, MD  montelukast (SINGULAIR) 10 MG tablet TAKE ONE TABLET BY MOUTH EACH EVENING TO PREVENT COUGH OR WHEEZE 03/06/15   Roselyn Malachy Moan, MD  omeprazole (PRILOSEC) 20 MG capsule Take 20 mg by mouth daily. 2 caps daily as needed    Historical Provider, MD  Psyllium (METAMUCIL PO) Take 5 g by mouth at bedtime.    Historical Provider, MD  QNASL 80 MCG/ACT AERS USE ONE TO TWO SPRAYS IN EACH NOSTRIL ONCE DAILY FOR  STUFFY NOSE OR DRAINAGE 04/14/15   Gean Quint, MD  QUEtiapine (SEROQUEL) 300 MG tablet Take 300 mg by mouth at bedtime.    Historical Provider, MD  sodium chloride (OCEAN) 0.65 % nasal spray Place 1 spray into the nose 3 (three) times daily.      Historical Provider, MD   BP 112/75 mmHg  Pulse 90  Temp(Src) 99.3 F (37.4 C) (Oral)  Resp 20  Ht 4\' 11"  (1.499 m)  Wt 178 lb (80.74 kg)  BMI 35.93 kg/m2  SpO2 99% Physical Exam  Constitutional: She appears well-developed and well-nourished.  HENT:  Head: Normocephalic and atraumatic.  Eyes: Conjunctivae are normal. Pupils are equal, round, and reactive to light.  Neck:  Neck supple. No tracheal deviation present. No thyromegaly present.  Cardiovascular: Normal rate and regular rhythm.   No murmur heard. Pulmonary/Chest: Effort normal and breath sounds normal.  Abdominal: Soft. Bowel sounds are normal. She exhibits no distension. There is no tenderness.  Obese  Genitourinary:  No external lesion. Thin watery vaginal discharge. Cervix not well visualized on speculum exam. No adnexal masses or tenderness. No tenderness on bimanual exam  Musculoskeletal: Normal range of motion. She exhibits no edema or tenderness.  Neurological: She is alert. Coordination normal.  Skin: Skin is warm and dry. No rash noted.  Psychiatric: She has a normal mood and affect.  Nursing note and vitals reviewed.   ED Course  Procedures (including critical care time) Labs Review Labs Reviewed  URINALYSIS, ROUTINE W REFLEX MICROSCOPIC (NOT AT [facility])  PREGNANCY, URINE    Imaging Review No results found. I have personally reviewed and evaluated these images and lab results as part of my medical decision-making.   EKG Interpretation None     Results for orders placed or performed during the hospital encounter of 06/09/15  Wet prep, genital  Result Value Ref Range   Yeast Wet Prep HPF POC NONE SEEN NONE SEEN   Trich, Wet Prep NONE SEEN NONE SEEN   Clue Cells Wet Prep HPF POC NONE SEEN NONE SEEN   WBC, Wet Prep HPF POC MODERATE (A) NONE SEEN   Sperm NONE SEEN   Urinalysis, Routine w reflex microscopic (not at [facility])  Result Value Ref Range   Color, Urine YELLOW YELLOW   APPearance CLOUDY (A) CLEAR   Specific Gravity, Urine 1.015 1.005 - 1.030   pH 6.5 5.0 - 8.0   Glucose, UA NEGATIVE NEGATIVE mg/dL   Hgb urine dipstick NEGATIVE NEGATIVE   Bilirubin Urine NEGATIVE NEGATIVE   Ketones, ur NEGATIVE NEGATIVE mg/dL   Protein, ur NEGATIVE NEGATIVE mg/dL   Nitrite NEGATIVE NEGATIVE   Leukocytes, UA MODERATE (A) NEGATIVE  Pregnancy, urine  Result Value Ref Range   Preg Test,  Ur NEGATIVE NEGATIVE  Urine microscopic-add on  Result Value Ref Range   Squamous Epithelial / LPF 0-5 (A) NONE SEEN   WBC, UA 6-30 0 - 5 WBC/hpf   RBC / HPF 0-5 0 - 5 RBC/hpf   Bacteria, UA MANY (A) NONE SEEN   No results found.  MDM  Discussed with hospital pharmacist. Bactrim DS is safe to take with other medications the patient takes Plan prescription Bactrim DS. Urine sent for culture. GC and chlamydia cultures pending. Keep scheduled appointment with the urologist 07/03/2015 Diagnosis urinary tract infection Final diagnoses:  None        Orlie Dakin, MD 06/09/15 [phone]

## 2015-06-09 NOTE — Discharge Instructions (Signed)
Urinary Tract Infection Take the antibiotic as prescribed. Urine has been sent for culture. If antibiotic needs to be changed you will be called. Keep scheduled appointment with urologist for 07/03/2015. A urinary tract infection (UTI) can occur any place along the urinary tract. The tract includes the kidneys, ureters, bladder, and urethra. A type of germ called bacteria often causes a UTI. UTIs are often helped with antibiotic medicine.  HOME CARE   If given, take antibiotics as told by your doctor. Finish them even if you start to feel better.  Drink enough fluids to keep your pee (urine) clear or pale yellow.  Avoid tea, drinks with caffeine, and bubbly (carbonated) drinks.  Pee often. Avoid holding your pee in for a long time.  Pee before and after having sex (intercourse).  Wipe from front to back after you poop (bowel movement) if you are a woman. Use each tissue only once. GET HELP RIGHT AWAY IF:   You have back pain.  You have lower belly (abdominal) pain.  You have chills.  You feel sick to your stomach (nauseous).  You throw up (vomit).  Your burning or discomfort with peeing does not go away.  You have a fever.  Your symptoms are not better in 3 days. MAKE SURE YOU:   Understand these instructions.  Will watch your condition.  Will get help right away if you are not doing well or get worse.   This information is not intended to replace advice given to you by your health care provider. Make sure you discuss any questions you have with your health care provider.   Document Released: 06/19/2007 Document Revised: 01/21/2014 Document Reviewed: 08/01/2011 Elsevier Interactive Patient Education [employer].

## 2015-06-10 LAB — URINE CULTURE: Special Requests: NORMAL

## 2015-06-13 LAB — GC/CHLAMYDIA PROBE AMP (~~LOC~~) NOT AT ARMC
Chlamydia: NEGATIVE
Neisseria Gonorrhea: NEGATIVE

## 2015-07-03 DIAGNOSIS — R8279 Other abnormal findings on microbiological examination of urine: Secondary | ICD-10-CM | POA: Diagnosis not present

## 2015-07-03 DIAGNOSIS — R3915 Urgency of urination: Secondary | ICD-10-CM | POA: Diagnosis not present

## 2015-07-03 DIAGNOSIS — N3000 Acute cystitis without hematuria: Secondary | ICD-10-CM | POA: Diagnosis not present

## 2015-07-07 DIAGNOSIS — F251 Schizoaffective disorder, depressive type: Secondary | ICD-10-CM | POA: Diagnosis not present

## 2015-07-07 DIAGNOSIS — F70 Mild intellectual disabilities: Secondary | ICD-10-CM | POA: Diagnosis not present

## 2015-07-07 DIAGNOSIS — F609 Personality disorder, unspecified: Secondary | ICD-10-CM | POA: Diagnosis not present

## 2015-07-17 DIAGNOSIS — Z79899 Other long term (current) drug therapy: Secondary | ICD-10-CM | POA: Diagnosis not present

## 2015-07-17 DIAGNOSIS — F251 Schizoaffective disorder, depressive type: Secondary | ICD-10-CM | POA: Diagnosis not present

## 2015-07-17 DIAGNOSIS — Z5181 Encounter for therapeutic drug level monitoring: Secondary | ICD-10-CM | POA: Diagnosis not present

## 2015-07-19 ENCOUNTER — Ambulatory Visit (INDEPENDENT_AMBULATORY_CARE_PROVIDER_SITE_OTHER): Payer: Medicare Other | Admitting: Podiatry

## 2015-07-19 ENCOUNTER — Encounter: Payer: Self-pay | Admitting: Podiatry

## 2015-07-19 VITALS — BP 113/72 | HR 85

## 2015-07-19 DIAGNOSIS — M722 Plantar fascial fibromatosis: Secondary | ICD-10-CM

## 2015-07-19 DIAGNOSIS — M659 Synovitis and tenosynovitis, unspecified: Secondary | ICD-10-CM | POA: Diagnosis not present

## 2015-07-19 DIAGNOSIS — M21969 Unspecified acquired deformity of unspecified lower leg: Secondary | ICD-10-CM | POA: Diagnosis not present

## 2015-07-19 NOTE — Progress Notes (Signed)
Subjective: 40 year old female came in with a care taker for the follow up on right foot pain. Using voltaren gel instead of Lidocaine cream. Still waiting of approval from her guardian for OTC orthotics. Stated that the heel is better now but hurts on top of right foot.   Objective: Severe elevated and hypermobile first ray bilateral. Pain in right heel. Pain in 2nd MCJ right dorsum. Pizogenic papule right medial heel with pain. No abnormal open skin lesions. Neurovascular status are within normal.   Previous Radiographic Report: Cavus type foot with severely elevated first Metatarsal bone with plantar and posterior calcaneal spur in lateral view bilateral. Normal findings in AP view.   Assessment: Plantar fasciitis right. Pizogenic papule right medial heel. Pain in right lower limb.  Metatarsus primus elevatus bilateral.  Plan:  Reviewed clinical findings and available treatment options. Reviewed the benefit of custom/OTC orthotics. Care taker will contact guardian to purchase non covered item, orthotics.  If not improved with Orthotics and injections, may require surgical intervention

## 2015-07-19 NOTE — Patient Instructions (Signed)
Seen for painful feet with ambulation. Noted of weak first metatarsal bone with pronating rearfoot. May benefit from orthotics (custom or OTC).. Continue with Voltaren gel as needed.

## 2015-08-07 DIAGNOSIS — R102 Pelvic and perineal pain: Secondary | ICD-10-CM | POA: Diagnosis not present

## 2015-08-07 DIAGNOSIS — K449 Diaphragmatic hernia without obstruction or gangrene: Secondary | ICD-10-CM | POA: Diagnosis not present

## 2015-08-07 DIAGNOSIS — N3000 Acute cystitis without hematuria: Secondary | ICD-10-CM | POA: Diagnosis not present

## 2015-08-07 DIAGNOSIS — Z3042 Encounter for surveillance of injectable contraceptive: Secondary | ICD-10-CM | POA: Diagnosis not present

## 2015-08-16 DIAGNOSIS — Z6836 Body mass index (BMI) 36.0-36.9, adult: Secondary | ICD-10-CM | POA: Diagnosis not present

## 2015-08-16 DIAGNOSIS — F79 Unspecified intellectual disabilities: Secondary | ICD-10-CM | POA: Diagnosis not present

## 2015-08-16 DIAGNOSIS — D649 Anemia, unspecified: Secondary | ICD-10-CM | POA: Diagnosis not present

## 2015-08-17 ENCOUNTER — Other Ambulatory Visit: Payer: Self-pay | Admitting: Allergy and Immunology

## 2015-08-21 DIAGNOSIS — R3915 Urgency of urination: Secondary | ICD-10-CM | POA: Diagnosis not present

## 2015-08-21 DIAGNOSIS — N3020 Other chronic cystitis without hematuria: Secondary | ICD-10-CM | POA: Diagnosis not present

## 2015-08-31 ENCOUNTER — Other Ambulatory Visit: Payer: Self-pay | Admitting: Allergy and Immunology

## 2015-09-12 DIAGNOSIS — Z1231 Encounter for screening mammogram for malignant neoplasm of breast: Secondary | ICD-10-CM | POA: Diagnosis not present

## 2015-09-12 DIAGNOSIS — Z124 Encounter for screening for malignant neoplasm of cervix: Secondary | ICD-10-CM | POA: Diagnosis not present

## 2015-09-12 DIAGNOSIS — Z01419 Encounter for gynecological examination (general) (routine) without abnormal findings: Secondary | ICD-10-CM | POA: Diagnosis not present

## 2015-09-25 ENCOUNTER — Other Ambulatory Visit: Payer: Self-pay

## 2015-09-25 ENCOUNTER — Ambulatory Visit: Payer: Self-pay | Admitting: Family

## 2015-10-19 ENCOUNTER — Encounter: Payer: Self-pay | Admitting: Allergy & Immunology

## 2015-10-19 ENCOUNTER — Encounter (INDEPENDENT_AMBULATORY_CARE_PROVIDER_SITE_OTHER): Payer: Self-pay

## 2015-10-19 ENCOUNTER — Ambulatory Visit (INDEPENDENT_AMBULATORY_CARE_PROVIDER_SITE_OTHER): Payer: Medicare Other | Admitting: Allergy & Immunology

## 2015-10-19 VITALS — BP 112/72 | HR 81 | Temp 98.8°F | Resp 17 | Wt 177.6 lb

## 2015-10-19 DIAGNOSIS — J019 Acute sinusitis, unspecified: Secondary | ICD-10-CM

## 2015-10-19 DIAGNOSIS — J310 Chronic rhinitis: Secondary | ICD-10-CM

## 2015-10-19 MED ORDER — CETIRIZINE HCL 10 MG PO TABS: 10.0000 mg | ORAL_TABLET | Freq: Every day | ORAL | 12 refills | Status: DC

## 2015-10-19 MED ORDER — AMOXICILLIN-POT CLAVULANATE 875-125 MG PO TABS: 1.0000 | ORAL_TABLET | Freq: Two times a day (BID) | ORAL | 0 refills | Status: AC

## 2015-10-19 NOTE — Progress Notes (Signed)
FOLLOW UP  Date of Service/Encounter:  10/19/15   Assessment:   Mixed rhinitis  Acute sinusitis, recurrence not specified, unspecified location    Plan/Recommendations:   1. Mixed rhinitis - Change from Allegra to cetirizine (Zyrtec) 10mg  at night. - Continue the nasal sprays as prescribed. - Continue montelukast as prescribed.  2. Sinus infection - Start Augmentin 875mg  one tablet twice daily for 14 days. - Consider nasal rinses as well.  - Use moisturizers for the skin breakdown around your ears.   3. Return to clinic in in six months.     Subjective:   Sonya Moran is a 40 y.o. female presenting today for follow up of  Chief Complaint  Patient presents with  . Follow-up    bilateral ear pain, and runny nose.   Sonya Moran has a history of the following: Patient Active Problem List   Diagnosis Date Noted  . Plantar fasciitis of right foot 03/28/2015  . Metatarsal deformity 03/28/2015  . Calcaneal spur 03/28/2015  . Anemia 07/23/2013  . Onychomycosis 06/24/2012    History obtained from: chart review and patient and patient's caregiver [place]).  Sonya Moran was referred by Elyn Peers, MD.     Sonya Moran is a 40 y.o. female presenting for a follow up visit for rhinitis. [place] was last seen in February 2017 by Dr. Ishmael Holter, who has since left the practice. At that time, she was started on Allegra 180 mg daily as well as Astepro 2 sprays per nostril 1-2 times daily and Qnasl one to 2 sprays daily. She was continued on Singulair. She has a history of allergies to dust mites and molds.  Since last visit, she reports that she is doing well. She remains on all of her medications including the two nasal sprays and Singulair and Allegra. She is doing well with this regimen. She is interested in changing from the Allegra to $15 per month. She is complaining of ear pain that has been ongoing for weeks in conjunction with sinus pain and nasal discharge. She has had no  fevers. She does endorse a wet cough. She does not have a history of asthma and has never used an inhaler. She is otherwise doing well. She lives in a group home setting and does attend a day program here in [place].   Otherwise, there have been no changes to the past medical history, surgical history, family history, or social history.     Review of Systems: a 14-point review of systems is pertinent for what is mentioned in HPI.  Otherwise, all other systems were negative. Constitutional: negative other than that listed in the HPI Eyes: negative other than that listed in the HPI Ears, nose, mouth, throat, and face: negative other than that listed in the HPI Respiratory: negative other than that listed in the HPI Cardiovascular: negative other than that listed in the HPI Gastrointestinal: negative other than that listed in the HPI Genitourinary: negative other than that listed in the HPI Integument: negative other than that listed in the HPI Hematologic: negative other than that listed in the HPI Musculoskeletal: negative other than that listed in the HPI Neurological: negative other than that listed in the HPI Allergy/Immunologic: negative other than that listed in the HPI    Objective:   Blood pressure 112/72, pulse 81, temperature 98.8 F (37.1 C), temperature source Oral, resp. rate 17, weight 177 lb 9.6 oz (80.6 kg). Body mass index is 35.87 kg/m.   Physical Exam:  General: Alert, interactive, in no acute distress.Somewhat delayed but responsive to questions.  HEENT: TMs pearly gray, turbinates edematous with thick discharge, post-pharynx erythematous. Neck: Supple without thyromegaly.  Lungs: Clear to auscultation without wheezing, rhonchi or rales. No increased work of breathing. CV: Normal S1, S2 without murmurs. Capillary refill <2 seconds.  Abdomen: Nondistended, nontender. Skin: Warm and dry, without lesions or rashes. Extremities:  No clubbing, cyanosis or  edema. Neuro:   Grossly intact.   Diagnostic studies: None    Salvatore Marvel, MD [facility]

## 2015-10-19 NOTE — Patient Instructions (Signed)
1. Mixed rhinitis - Change from Allegra to cetirizine (Zyrtec) 10mg  at night. - Continue the nasal sprays as prescribed. - Continue montelukast as prescribed.  2. Sinus infection - Start Augmentin 875mg  one tablet twice daily for 14 days. - Consider nasal rinses as well.  - Use moisturizers for the skin breakdown around your ears.   3. No Follow-up on file.  Please inform us of any Emergency Department visits, hospitalizations, or changes in symptoms. Call us before going to the ED for breathing or allergy symptoms since we might be able to fit you in for a sick visit. Feel free to contact us anytime with any questions, problems, or concerns.  It was a pleasure to meet you today!   Websites that have reliable patient information: 1. American Academy of Asthma, Allergy, and Immunology: www.aaaai.org 2. Food Allergy Research and Education (FARE): foodallergy.org 3. Mothers of Asthmatics: http://www.asthmacommunitynetwork.org 4. American College of Allergy, Asthma, and Immunology: www.acaai.org

## 2015-10-23 ENCOUNTER — Encounter (HOSPITAL_COMMUNITY): Payer: Self-pay | Admitting: Emergency Medicine

## 2015-10-23 ENCOUNTER — Emergency Department (HOSPITAL_COMMUNITY)
Admission: EM | Admit: 2015-10-23 | Discharge: 2015-10-23 | Disposition: A | Discharge status: Home / Self Care | Payer: Medicare Other | Attending: Emergency Medicine | Admitting: Emergency Medicine

## 2015-10-23 DIAGNOSIS — R42 Dizziness and giddiness: Secondary | ICD-10-CM | POA: Insufficient documentation

## 2015-10-23 DIAGNOSIS — R404 Transient alteration of awareness: Secondary | ICD-10-CM | POA: Diagnosis not present

## 2015-10-23 DIAGNOSIS — Z79899 Other long term (current) drug therapy: Secondary | ICD-10-CM | POA: Diagnosis not present

## 2015-10-23 LAB — BASIC METABOLIC PANEL
Anion gap: 3 — ABNORMAL LOW (ref 5–15)
BUN: 12 mg/dL (ref 6–20)
CO2: 24 mmol/L (ref 22–32)
Calcium: 9.5 mg/dL (ref 8.9–10.3)
Chloride: 109 mmol/L (ref 101–111)
Creatinine, Ser: 0.75 mg/dL (ref 0.44–1.00)
GFR calc Af Amer: >60 mL/min (ref >60)
GFR calc non Af Amer: >60 mL/min (ref >60)
Glucose, Bld: 156 mg/dL — ABNORMAL HIGH (ref 65–99)
Potassium: 3.4 mmol/L — ABNORMAL LOW (ref 3.5–5.1)
Sodium: 136 mmol/L (ref 135–145)

## 2015-10-23 LAB — CBC
HCT: 30.7 % — ABNORMAL LOW (ref 36.0–46.0)
Hemoglobin: 9.8 g/dL — ABNORMAL LOW (ref 12.0–15.0)
MCH: 32.3 pg (ref 26.0–34.0)
MCHC: 31.9 g/dL (ref 30.0–36.0)
MCV: 101.3 fL — ABNORMAL HIGH (ref 78.0–100.0)
Platelets: 100 10*3/uL — ABNORMAL LOW (ref 150–400)
RBC: 3.03 MIL/uL — ABNORMAL LOW (ref 3.87–5.11)
RDW: 12.8 % (ref 11.5–15.5)
WBC: 3.4 10*3/uL — ABNORMAL LOW (ref 4.0–10.5)

## 2015-10-23 LAB — LITHIUM LEVEL: Lithium Lvl: 0.69 mmol/L (ref 0.60–1.20)

## 2015-10-23 LAB — URINALYSIS, ROUTINE W REFLEX MICROSCOPIC
Bilirubin Urine: NEGATIVE
Glucose, UA: NEGATIVE mg/dL
Hgb urine dipstick: NEGATIVE
Ketones, ur: NEGATIVE mg/dL
Nitrite: NEGATIVE
Protein, ur: NEGATIVE mg/dL
Specific Gravity, Urine: 1.01 (ref 1.005–1.030)
pH: 7 (ref 5.0–8.0)

## 2015-10-23 LAB — URINE MICROSCOPIC-ADD ON

## 2015-10-23 LAB — CBG MONITORING, ED: Glucose-Capillary: 165 mg/dL — ABNORMAL HIGH (ref 65–99)

## 2015-10-23 NOTE — ED Provider Notes (Signed)
[facility] DEPT Provider Note   CSN: [id number] Arrival date & time: 10/23/15  [id number]     History   Chief Complaint Chief Complaint  Patient presents with  . Dizziness    HPI Sonya Moran is a 40 y.o. female.  HPI Patient presents after an episode of dizziness. Scribe state that she feels generally well. Patient has cognitive delay, but does provide details of her current state of health reasonably well. Patient states that she's been no generally well aside from mild allergy symptoms, typical for her. Today, just prior to ED arrival the patient had an episode of dizziness. This has resolved. She notes that she has had no changes in her chronic medication, but over the past 3 days has been taking new multiple medications for allergy relief. Currently she denies chest pain, belly pain, confusion, disorientation. She does have mild headache, not unusual for her.  Past Medical History:  Diagnosis Date  . Depressive disorder   . Mental retardation   . Post traumatic stress disorder   . Psychotic disorder     Patient Active Problem List   Diagnosis Date Noted  . Plantar fasciitis of right foot 03/28/2015  . Metatarsal deformity 03/28/2015  . Calcaneal spur 03/28/2015  . Anemia 07/23/2013  . Onychomycosis 06/24/2012    Past Surgical History:  Procedure Laterality Date  . BACK SURGERY    . WRIST SURGERY      OB History    No data available       Home Medications    Prior to Admission medications   Medication Sig Start Date End Date Taking? Authorizing Provider  amoxicillin-clavulanate (AUGMENTIN) 875-125 MG tablet Take 1 tablet by mouth 2 (two) times daily. 10/19/15 11/02/15  Valentina Shaggy, MD  Azelastine HCl (ASTEPRO) 0.15 % SOLN Place 2 sprays into the nose 2 (two) times daily. 02/16/15   Roselyn Malachy Moan, MD  celecoxib (CELEBREX) 100 MG capsule Take 100 mg by mouth daily.    Historical Provider, MD  cetirizine (ZYRTEC) 10 MG tablet Take 1 tablet (10  mg total) by mouth daily. 10/19/15 11/18/15  Valentina Shaggy, MD  diclofenac sodium (VOLTAREN) 1 % GEL Apply topically as needed.    Historical Provider, MD  divalproex (DEPAKOTE) 500 MG EC tablet Take 500 mg by mouth 2 (two) times daily. Takes 500mg  every morning and takes 1000mg  at bedtime    Historical Provider, MD  docusate sodium (COLACE) 100 MG capsule Take 100 mg by mouth daily.    Historical Provider, MD  fenofibrate (TRICOR) 145 MG tablet Take 145 mg by mouth daily.      Historical Provider, MD  fexofenadine (ALLEGRA) 180 MG tablet TAKE 1 TABLET(180 MG) BY MOUTH DAILY 08/17/15   Valentina Shaggy, MD  FLUoxetine (PROZAC) 40 MG capsule Take 40 mg by mouth daily. Reported on 02/16/2015    Historical Provider, MD  levothyroxine (SYNTHROID, LEVOTHROID) 25 MCG tablet Take 25 mcg by mouth daily before breakfast.    Historical Provider, MD  LITHIUM CARBONATE PO Take 450 mg by mouth 2 (two) times daily.    Historical Provider, MD  mirabegron ER (MYRBETRIQ) 25 MG TB24 tablet Take 25 mg by mouth.    Historical Provider, MD  montelukast (SINGULAIR) 10 MG tablet TAKE ONE TABLET BY MOUTH EACH EVENING TO PREVENT COUGH OR WHEEZE 08/31/15   Shaylar Charmian Muff, MD  omeprazole (PRILOSEC) 20 MG capsule Take 20 mg by mouth daily. 2 caps daily as needed    Historical  Provider, MD  Psyllium (METAMUCIL PO) Take 5 g by mouth at bedtime.    Historical Provider, MD  QNASL 80 MCG/ACT AERS USE ONE TO TWO SPRAYS IN EACH NOSTRIL ONCE DAILY FOR STUFFY NOSE OR DRAINAGE 04/14/15   Roselyn Malachy Moan, MD  QUEtiapine (SEROQUEL) 300 MG tablet Take 300 mg by mouth at bedtime.    Historical Provider, MD    Family History No family history on file.  Social History Social History  Substance Use Topics  . Smoking status: Never Smoker  . Smokeless tobacco: Never Used  . Alcohol use No     Allergies   Grifulvin v [griseofulvin]   Review of Systems Review of Systems  Constitutional:       Per HPI, otherwise  negative  HENT:       Per HPI, otherwise negative  Respiratory:       Per HPI, otherwise negative  Cardiovascular:       Per HPI, otherwise negative  Gastrointestinal: Negative for vomiting.  Endocrine:       Negative aside from HPI  Genitourinary:       Neg aside from HPI   Musculoskeletal:       Per HPI, otherwise negative  Skin: Negative.   Neurological: Positive for dizziness and headaches. Negative for syncope.  Psychiatric/Behavioral:       History of present illness cognitive delay     Physical Exam Updated Vital Signs BP 113/78 (BP Location: Right Arm)   Pulse 92   Temp 98.9 F (37.2 C) (Oral)   Resp 16   Wt 177 lb (80.3 kg)   SpO2 99%   BMI 35.75 kg/m   Physical Exam  Constitutional: She is oriented to person, place, and time. She appears well-developed and well-nourished. No distress.  HENT:  Head: Normocephalic and atraumatic.  Eyes: Conjunctivae and EOM are normal.  Cardiovascular: Normal rate and regular rhythm.   Pulmonary/Chest: Effort normal and breath sounds normal. No stridor. No respiratory distress.  Abdominal: She exhibits no distension.  Musculoskeletal: She exhibits no edema.  Neurological: She is alert and oriented to person, place, and time. She displays no atrophy and no tremor. No cranial nerve deficit. She exhibits normal muscle tone. She displays no seizure activity.  Skin: Skin is warm and dry.  Psychiatric: She has a normal mood and affect. Cognition and memory are impaired.  Cognitive impairment, but pleasant, speaks clearly, briefly, answers questions appropriately  Nursing note and vitals reviewed.    ED Treatments / Results  Labs (all labs ordered are listed, but only abnormal results are displayed) Labs Reviewed  CBG MONITORING, ED - Abnormal; Notable for the following:       Result Value   Glucose-Capillary 165 (*)    All other components within normal limits  BASIC METABOLIC PANEL  CBC  URINALYSIS, ROUTINE W REFLEX  MICROSCOPIC (NOT AT [facility])  LITHIUM LEVEL    EKG  EKG Interpretation  Date/Time:  Monday October 23 2015 09:54:49 EDT Ventricular Rate:  88 PR Interval:    QRS Duration: 99 QT Interval:  366 QTC Calculation: 443 R Axis:   32 Text Interpretation:  Sinus rhythm Borderline T wave abnormalities Baseline wander Abnormal ekg Confirmed by Carmin Muskrat  MD ([id number]) on 10/23/2015 10:18:51 AM      Cardiac monitor 90s sinus normal  Pulse ox 100% room air normal  Radiology No results found.  Procedures Procedures (including critical care time)  Medications Ordered in ED Medications - No data to  display   Initial Impression / Assessment and Plan / ED Course  I have reviewed the triage vital signs and the nursing notes.  Pertinent labs & imaging results that were available during my care of the patient were reviewed by me and considered in my medical decision making (see chart for details).  Clinical Course   Patient presents after an episode of dizziness, resolved. Here she is awake and alert, with no ongoing complaints beyond mild headache. Patient received Tylenol for headache control, and there is no evidence for neurologic dysfunction. Dizziness may be secondary to medications, and was otherwise reassuring labs, EKG, physical exam, patient discharged to her living facility.    Carmin Muskrat, MD 10/23/15 1019

## 2015-10-23 NOTE — ED Triage Notes (Signed)
Per EMS pt complaint of dizziness en route to "the center" today. Pt new allergy medication starting this past Friday.

## 2015-10-23 NOTE — Discharge Instructions (Signed)
As discussed, your evaluation today has been largely reassuring.  But, it is important that you monitor your condition carefully, and do not hesitate to return to the ED if you develop new, or concerning changes in your condition. ? ?Otherwise, please follow-up with your physician for appropriate ongoing care. ? ?

## 2015-10-23 NOTE — ED Notes (Signed)
Bed: [id number] Expected date:  Expected time:  Means of arrival:  Comments: EMS- 40yo F, dizziness

## 2015-10-26 DIAGNOSIS — Z3042 Encounter for surveillance of injectable contraceptive: Secondary | ICD-10-CM | POA: Diagnosis not present

## 2015-11-01 DIAGNOSIS — B373 Candidiasis of vulva and vagina: Secondary | ICD-10-CM | POA: Diagnosis not present

## 2015-11-14 DIAGNOSIS — J029 Acute pharyngitis, unspecified: Secondary | ICD-10-CM | POA: Diagnosis not present

## 2015-12-14 DIAGNOSIS — J029 Acute pharyngitis, unspecified: Secondary | ICD-10-CM | POA: Diagnosis not present

## 2015-12-27 DIAGNOSIS — F609 Personality disorder, unspecified: Secondary | ICD-10-CM | POA: Diagnosis not present

## 2015-12-27 DIAGNOSIS — F70 Mild intellectual disabilities: Secondary | ICD-10-CM | POA: Diagnosis not present

## 2015-12-27 DIAGNOSIS — F251 Schizoaffective disorder, depressive type: Secondary | ICD-10-CM | POA: Diagnosis not present

## 2015-12-28 DIAGNOSIS — L299 Pruritus, unspecified: Secondary | ICD-10-CM | POA: Diagnosis not present

## 2016-01-11 DIAGNOSIS — Z3042 Encounter for surveillance of injectable contraceptive: Secondary | ICD-10-CM | POA: Diagnosis not present

## 2016-01-25 DIAGNOSIS — J029 Acute pharyngitis, unspecified: Secondary | ICD-10-CM | POA: Diagnosis not present

## 2016-01-25 DIAGNOSIS — J399 Disease of upper respiratory tract, unspecified: Secondary | ICD-10-CM | POA: Diagnosis not present

## 2016-01-25 DIAGNOSIS — F79 Unspecified intellectual disabilities: Secondary | ICD-10-CM | POA: Diagnosis not present

## 2016-02-04 DIAGNOSIS — J45909 Unspecified asthma, uncomplicated: Secondary | ICD-10-CM | POA: Diagnosis not present

## 2016-02-04 DIAGNOSIS — F79 Unspecified intellectual disabilities: Secondary | ICD-10-CM | POA: Diagnosis not present

## 2016-02-04 DIAGNOSIS — E669 Obesity, unspecified: Secondary | ICD-10-CM | POA: Diagnosis not present

## 2016-02-04 DIAGNOSIS — F209 Schizophrenia, unspecified: Secondary | ICD-10-CM | POA: Diagnosis not present

## 2016-02-04 DIAGNOSIS — S79911A Unspecified injury of right hip, initial encounter: Secondary | ICD-10-CM | POA: Diagnosis not present

## 2016-02-04 DIAGNOSIS — K219 Gastro-esophageal reflux disease without esophagitis: Secondary | ICD-10-CM | POA: Diagnosis not present

## 2016-02-04 DIAGNOSIS — E785 Hyperlipidemia, unspecified: Secondary | ICD-10-CM | POA: Diagnosis not present

## 2016-02-04 DIAGNOSIS — M79604 Pain in right leg: Secondary | ICD-10-CM | POA: Diagnosis not present

## 2016-02-04 DIAGNOSIS — R52 Pain, unspecified: Secondary | ICD-10-CM | POA: Diagnosis not present

## 2016-02-04 DIAGNOSIS — S7011XA Contusion of right thigh, initial encounter: Secondary | ICD-10-CM | POA: Diagnosis not present

## 2016-02-04 DIAGNOSIS — M25551 Pain in right hip: Secondary | ICD-10-CM | POA: Diagnosis not present

## 2016-02-05 DIAGNOSIS — K219 Gastro-esophageal reflux disease without esophagitis: Secondary | ICD-10-CM | POA: Diagnosis not present

## 2016-03-11 ENCOUNTER — Other Ambulatory Visit: Payer: Self-pay

## 2016-03-11 DIAGNOSIS — R21 Rash and other nonspecific skin eruption: Secondary | ICD-10-CM | POA: Diagnosis not present

## 2016-03-11 MED ORDER — AZELASTINE HCL 0.15 % NA SOLN: 2.0000 | Freq: Two times a day (BID) | NASAL | 3 refills | Status: DC

## 2016-03-28 DIAGNOSIS — Z3042 Encounter for surveillance of injectable contraceptive: Secondary | ICD-10-CM | POA: Diagnosis not present

## 2016-04-09 DIAGNOSIS — J399 Disease of upper respiratory tract, unspecified: Secondary | ICD-10-CM | POA: Diagnosis not present

## 2016-04-09 DIAGNOSIS — J029 Acute pharyngitis, unspecified: Secondary | ICD-10-CM | POA: Diagnosis not present

## 2016-04-09 DIAGNOSIS — F79 Unspecified intellectual disabilities: Secondary | ICD-10-CM | POA: Diagnosis not present

## 2016-04-11 DIAGNOSIS — N949 Unspecified condition associated with female genital organs and menstrual cycle: Secondary | ICD-10-CM | POA: Diagnosis not present

## 2016-04-11 DIAGNOSIS — R300 Dysuria: Secondary | ICD-10-CM | POA: Diagnosis not present

## 2016-04-11 DIAGNOSIS — N946 Dysmenorrhea, unspecified: Secondary | ICD-10-CM | POA: Diagnosis not present

## 2016-05-09 DIAGNOSIS — H10413 Chronic giant papillary conjunctivitis, bilateral: Secondary | ICD-10-CM | POA: Diagnosis not present

## 2016-05-09 DIAGNOSIS — H2513 Age-related nuclear cataract, bilateral: Secondary | ICD-10-CM | POA: Diagnosis not present

## 2016-05-14 ENCOUNTER — Emergency Department (HOSPITAL_BASED_OUTPATIENT_CLINIC_OR_DEPARTMENT_OTHER): Payer: Medicare Other

## 2016-05-14 ENCOUNTER — Encounter (HOSPITAL_BASED_OUTPATIENT_CLINIC_OR_DEPARTMENT_OTHER): Payer: Self-pay | Admitting: Emergency Medicine

## 2016-05-14 ENCOUNTER — Emergency Department (HOSPITAL_BASED_OUTPATIENT_CLINIC_OR_DEPARTMENT_OTHER)
Admission: EM | Admit: 2016-05-14 | Discharge: 2016-05-14 | Disposition: A | Discharge status: Home / Self Care | Payer: Medicare Other | Attending: Emergency Medicine | Admitting: Emergency Medicine

## 2016-05-14 DIAGNOSIS — Y929 Unspecified place or not applicable: Secondary | ICD-10-CM | POA: Insufficient documentation

## 2016-05-14 DIAGNOSIS — T148XXA Other injury of unspecified body region, initial encounter: Secondary | ICD-10-CM | POA: Diagnosis not present

## 2016-05-14 DIAGNOSIS — X58XXXA Exposure to other specified factors, initial encounter: Secondary | ICD-10-CM | POA: Insufficient documentation

## 2016-05-14 DIAGNOSIS — Y9389 Activity, other specified: Secondary | ICD-10-CM | POA: Diagnosis not present

## 2016-05-14 DIAGNOSIS — Y998 Other external cause status: Secondary | ICD-10-CM | POA: Diagnosis not present

## 2016-05-14 DIAGNOSIS — S76012A Strain of muscle, fascia and tendon of left hip, initial encounter: Secondary | ICD-10-CM | POA: Diagnosis not present

## 2016-05-14 DIAGNOSIS — S8992XA Unspecified injury of left lower leg, initial encounter: Secondary | ICD-10-CM | POA: Diagnosis present

## 2016-05-14 DIAGNOSIS — S86912A Strain of unspecified muscle(s) and tendon(s) at lower leg level, left leg, initial encounter: Secondary | ICD-10-CM | POA: Diagnosis not present

## 2016-05-14 DIAGNOSIS — M25562 Pain in left knee: Secondary | ICD-10-CM | POA: Diagnosis not present

## 2016-05-14 DIAGNOSIS — M25552 Pain in left hip: Secondary | ICD-10-CM | POA: Diagnosis not present

## 2016-05-14 MED ORDER — IBUPROFEN 400 MG PO TABS
600.0000 mg | ORAL_TABLET | Freq: Once | ORAL | Status: AC
  Administered 2016-05-14: 600 mg via ORAL
  Filled 2016-05-14: qty 1

## 2016-05-14 MED ORDER — IBUPROFEN 400 MG PO TABS: 400.0000 mg | ORAL_TABLET | Freq: Four times a day (QID) | ORAL | 0 refills | Status: DC | PRN

## 2016-05-14 MED FILL — IBUPROFEN 400 MG TABLET: 400 | 4 days supply | Qty: 15 | Fill #0

## 2016-05-14 NOTE — ED Provider Notes (Signed)
[facility] DEPT [facility] Provider Note   CSN: [id number] Arrival date & time: 05/14/16  0903     History   Chief Complaint Chief Complaint  Patient presents with  . Leg Pain    HPI Sonya Moran is a 41 y.o. female with history of mental retardation who presents with left leg pain that began after playing kickball 2 days ago. Patient states the pain is worse with walking. She denies any numbness or tingling. Her pain is worse in her upper thigh and knee. Patient has taken Tylenol at home with some relief. She denies any other injuries. No fevers.  HPI  Past Medical History:  Diagnosis Date  . Depressive disorder   . Mental retardation   . Post traumatic stress disorder   . Psychotic disorder     Patient Active Problem List   Diagnosis Date Noted  . Plantar fasciitis of right foot 03/28/2015  . Metatarsal deformity 03/28/2015  . Calcaneal spur 03/28/2015  . Anemia 07/23/2013  . Onychomycosis 06/24/2012    Past Surgical History:  Procedure Laterality Date  . BACK SURGERY    . WRIST SURGERY      OB History    No data available       Home Medications    Prior to Admission medications   Medication Sig Start Date End Date Taking? Authorizing Provider  Azelastine HCl (ASTEPRO) 0.15 % SOLN Place 2 sprays into the nose 2 (two) times daily. 03/11/16   Valentina Shaggy, MD  celecoxib (CELEBREX) 100 MG capsule Take 100 mg by mouth daily.    Historical Provider, MD  cetirizine (ZYRTEC) 10 MG tablet Take 1 tablet (10 mg total) by mouth daily. 10/19/15 11/18/15  Valentina Shaggy, MD  diclofenac sodium (VOLTAREN) 1 % GEL Apply topically as needed.    Historical Provider, MD  divalproex (DEPAKOTE) 500 MG EC tablet Take 500 mg by mouth 2 (two) times daily. Takes 500mg  every morning and takes 1000mg  at bedtime    Historical Provider, MD  docusate sodium (COLACE) 100 MG capsule Take 100 mg by mouth daily.    Historical Provider, MD  fenofibrate (TRICOR) 145 MG tablet Take  145 mg by mouth daily.      Historical Provider, MD  fexofenadine (ALLEGRA) 180 MG tablet TAKE 1 TABLET(180 MG) BY MOUTH DAILY 08/17/15   Valentina Shaggy, MD  FLUoxetine (PROZAC) 40 MG capsule Take 40 mg by mouth daily. Reported on 02/16/2015    Historical Provider, MD  ibuprofen (ADVIL,MOTRIN) 400 MG tablet Take 1 tablet (400 mg total) by mouth every 6 (six) hours as needed. 05/14/16   Frederica Kuster, PA-C  levothyroxine (SYNTHROID, LEVOTHROID) 25 MCG tablet Take 25 mcg by mouth daily before breakfast.    Historical Provider, MD  LITHIUM CARBONATE PO Take 450 mg by mouth 2 (two) times daily.    Historical Provider, MD  mirabegron ER (MYRBETRIQ) 25 MG TB24 tablet Take 25 mg by mouth.    Historical Provider, MD  montelukast (SINGULAIR) 10 MG tablet TAKE ONE TABLET BY MOUTH EACH EVENING TO PREVENT COUGH OR WHEEZE 08/31/15   Shaylar Charmian Muff, MD  omeprazole (PRILOSEC) 20 MG capsule Take 20 mg by mouth daily. 2 caps daily as needed    Historical Provider, MD  Psyllium (METAMUCIL PO) Take 5 g by mouth at bedtime.    Historical Provider, MD  QNASL 80 MCG/ACT AERS USE ONE TO TWO SPRAYS IN EACH NOSTRIL ONCE DAILY FOR STUFFY NOSE OR DRAINAGE 04/14/15  Roselyn Malachy Moan, MD  QUEtiapine (SEROQUEL) 300 MG tablet Take 300 mg by mouth at bedtime.    Historical Provider, MD    Family History No family history on file.  Social History Social History  Substance Use Topics  . Smoking status: Never Smoker  . Smokeless tobacco: Never Used  . Alcohol use No     Allergies   Grifulvin v [griseofulvin]   Review of Systems Review of Systems  Constitutional: Negative for fever.  Musculoskeletal: Positive for myalgias.  Neurological: Negative for numbness.     Physical Exam Updated Vital Signs BP 123/77 (BP Location: Right Arm)   Pulse 88   Temp 98.4 F (36.9 C) (Oral)   Resp 20   Ht 4\' 11"  (1.499 m)   Wt 79.8 kg   SpO2 98%   BMI 35.55 kg/m   Physical Exam  Constitutional: She appears  well-developed and well-nourished. No distress.  HENT:  Head: Normocephalic and atraumatic.  Mouth/Throat: Oropharynx is clear and moist. No oropharyngeal exudate.  Eyes: Conjunctivae are normal. Pupils are equal, round, and reactive to light. Right eye exhibits no discharge. Left eye exhibits no discharge. No scleral icterus.  Neck: Normal range of motion. Neck supple. No thyromegaly present.  Cardiovascular: Normal rate, regular rhythm, normal heart sounds and intact distal pulses.  Exam reveals no gallop and no friction rub.   No murmur heard. Pulmonary/Chest: Effort normal and breath sounds normal. No stridor. No respiratory distress. She has no wheezes. She has no rales.  Abdominal: Soft. Bowel sounds are normal. She exhibits no distension. There is no tenderness. There is no rebound and no guarding.  Musculoskeletal: She exhibits no edema.       Left knee: She exhibits no swelling, no LCL laxity and no MCL laxity. Tenderness found.       Legs: Tenderness to left lower extremity has shown; normal sensation; 5/5 strength to extremities; full range of motion; DT pulses intact  Lymphadenopathy:    She has no cervical adenopathy.  Neurological: She is alert. Coordination normal.  Skin: Skin is warm and dry. No rash noted. She is not diaphoretic. No pallor.  Psychiatric: She has a normal mood and affect.  Nursing note and vitals reviewed.    ED Treatments / Results  Labs (all labs ordered are listed, but only abnormal results are displayed) Labs Reviewed - No data to display  EKG  EKG Interpretation None       Radiology Dg Knee Complete 4 Views Left  Result Date: 05/14/2016 CLINICAL DATA:  Left knee pain following exercise 2 days ago, initial encounter EXAM: LEFT KNEE - COMPLETE 4+ VIEW COMPARISON:  None. FINDINGS: Well corticated bony densities noted adjacent to the lateral aspect of the patella which may be related to prior trauma with nonunion. No acute fracture or dislocation  is noted. No joint effusion is seen. Mild patellofemoral degenerative changes are noted. IMPRESSION: Chronic changes without acute abnormality. Electronically Signed   By: Inez Catalina M.D.   On: 05/14/2016 10:08   Dg Hip Unilat W Or Wo Pelvis 2-3 Views Left  Result Date: 05/14/2016 CLINICAL DATA:  Left hip pain following exercise 2 days ago, initial encounter EXAM: DG HIP (WITH OR WITHOUT PELVIS) 2-3V LEFT COMPARISON:  None. FINDINGS: The pelvic ring is intact. Mild degenerative changes of the hip joints are noted bilaterally. No acute fracture or dislocation is seen. No soft tissue abnormality is noted. IMPRESSION: Mild degenerative change is noted without acute bony abnormality Electronically Signed  By: Inez Catalina M.D.   On: 05/14/2016 10:12    Procedures Procedures (including critical care time)  Medications Ordered in ED Medications  ibuprofen (ADVIL,MOTRIN) tablet 600 mg (600 mg Oral Given 05/14/16 1006)     Initial Impression / Assessment and Plan / ED Course  I have reviewed the triage vital signs and the nursing notes.  Pertinent labs & imaging results that were available during my care of the patient were reviewed by me and considered in my medical decision making (see chart for details).     Patient with probable muscle strain or soreness from kickball. X-rays of hip and knee show no acute abnormality. No suspicion for DVT. Patient has a convincing story for delayed onset muscle soreness. Patient's symptoms improved with ibuprofen in the ED. We'll discharge home with short course with follow-up to Dr. Barbaraann Barthel if symptoms are not improving. I discussed stretches that the patient can attempt as tolerated, as well as ice. Return precautions discussed. Patient and aide at group home understand and agree with plan. Patient also evaluated by Dr. Reather Converse who agrees with plan.  Final Clinical Impressions(s) / ED Diagnoses   Final diagnoses:  Muscle strain    New Prescriptions New  Prescriptions   IBUPROFEN (ADVIL,MOTRIN) 400 MG TABLET    Take 1 tablet (400 mg total) by mouth every 6 (six) hours as needed.     Frederica Kuster, PA-C 05/14/16 1049    Elnora Morrison, MD 05/18/16 [phone]

## 2016-05-14 NOTE — ED Triage Notes (Signed)
Pt c/o LLE pain after playing kickball 2 days ago.

## 2016-05-14 NOTE — Discharge Instructions (Signed)
Please follow-up with Dr. Barbaraann Barthel, a sports medicine doctor, if your symptoms are not improving throughout the week. Please return to the emergency department if you develop any new or worsening symptoms.

## 2016-05-25 DIAGNOSIS — R42 Dizziness and giddiness: Secondary | ICD-10-CM | POA: Diagnosis not present

## 2016-05-25 DIAGNOSIS — S0990XA Unspecified injury of head, initial encounter: Secondary | ICD-10-CM | POA: Diagnosis not present

## 2016-05-25 DIAGNOSIS — S0003XA Contusion of scalp, initial encounter: Secondary | ICD-10-CM | POA: Diagnosis not present

## 2016-05-25 DIAGNOSIS — R51 Headache: Secondary | ICD-10-CM | POA: Diagnosis not present

## 2016-05-25 DIAGNOSIS — W1839XA Other fall on same level, initial encounter: Secondary | ICD-10-CM | POA: Diagnosis not present

## 2016-05-25 DIAGNOSIS — S199XXA Unspecified injury of neck, initial encounter: Secondary | ICD-10-CM | POA: Diagnosis not present

## 2016-05-25 DIAGNOSIS — G4489 Other headache syndrome: Secondary | ICD-10-CM | POA: Diagnosis not present

## 2016-05-25 DIAGNOSIS — I6789 Other cerebrovascular disease: Secondary | ICD-10-CM | POA: Diagnosis not present

## 2016-05-25 DIAGNOSIS — R4781 Slurred speech: Secondary | ICD-10-CM | POA: Diagnosis not present

## 2016-06-04 ENCOUNTER — Other Ambulatory Visit: Payer: Self-pay | Admitting: *Deleted

## 2016-06-04 MED ORDER — MONTELUKAST SODIUM 10 MG PO TABS: ORAL_TABLET | ORAL | 0 refills | Status: DC

## 2016-06-13 DIAGNOSIS — Z3042 Encounter for surveillance of injectable contraceptive: Secondary | ICD-10-CM | POA: Diagnosis not present

## 2016-06-14 ENCOUNTER — Other Ambulatory Visit: Payer: Self-pay

## 2016-06-14 MED ORDER — BECLOMETHASONE DIPROPIONATE 80 MCG/ACT NA AERS: INHALATION_SPRAY | NASAL | 0 refills | Status: DC

## 2016-07-01 DIAGNOSIS — F251 Schizoaffective disorder, depressive type: Secondary | ICD-10-CM | POA: Diagnosis not present

## 2016-07-01 DIAGNOSIS — F70 Mild intellectual disabilities: Secondary | ICD-10-CM | POA: Diagnosis not present

## 2016-07-01 DIAGNOSIS — F609 Personality disorder, unspecified: Secondary | ICD-10-CM | POA: Diagnosis not present

## 2016-07-12 ENCOUNTER — Encounter: Payer: Self-pay | Admitting: Allergy & Immunology

## 2016-07-12 ENCOUNTER — Ambulatory Visit (INDEPENDENT_AMBULATORY_CARE_PROVIDER_SITE_OTHER): Payer: Medicare Other | Admitting: Allergy & Immunology

## 2016-07-12 VITALS — BP 114/80 | HR 84 | Temp 98.5°F | Resp 20

## 2016-07-12 DIAGNOSIS — J310 Chronic rhinitis: Secondary | ICD-10-CM

## 2016-07-12 DIAGNOSIS — H6123 Impacted cerumen, bilateral: Secondary | ICD-10-CM

## 2016-07-12 MED ORDER — AZELASTINE HCL 0.15 % NA SOLN: 2.0000 | Freq: Two times a day (BID) | NASAL | 3 refills | Status: DC

## 2016-07-12 MED ORDER — CARBAMIDE PEROXIDE 6.5 % OT SOLN: 5.0000 [drp] | Freq: Two times a day (BID) | OTIC | 3 refills | Status: DC

## 2016-07-12 MED ORDER — BECLOMETHASONE DIPROPIONATE 80 MCG/ACT NA AERS: INHALATION_SPRAY | NASAL | 5 refills | Status: DC

## 2016-07-12 MED ORDER — CETIRIZINE HCL 10 MG PO TABS: ORAL_TABLET | ORAL | 5 refills | Status: DC

## 2016-07-12 NOTE — Patient Instructions (Addendum)
1. Mixed rhinitis - Continue with Qnasl 1-2 sprays per nostril daily. - Continue with Astepro 1-2 sprays per nostril daily.  - Continue with cetirizine 10mg  daily.   2. Bilateral cerumen impaction - Try getting an over the counter ear wax removal system such as Debrox.  - You can also use a couple of drops of olive oil in each ear canal 1-2 times per week to keep the canals clear.    3. Return in about 6 months (around 01/11/2017).   Please inform us of any Emergency Department visits, hospitalizations, or changes in symptoms. Call us before going to the ED for breathing or allergy symptoms since we might be able to fit you in for a sick visit. Feel free to contact us anytime with any questions, problems, or concerns.  It was a pleasure to see you again today! Happy summer!   Websites that have reliable patient information: 1. American Academy of Asthma, Allergy, and Immunology: www.aaaai.org 2. Food Allergy Research and Education (FARE): foodallergy.org 3. Mothers of Asthmatics: http://www.asthmacommunitynetwork.org 4. American College of Allergy, Asthma, and Immunology: www.acaai.org

## 2016-07-12 NOTE — Progress Notes (Signed)
FOLLOW UP  Date of Service/Encounter:  07/12/16   Assessment:   Mixed rhinitis  Bilateral impacted cerumen   Plan/Recommendations:   1. Mixed rhinitis - Continue with Qnasl 1-2 sprays per nostril daily. - Continue with Astepro 1-2 sprays per nostril daily.  - Continue with cetirizine 10mg  daily.   2. Bilateral cerumen impaction - Try getting an over the counter ear wax removal system such as Debrox.  - You can also use a couple of drops of olive oil in each ear canal 1-2 times per week to keep the canals clear.    3. Return in about 6 months (around 01/11/2017).   Subjective:   Sonya Moran is a 41 y.o. female presenting today for follow up of  Chief Complaint  Patient presents with  . Allergic Rhinitis   . Otalgia    Sonya Moran has a history of the following: Patient Active Problem List   Diagnosis Date Noted  . Plantar fasciitis of right foot 03/28/2015  . Metatarsal deformity 03/28/2015  . Calcaneal spur 03/28/2015  . Anemia 07/23/2013  . Onychomycosis 06/24/2012    History obtained from: chart review and patient and her caregiver.  Sonya Moran was referred by Lucianne Lei, MD.     Sonya Moran is a 41 y.o. female presenting for a follow up visit. She was last seen in October 2017. At that time, she was doing well. She was changed from Allegra to cetirizine. She continued on the nasal sprays (Qnasl and Astepro) and montelukast. We did diagnose her with sinus infection and was started on two weeks of Augmentin.   Since the last visit, she has done well. She did take the Augmentin at the last visit and got better. Her caretaker today tells me that she did have amoxicillin in April for a tooth infection. Otherwise, she has not needed any antibiotics. She remains on the cetirizine once daily.   Just around Mother's Day weekend, she did fall and has needed Diclofenac for this. She did see her adopted mother 1-2 weeks before Mother's Day to see her. There have been no  surgeries since the last visit. She lives with one other individual. She has a caretaker who goes with her to all of her appointments. Otherwise, there have been no changes to her past medical history, surgical history, family history, or social history.    Review of Systems: a 14-point review of systems is pertinent for what is mentioned in HPI.  Otherwise, all other systems were negative. Constitutional: negative other than that listed in the HPI Eyes: negative other than that listed in the HPI Ears, nose, mouth, throat, and face: negative other than that listed in the HPI Respiratory: negative other than that listed in the HPI Cardiovascular: negative other than that listed in the HPI Gastrointestinal: negative other than that listed in the HPI Genitourinary: negative other than that listed in the HPI Integument: negative other than that listed in the HPI Hematologic: negative other than that listed in the HPI Musculoskeletal: negative other than that listed in the HPI Neurological: negative other than that listed in the HPI Allergy/Immunologic: negative other than that listed in the HPI    Objective:   Blood pressure 114/80, pulse 84, temperature 98.5 F (36.9 C), temperature source Oral, resp. rate 20, SpO2 97 %. There is no height or weight on file to calculate BMI.   Physical Exam:  General: Alert, interactive, in no acute distress. Some crotchety female.  Eyes: No conjunctival  injection present on the right, No conjunctival injection present on the left, PERRL bilaterally, No discharge on the right, No discharge on the left and No Horner-Trantas dots present Ears: Cerumen bilaterally, Right TM pearly gray with normal light reflex, Left TM pearly gray with normal light reflex, Right TM intact without perforation and Left TM intact without perforation.  Nose/Throat: External nose within normal limits and septum midline, turbinates edematous and pale with clear discharge,  post-pharynx moderately erythematous without cobblestoning in the posterior oropharynx. Tonsils 2+ without exudates Neck: Supple without thyromegaly. Lungs: Clear to auscultation without wheezing, rhonchi or rales. No increased work of breathing. CV: Normal S1/S2, no murmurs. Capillary refill <2 seconds.  Skin: Warm and dry, without lesions or rashes. Neuro:   Grossly intact. No focal deficits appreciated. Responsive to questions.   Diagnostic studies: none     Salvatore Marvel, MD [facility]

## 2016-08-08 DIAGNOSIS — F79 Unspecified intellectual disabilities: Secondary | ICD-10-CM | POA: Diagnosis not present

## 2016-08-08 DIAGNOSIS — D510 Vitamin B12 deficiency anemia due to intrinsic factor deficiency: Secondary | ICD-10-CM | POA: Diagnosis not present

## 2016-08-08 DIAGNOSIS — D649 Anemia, unspecified: Secondary | ICD-10-CM | POA: Diagnosis not present

## 2016-08-29 DIAGNOSIS — Z3042 Encounter for surveillance of injectable contraceptive: Secondary | ICD-10-CM | POA: Diagnosis not present

## 2016-09-02 ENCOUNTER — Telehealth: Payer: Self-pay | Admitting: Allergy

## 2016-09-02 ENCOUNTER — Other Ambulatory Visit: Payer: Self-pay

## 2016-09-02 MED ORDER — MONTELUKAST SODIUM 10 MG PO TABS: ORAL_TABLET | ORAL | 2 refills | Status: DC

## 2016-09-02 NOTE — Telephone Encounter (Signed)
Refilled her montelukast 10 mg tablets.

## 2016-09-17 ENCOUNTER — Other Ambulatory Visit: Payer: Self-pay | Admitting: *Deleted

## 2016-09-17 NOTE — Telephone Encounter (Signed)
qnasl was refilled on 06/2016 with 5 refills. Refill request too soon. Denied.

## 2016-09-20 ENCOUNTER — Other Ambulatory Visit: Payer: Self-pay

## 2016-09-20 MED ORDER — FLUTICASONE PROPIONATE 50 MCG/ACT NA SUSP: NASAL | 5 refills | Status: DC

## 2016-09-26 ENCOUNTER — Ambulatory Visit: Payer: Medicare Other | Admitting: Podiatry

## 2016-10-31 ENCOUNTER — Other Ambulatory Visit: Payer: Self-pay | Admitting: Allergy & Immunology

## 2016-11-14 DIAGNOSIS — Z3042 Encounter for surveillance of injectable contraceptive: Secondary | ICD-10-CM | POA: Diagnosis not present

## 2016-11-14 DIAGNOSIS — Z1231 Encounter for screening mammogram for malignant neoplasm of breast: Secondary | ICD-10-CM | POA: Diagnosis not present

## 2016-12-17 DIAGNOSIS — F609 Personality disorder, unspecified: Secondary | ICD-10-CM | POA: Diagnosis not present

## 2016-12-17 DIAGNOSIS — F251 Schizoaffective disorder, depressive type: Secondary | ICD-10-CM | POA: Diagnosis not present

## 2016-12-17 DIAGNOSIS — F70 Mild intellectual disabilities: Secondary | ICD-10-CM | POA: Diagnosis not present

## 2016-12-18 DIAGNOSIS — J9801 Acute bronchospasm: Secondary | ICD-10-CM | POA: Diagnosis not present

## 2016-12-18 DIAGNOSIS — D649 Anemia, unspecified: Secondary | ICD-10-CM | POA: Diagnosis not present

## 2016-12-18 DIAGNOSIS — Z6835 Body mass index (BMI) 35.0-35.9, adult: Secondary | ICD-10-CM | POA: Diagnosis not present

## 2016-12-18 DIAGNOSIS — D609 Acquired pure red cell aplasia, unspecified: Secondary | ICD-10-CM | POA: Diagnosis not present

## 2016-12-31 DIAGNOSIS — F69 Unspecified disorder of adult personality and behavior: Secondary | ICD-10-CM | POA: Diagnosis not present

## 2016-12-31 DIAGNOSIS — J9801 Acute bronchospasm: Secondary | ICD-10-CM | POA: Diagnosis not present

## 2017-01-07 DIAGNOSIS — J9811 Atelectasis: Secondary | ICD-10-CM | POA: Diagnosis not present

## 2017-01-07 DIAGNOSIS — N3000 Acute cystitis without hematuria: Secondary | ICD-10-CM | POA: Diagnosis not present

## 2017-01-07 DIAGNOSIS — K2900 Acute gastritis without bleeding: Secondary | ICD-10-CM | POA: Diagnosis not present

## 2017-01-07 DIAGNOSIS — R109 Unspecified abdominal pain: Secondary | ICD-10-CM | POA: Diagnosis not present

## 2017-01-07 DIAGNOSIS — R1084 Generalized abdominal pain: Secondary | ICD-10-CM | POA: Diagnosis not present

## 2017-01-07 DIAGNOSIS — Z79899 Other long term (current) drug therapy: Secondary | ICD-10-CM | POA: Diagnosis not present

## 2017-01-07 DIAGNOSIS — F79 Unspecified intellectual disabilities: Secondary | ICD-10-CM | POA: Diagnosis not present

## 2017-01-08 DIAGNOSIS — R1011 Right upper quadrant pain: Secondary | ICD-10-CM | POA: Diagnosis not present

## 2017-01-08 DIAGNOSIS — R109 Unspecified abdominal pain: Secondary | ICD-10-CM | POA: Diagnosis not present

## 2017-01-09 ENCOUNTER — Other Ambulatory Visit: Payer: Self-pay | Admitting: Allergy & Immunology

## 2017-01-17 ENCOUNTER — Ambulatory Visit: Payer: Medicare Other | Admitting: Allergy & Immunology

## 2017-01-17 DIAGNOSIS — F79 Unspecified intellectual disabilities: Secondary | ICD-10-CM | POA: Diagnosis not present

## 2017-01-17 DIAGNOSIS — K219 Gastro-esophageal reflux disease without esophagitis: Secondary | ICD-10-CM | POA: Diagnosis not present

## 2017-01-17 DIAGNOSIS — F2089 Other schizophrenia: Secondary | ICD-10-CM | POA: Diagnosis not present

## 2017-01-17 DIAGNOSIS — N390 Urinary tract infection, site not specified: Secondary | ICD-10-CM | POA: Diagnosis not present

## 2017-02-03 DIAGNOSIS — H60313 Diffuse otitis externa, bilateral: Secondary | ICD-10-CM | POA: Diagnosis not present

## 2017-02-03 DIAGNOSIS — F79 Unspecified intellectual disabilities: Secondary | ICD-10-CM | POA: Diagnosis not present

## 2017-02-03 DIAGNOSIS — Z3042 Encounter for surveillance of injectable contraceptive: Secondary | ICD-10-CM | POA: Diagnosis not present

## 2017-02-03 DIAGNOSIS — N390 Urinary tract infection, site not specified: Secondary | ICD-10-CM | POA: Diagnosis not present

## 2017-02-07 ENCOUNTER — Ambulatory Visit (INDEPENDENT_AMBULATORY_CARE_PROVIDER_SITE_OTHER): Payer: Medicare Other | Admitting: Allergy & Immunology

## 2017-02-07 ENCOUNTER — Encounter: Payer: Self-pay | Admitting: Allergy & Immunology

## 2017-02-07 ENCOUNTER — Other Ambulatory Visit: Payer: Self-pay

## 2017-02-07 VITALS — BP 118/68 | HR 88 | Temp 98.8°F | Resp 24

## 2017-02-07 DIAGNOSIS — J310 Chronic rhinitis: Secondary | ICD-10-CM | POA: Diagnosis not present

## 2017-02-07 DIAGNOSIS — J019 Acute sinusitis, unspecified: Secondary | ICD-10-CM | POA: Diagnosis not present

## 2017-02-07 MED ORDER — OLOPATADINE HCL 0.7 % OP SOLN: 1.0000 [drp] | OPHTHALMIC | 5 refills | Status: DC

## 2017-02-07 MED ORDER — CETIRIZINE HCL 10 MG PO TABS: 10.0000 mg | ORAL_TABLET | Freq: Every day | ORAL | 5 refills | Status: DC

## 2017-02-07 MED ORDER — DOXYCYCLINE HYCLATE 100 MG PO TABS: 100.0000 mg | ORAL_TABLET | Freq: Two times a day (BID) | ORAL | 0 refills | Status: DC

## 2017-02-07 MED ORDER — MONTELUKAST SODIUM 10 MG PO TABS: ORAL_TABLET | ORAL | 3 refills | Status: DC

## 2017-02-07 MED ORDER — AZELASTINE HCL 0.15 % NA SOLN: 2.0000 | Freq: Two times a day (BID) | NASAL | 5 refills | Status: DC

## 2017-02-07 MED ORDER — OLOPATADINE HCL 0.1 % OP SOLN: 1.0000 [drp] | Freq: Two times a day (BID) | OPHTHALMIC | 5 refills | Status: DC

## 2017-02-07 NOTE — Patient Instructions (Addendum)
1. Mixed rhinitis - Continue with cetirizine (Zyrtec) 10mg  at night. - Continue the nasal sprays as prescribed. - Continue montelukast as prescribed.  2. Sinus infection - Start doxycyline 100mg  twice daily for 10 days. - Add on prednisone 10mg  twice daily for 5 days to help with nasal inflammation. - Consider nasal rinses as well.   3. Return in about 6 months (around 08/07/2017).  Please inform us of any Emergency Department visits, hospitalizations, or changes in symptoms. Call us before going to the ED for breathing or allergy symptoms since we might be able to fit you in for a sick visit. Feel free to contact us anytime with any questions, problems, or concerns.  It was a pleasure to see you again today!   Websites that have reliable patient information: 1. American Academy of Asthma, Allergy, and Immunology: www.aaaai.org 2. Food Allergy Research and Education (FARE): foodallergy.org 3. Mothers of Asthmatics: http://www.asthmacommunitynetwork.org 4. American College of Allergy, Asthma, and Immunology: www.acaai.org

## 2017-02-07 NOTE — Progress Notes (Signed)
FOLLOW UP  Date of Service/Encounter:  02/07/17   Assessment:   Mixed rhinitis  Acute sinusitis   Plan/Recommendations:   1. Mixed rhinitis - Continue with cetirizine (Zyrtec) 10mg  at night. - Continue the nasal sprays as prescribed. - Continue montelukast as prescribed.  2. Sinus infection - Start doxycyline 100mg  twice daily for 10 days. - We are not going to use a cephalosproin or penicillin since she recently completed Keflex for a UTI.  - Add on prednisone 10mg  twice daily for 5 days to help with nasal inflammation. - Consider nasal rinses as well.   - She is already on Delsum which should be helping with her coughing.   3. Return in about 6 months (around 08/07/2017).  Subjective:   Sonya Moran is a 42 y.o. female presenting today for follow up of  Chief Complaint  Patient presents with  . Allergic Rhinitis     runny nose, ears hurt, dyspnea (nose and chest)    Sonya Moran has a history of the following: Patient Active Problem List   Diagnosis Date Noted  . Plantar fasciitis of right foot 03/28/2015  . Metatarsal deformity 03/28/2015  . Calcaneal spur 03/28/2015  . Anemia 07/23/2013  . Onychomycosis 06/24/2012    History obtained from: chart review and patient and her .  Sonya Moran's Primary Care Provider is Lucianne Lei, MD.     Sonya Moran is a 42 y.o. female presenting for a follow up visit. I last saw her in June 2018 for treatment of her mixed rhinitis. We continue her on Qnasal 1-2 sprays per nostril daily, Astepro 1-2 sprays per nostril daily, and cetirizine 10mg  daily. We also discussed maintenance of her ear canals to avoid cerumen impaction. At the last visit, she had recently experienced a fall and was put on diclofenac for pain.   Since the last visit, she has mostly done well. She has had a runny nose for one week without fevers. She has continued on her nose sprays without relief. She also endorses some recent dizziness and ear fullness. She was  given a cream for her ears. She does need refills on her nose sprays.   She remains on the Qnasl as well as the Astepro and the cetirizine. She is on the Singulair 10mg  daily. She was recently treated for a UTI around 4-5 weeks ago in December 2018. She has not been treated for sinusitis since two visits ago. Vaccinations are up to date.  Otherwise, there have been no changes to her past medical history, surgical history, family history, or social history. She has one roommate who she gets along with well. She has been with her roommate for 8 years. She has been under the care of her current caretaker since 2010.     Review of Systems: a 14-point review of systems is pertinent for what is mentioned in HPI.  Otherwise, all other systems were negative. Constitutional: negative other than that listed in the HPI Eyes: negative other than that listed in the HPI Ears, nose, mouth, throat, and face: negative other than that listed in the HPI Respiratory: negative other than that listed in the HPI Cardiovascular: negative other than that listed in the HPI Gastrointestinal: negative other than that listed in the HPI Genitourinary: negative other than that listed in the HPI Integument: negative other than that listed in the HPI Hematologic: negative other than that listed in the HPI Musculoskeletal: negative other than that listed in the HPI Neurological: negative other  than that listed in the HPI Allergy/Immunologic: negative other than that listed in the HPI    Objective:   Blood pressure 118/68, pulse 88, temperature 98.8 F (37.1 C), temperature source Tympanic, resp. rate (!) 24, SpO2 97 %. There is no height or weight on file to calculate BMI.   Physical Exam:  General: Alert, interactive, in no acute distress. Very pleasant as usual.  Eyes: No conjunctival injection bilaterally, no discharge on the right, no discharge on the left and no Horner-Trantas dots present. PERRL bilaterally.  EOMI without pain. No photophobia.  Ears: Right TM pearly gray with normal light reflex, Left TM pearly gray with normal light reflex, Right TM intact without perforation and Left TM intact without perforation.  Nose/Throat: External nose within normal limits, nasal crease present and septum midline. Turbinates edematous and pale with thick discharge. Posterior oropharynx erythematous without cobblestoning in the posterior oropharynx. Tonsils 2+ without exudates.  Tongue without thrush. Adenopathy: no enlarged lymph nodes appreciated in the anterior cervical, occipital, axillary, epitrochlear, inguinal, or popliteal regions. Lungs: Clear to auscultation without wheezing, rhonchi or rales. No increased work of breathing. CV: Normal S1/S2. No murmurs. Capillary refill <2 seconds.  Skin: Warm and dry, without lesions or rashes. Neuro:   Grossly intact. No focal deficits appreciated. Responsive to questions.  Diagnostic studies: none    Salvatore Marvel, MD [facility]

## 2017-02-08 ENCOUNTER — Other Ambulatory Visit: Payer: Self-pay | Admitting: Allergy & Immunology

## 2017-02-10 ENCOUNTER — Other Ambulatory Visit: Payer: Self-pay | Admitting: Allergy & Immunology

## 2017-02-12 ENCOUNTER — Telehealth: Payer: Self-pay

## 2017-02-12 ENCOUNTER — Other Ambulatory Visit: Payer: Self-pay | Admitting: Allergy & Immunology

## 2017-02-12 NOTE — Telephone Encounter (Signed)
The olopatadine 0.1% is not covered by patient's insurance. Alternatives are Pazeo, Bepreve, azelastine. Please advise and thank you.

## 2017-02-12 NOTE — Telephone Encounter (Signed)
Pazeo is fine. 1 drop each eye as needed daily for itchy/watery/red eyes

## 2017-02-12 NOTE — Telephone Encounter (Signed)
Prescription was sent in 02-07-17

## 2017-02-12 NOTE — Addendum Note (Signed)
Addended by: Lucrezia Starch I on: 02/12/2017 09:25 AM   Modules accepted: Orders

## 2017-02-14 DIAGNOSIS — H2513 Age-related nuclear cataract, bilateral: Secondary | ICD-10-CM | POA: Diagnosis not present

## 2017-02-21 DIAGNOSIS — H60313 Diffuse otitis externa, bilateral: Secondary | ICD-10-CM | POA: Diagnosis not present

## 2017-02-21 DIAGNOSIS — F5101 Primary insomnia: Secondary | ICD-10-CM | POA: Diagnosis not present

## 2017-02-21 DIAGNOSIS — E559 Vitamin D deficiency, unspecified: Secondary | ICD-10-CM | POA: Diagnosis not present

## 2017-03-09 ENCOUNTER — Other Ambulatory Visit: Payer: Self-pay | Admitting: Allergy & Immunology

## 2017-03-12 ENCOUNTER — Other Ambulatory Visit: Payer: Self-pay | Admitting: Allergy & Immunology

## 2017-03-12 NOTE — Telephone Encounter (Addendum)
fluoxetine was NOT supposed to be filled by Dr. Ernst Bowler. I am unsure of how this occurred. I have informed him of the error. I have called and cancelled the prescription and any further refills.

## 2017-03-24 ENCOUNTER — Other Ambulatory Visit: Payer: Self-pay | Admitting: Allergy

## 2017-03-24 MED ORDER — FLUTICASONE PROPIONATE 50 MCG/ACT NA SUSP: NASAL | 5 refills | Status: DC

## 2017-03-25 DIAGNOSIS — E782 Mixed hyperlipidemia: Secondary | ICD-10-CM | POA: Diagnosis not present

## 2017-03-25 DIAGNOSIS — J399 Disease of upper respiratory tract, unspecified: Secondary | ICD-10-CM | POA: Diagnosis not present

## 2017-03-25 DIAGNOSIS — K219 Gastro-esophageal reflux disease without esophagitis: Secondary | ICD-10-CM | POA: Diagnosis not present

## 2017-04-09 DIAGNOSIS — K219 Gastro-esophageal reflux disease without esophagitis: Secondary | ICD-10-CM | POA: Diagnosis not present

## 2017-04-09 DIAGNOSIS — E669 Obesity, unspecified: Secondary | ICD-10-CM | POA: Diagnosis not present

## 2017-04-09 DIAGNOSIS — K580 Irritable bowel syndrome with diarrhea: Secondary | ICD-10-CM | POA: Diagnosis not present

## 2017-04-09 DIAGNOSIS — F79 Unspecified intellectual disabilities: Secondary | ICD-10-CM | POA: Diagnosis not present

## 2017-04-11 ENCOUNTER — Other Ambulatory Visit: Payer: Self-pay | Admitting: Family Medicine

## 2017-04-11 ENCOUNTER — Ambulatory Visit
Admission: RE | Admit: 2017-04-11 | Discharge: 2017-04-11 | Disposition: A | Discharge status: Home / Self Care | Payer: Medicare Other | Source: Ambulatory Visit | Attending: Family Medicine | Admitting: Family Medicine

## 2017-04-11 DIAGNOSIS — F69 Unspecified disorder of adult personality and behavior: Secondary | ICD-10-CM | POA: Diagnosis not present

## 2017-04-11 DIAGNOSIS — K219 Gastro-esophageal reflux disease without esophagitis: Secondary | ICD-10-CM | POA: Diagnosis not present

## 2017-04-11 DIAGNOSIS — K5900 Constipation, unspecified: Secondary | ICD-10-CM

## 2017-04-11 MED ORDER — IOPAMIDOL (ISOVUE-300) INJECTION 61%
100.0000 mL | Freq: Once | INTRAVENOUS | Status: AC | PRN
  Administered 2017-04-11: 100 mL via INTRAVENOUS

## 2017-04-15 ENCOUNTER — Other Ambulatory Visit: Payer: Self-pay | Admitting: Family Medicine

## 2017-04-16 ENCOUNTER — Other Ambulatory Visit: Payer: Self-pay | Admitting: Family Medicine

## 2017-04-16 ENCOUNTER — Ambulatory Visit
Admission: RE | Admit: 2017-04-16 | Discharge: 2017-04-16 | Disposition: A | Discharge status: Home / Self Care | Payer: Medicare Other | Source: Ambulatory Visit | Attending: Family Medicine | Admitting: Family Medicine

## 2017-04-16 DIAGNOSIS — K219 Gastro-esophageal reflux disease without esophagitis: Secondary | ICD-10-CM

## 2017-04-16 DIAGNOSIS — E669 Obesity, unspecified: Secondary | ICD-10-CM

## 2017-04-16 DIAGNOSIS — K580 Irritable bowel syndrome with diarrhea: Secondary | ICD-10-CM

## 2017-04-16 DIAGNOSIS — R05 Cough: Secondary | ICD-10-CM | POA: Diagnosis not present

## 2017-04-22 DIAGNOSIS — F70 Mild intellectual disabilities: Secondary | ICD-10-CM | POA: Diagnosis not present

## 2017-04-22 DIAGNOSIS — F609 Personality disorder, unspecified: Secondary | ICD-10-CM | POA: Diagnosis not present

## 2017-04-22 DIAGNOSIS — F251 Schizoaffective disorder, depressive type: Secondary | ICD-10-CM | POA: Diagnosis not present

## 2017-04-25 DIAGNOSIS — Z3042 Encounter for surveillance of injectable contraceptive: Secondary | ICD-10-CM | POA: Diagnosis not present

## 2017-05-01 DIAGNOSIS — K5900 Constipation, unspecified: Secondary | ICD-10-CM | POA: Diagnosis not present

## 2017-07-07 DIAGNOSIS — J682 Upper respiratory inflammation due to chemicals, gases, fumes and vapors, not elsewhere classified: Secondary | ICD-10-CM | POA: Diagnosis not present

## 2017-07-07 DIAGNOSIS — F79 Unspecified intellectual disabilities: Secondary | ICD-10-CM | POA: Diagnosis not present

## 2017-07-15 DIAGNOSIS — Z3042 Encounter for surveillance of injectable contraceptive: Secondary | ICD-10-CM | POA: Diagnosis not present

## 2017-07-30 DIAGNOSIS — J682 Upper respiratory inflammation due to chemicals, gases, fumes and vapors, not elsewhere classified: Secondary | ICD-10-CM | POA: Diagnosis not present

## 2017-07-30 DIAGNOSIS — F79 Unspecified intellectual disabilities: Secondary | ICD-10-CM | POA: Diagnosis not present

## 2017-08-01 ENCOUNTER — Telehealth: Payer: Self-pay | Admitting: Family Medicine

## 2017-08-01 ENCOUNTER — Encounter: Payer: Self-pay | Admitting: Family Medicine

## 2017-08-01 ENCOUNTER — Ambulatory Visit (INDEPENDENT_AMBULATORY_CARE_PROVIDER_SITE_OTHER): Payer: Medicare Other | Admitting: Family Medicine

## 2017-08-01 VITALS — BP 104/76 | HR 82 | Temp 98.1°F | Resp 20

## 2017-08-01 DIAGNOSIS — H6983 Other specified disorders of Eustachian tube, bilateral: Secondary | ICD-10-CM | POA: Diagnosis not present

## 2017-08-01 DIAGNOSIS — J310 Chronic rhinitis: Secondary | ICD-10-CM

## 2017-08-01 DIAGNOSIS — H9203 Otalgia, bilateral: Secondary | ICD-10-CM | POA: Insufficient documentation

## 2017-08-01 MED ORDER — FEXOFENADINE HCL 180 MG PO TABS: 180.0000 mg | ORAL_TABLET | Freq: Every day | ORAL | 0 refills | Status: DC

## 2017-08-01 NOTE — Progress Notes (Addendum)
[place] [address] Dept: [phone]  FOLLOW UP NOTE  Patient ID: Sonya Moran, female    DOB: 1975/12/12  Age: 42 y.o. MRN: [id number] Date of Office Visit: 08/01/2017  Assessment  Chief Complaint: Otalgia  HPI Sonya Moran is a 42 year old female who presents to the clinic for a follow up visit. She was last seen in this clinic on 02/07/2017 by Dr. Ernst Bowler for evaluation of acute sinusitis and mixed rhinitis. At that time, she required an antibiotic and prednisone for resolution of sinusitis. Mixed rhinitis was reported as well controlled with cetirizine and montelukast.   At today's visit, she reports bilateral ear pain that began about 2 weeks ago. She reports that about 1 month ago she began to experience symptoms including increased nasal drainage, post nasal drainage, and sore throat. At that time, she began Allegra D in addition to her current medical regimen. She reports relief of nasal drainage and sore throat, however, she is reporting bilateral ear pain and popping that began about 2 weeks ago. She denies fever and sick contacts. She is currently taking cetirizine once a day, montelukast 10 mg once a day, Allegra D once a day, and using Astelin nasal spray 2 sprays twice a day.   Allergic rhinitis is reported as well controlled. She reports that her nose and mouth are very dry over the last 2 weeks.   Her current medications are listed in the chart.   Drug Allergies:  Allergies  Allergen Reactions  . Grifulvin V [Griseofulvin] Anaphylaxis and Other (See Comments)    Arm swelling    Physical Exam: BP 104/76 (BP Location: Right Arm, Patient Position: Sitting, Cuff Size: Normal)   Pulse 82   Temp 98.1 F (36.7 C) (Oral)   Resp 20   SpO2 96%    Physical Exam  Constitutional: She is oriented to person, place, and time. She appears well-developed and well-nourished.  HENT:  Head: Normocephalic.  Right Ear: External ear normal.  Left Ear:  External ear normal.  Nose: Nose normal.  Mouth/Throat: Oropharynx is clear and moist.  Bilateral nares erythematous and dry with no nasal drainage noted. Bilateral ears with TM's normal. Bilateral cerumen accumulation noted. Pharynx dry. Tongue dry. Eyes normal.   Eyes: Conjunctivae are normal.  Neck: Normal range of motion. Neck supple.  Cardiovascular: Normal rate, regular rhythm and normal heart sounds.  No murmur noted  Pulmonary/Chest: Effort normal and breath sounds normal.  Lungs clear to auscultation  Musculoskeletal: Normal range of motion.  Neurological: She is alert and oriented to person, place, and time.  Skin: Skin is warm and dry.  Psychiatric: She has a normal mood and affect. Her behavior is normal. Judgment and thought content normal.  Vitals reviewed.     Assessment and Plan: 1. Mixed rhinitis   2. Eustachian tube dysfunction, bilateral     Meds ordered this encounter  Medications  . fexofenadine (ALLEGRA) 180 MG tablet    Sig: Take 1 tablet (180 mg total) by mouth daily.    Dispense:  30 tablet    Refill:  0    Patient Instructions  1. Mixed rhinitis - Begin Allegra 180 mg once a day - Continue the nasal Flonase as prescribed and azelastine nasal. - Continue montelukast as prescribed.  2. Bilateral ear pain - Tympanic membranes appear normal today - For now, increase Flonase to twice a day. Proper administration technique has been discussed and demonstrated. - Begin saline nasal  rinses 1-2 times every day prior to Flonase. - May add pseudoephedrine as needed. Pseudoephedrine should only be used as needed on a short term basis. - If symptoms persist or progress, will provided low dose prednisone.  Call me if this treatment plan is not working well for you  3. Follow up in 1 month or sooner if needed   Return in about 1 month (around 08/29/2017).    Thank you for the opportunity to care for this patient.  Please do not hesitate to contact me with  questions.  [name], FNP [facility]  _________________________________________________  I have provided oversight concerning Sonya Moran's evaluation and treatment of this patient's health issues addressed during today's encounter.  I agree with the assessment and therapeutic plan as outlined in the note.   Signed,   R Edgar Frisk, MD

## 2017-08-01 NOTE — Patient Instructions (Addendum)
1. Mixed rhinitis - Stop Zyrtec. Stop Allegra D. Begin Allegra 180 mg once a day - Continue the nasal Flonase as prescribed. - Continue montelukast as prescribed.  2. Bilateral ear pain - Your ears look normal today - Continue Flonase as prescribed. Point the applicator outward toward the ear. If the applicator is in the right nostril point it out toward the right ear. - Begin saline nasal rinses 1-2 times every day - If the pain gets any worse or does not improve call the office and we will call in a prescription  Call me if this treatment plan is not working well for you  3. Follow up in 1 month or sooner if needed  Please inform us of any Emergency Department visits, hospitalizations, or changes in symptoms. Call us before going to the ED for breathing or allergy symptoms since we might be able to fit you in for a sick visit. Feel free to contact us anytime with any questions, problems, or concerns.   Websites that have reliable patient information: 1. American Academy of Asthma, Allergy, and Immunology: www.aaaai.org 2. Food Allergy Research and Education (FARE): foodallergy.org 3. Mothers of Asthmatics: http://www.asthmacommunitynetwork.org 4. American College of Allergy, Asthma, and Immunology: www.acaai.org

## 2017-08-01 NOTE — Telephone Encounter (Signed)
I called Shawntia's caregiver and told her to call the office if Phaedra's ear pain worsens or does not improve with the treatment plan provided today and using the new nasal spray techniques. We can call in a medication to her pharmacy at that point. She verbalized agreement and understanding of this plan.

## 2017-08-06 ENCOUNTER — Other Ambulatory Visit: Payer: Self-pay | Admitting: Allergy & Immunology

## 2017-08-29 DIAGNOSIS — Z1151 Encounter for screening for human papillomavirus (HPV): Secondary | ICD-10-CM | POA: Diagnosis not present

## 2017-08-29 DIAGNOSIS — Z124 Encounter for screening for malignant neoplasm of cervix: Secondary | ICD-10-CM | POA: Diagnosis not present

## 2017-08-29 DIAGNOSIS — Z01411 Encounter for gynecological examination (general) (routine) with abnormal findings: Secondary | ICD-10-CM | POA: Diagnosis not present

## 2017-09-20 DIAGNOSIS — T7421XA Adult sexual abuse, confirmed, initial encounter: Secondary | ICD-10-CM | POA: Diagnosis not present

## 2017-09-20 DIAGNOSIS — Y999 Unspecified external cause status: Secondary | ICD-10-CM | POA: Diagnosis not present

## 2017-09-20 DIAGNOSIS — T7621XA Adult sexual abuse, suspected, initial encounter: Secondary | ICD-10-CM | POA: Diagnosis not present

## 2017-09-20 DIAGNOSIS — X58XXXA Exposure to other specified factors, initial encounter: Secondary | ICD-10-CM | POA: Diagnosis not present

## 2017-09-21 ENCOUNTER — Encounter (HOSPITAL_BASED_OUTPATIENT_CLINIC_OR_DEPARTMENT_OTHER): Payer: Self-pay | Admitting: *Deleted

## 2017-09-21 ENCOUNTER — Other Ambulatory Visit: Payer: Self-pay

## 2017-09-21 ENCOUNTER — Ambulatory Visit (HOSPITAL_COMMUNITY)
Admission: EM | Admit: 2017-09-21 | Discharge: 2017-09-21 | Disposition: A | Discharge status: Home / Self Care | Payer: No Typology Code available for payment source | Source: Ambulatory Visit | Attending: Emergency Medicine | Admitting: Emergency Medicine

## 2017-09-21 ENCOUNTER — Emergency Department (HOSPITAL_BASED_OUTPATIENT_CLINIC_OR_DEPARTMENT_OTHER)
Admission: EM | Admit: 2017-09-21 | Discharge: 2017-09-21 | Disposition: A | Discharge status: Home / Self Care | Payer: Medicare Other | Attending: Emergency Medicine | Admitting: Emergency Medicine

## 2017-09-21 DIAGNOSIS — Z0441 Encounter for examination and observation following alleged adult rape: Secondary | ICD-10-CM | POA: Insufficient documentation

## 2017-09-21 DIAGNOSIS — T7621XA Adult sexual abuse, suspected, initial encounter: Secondary | ICD-10-CM | POA: Insufficient documentation

## 2017-09-21 DIAGNOSIS — Z79899 Other long term (current) drug therapy: Secondary | ICD-10-CM | POA: Diagnosis not present

## 2017-09-21 DIAGNOSIS — F79 Unspecified intellectual disabilities: Secondary | ICD-10-CM | POA: Diagnosis not present

## 2017-09-21 DIAGNOSIS — T7421XA Adult sexual abuse, confirmed, initial encounter: Secondary | ICD-10-CM

## 2017-09-21 LAB — URINALYSIS, MICROSCOPIC (REFLEX)

## 2017-09-21 LAB — URINALYSIS, ROUTINE W REFLEX MICROSCOPIC
Bilirubin Urine: NEGATIVE
Glucose, UA: NEGATIVE mg/dL
Ketones, ur: NEGATIVE mg/dL
Nitrite: NEGATIVE
Protein, ur: NEGATIVE mg/dL
Specific Gravity, Urine: <1.005 — ABNORMAL LOW (ref 1.005–1.030)
pH: 6 (ref 5.0–8.0)

## 2017-09-21 LAB — PREGNANCY, URINE: Preg Test, Ur: NEGATIVE
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
## 2017-09-21 NOTE — ED Notes (Signed)
Spoke with SANE RN Lenna Sciara.

## 2017-09-21 NOTE — ED Notes (Signed)
Pt and caregiver notified urine sample is needed. Pt just had recent trip to bathroom. Will try again later.

## 2017-09-21 NOTE — ED Notes (Signed)
SANE RN at bedside.

## 2017-09-22 ENCOUNTER — Encounter (HOSPITAL_COMMUNITY): Payer: Self-pay | Admitting: *Deleted

## 2017-09-22 ENCOUNTER — Emergency Department (HOSPITAL_COMMUNITY)
Admission: EM | Admit: 2017-09-22 | Discharge: 2017-09-22 | Disposition: A | Discharge status: Home / Self Care | Payer: Medicare Other | Attending: Emergency Medicine | Admitting: Emergency Medicine

## 2017-09-22 ENCOUNTER — Other Ambulatory Visit: Payer: Self-pay

## 2017-09-22 DIAGNOSIS — F609 Personality disorder, unspecified: Secondary | ICD-10-CM | POA: Diagnosis not present

## 2017-09-22 DIAGNOSIS — F4310 Post-traumatic stress disorder, unspecified: Secondary | ICD-10-CM | POA: Diagnosis not present

## 2017-09-22 DIAGNOSIS — F70 Mild intellectual disabilities: Secondary | ICD-10-CM | POA: Diagnosis not present

## 2017-09-22 DIAGNOSIS — Z0441 Encounter for examination and observation following alleged adult rape: Secondary | ICD-10-CM | POA: Insufficient documentation

## 2017-09-22 DIAGNOSIS — R7989 Other specified abnormal findings of blood chemistry: Secondary | ICD-10-CM | POA: Insufficient documentation

## 2017-09-22 DIAGNOSIS — R945 Abnormal results of liver function studies: Secondary | ICD-10-CM

## 2017-09-22 DIAGNOSIS — Z79899 Other long term (current) drug therapy: Secondary | ICD-10-CM | POA: Diagnosis not present

## 2017-09-22 DIAGNOSIS — T7421XA Adult sexual abuse, confirmed, initial encounter: Secondary | ICD-10-CM

## 2017-09-22 DIAGNOSIS — F251 Schizoaffective disorder, depressive type: Secondary | ICD-10-CM | POA: Diagnosis not present

## 2017-09-22 LAB — RAPID HIV SCREEN (HIV 1/2 AB+AG)
HIV 1/2 Antibodies: NONREACTIVE
HIV-1 P24 Antigen - HIV24: NONREACTIVE

## 2017-09-22 LAB — COMPREHENSIVE METABOLIC PANEL
ALT: 29 U/L (ref 0–44)
AST: 51 U/L — ABNORMAL HIGH (ref 15–41)
Albumin: 3.6 g/dL (ref 3.5–5.0)
Alkaline Phosphatase: 56 U/L (ref 38–126)
Anion gap: 7 (ref 5–15)
BUN: 9 mg/dL (ref 6–20)
CO2: 23 mmol/L (ref 22–32)
Calcium: 9.9 mg/dL (ref 8.9–10.3)
Chloride: 107 mmol/L (ref 98–111)
Creatinine, Ser: 1.01 mg/dL — ABNORMAL HIGH (ref 0.44–1.00)
GFR calc Af Amer: >60 mL/min (ref >60)
GFR calc non Af Amer: >60 mL/min (ref >60)
Glucose, Bld: 93 mg/dL (ref 70–99)
Potassium: 4.4 mmol/L (ref 3.5–5.1)
Sodium: 137 mmol/L (ref 135–145)
Total Bilirubin: 0.7 mg/dL (ref 0.3–1.2)
Total Protein: 7.3 g/dL (ref 6.5–8.1)

## 2017-09-22 MED ORDER — LIDOCAINE HCL (PF) 1 % IJ SOLN: 0.9000 mL | Freq: Once | INTRAMUSCULAR | Status: DC

## 2017-09-22 MED ORDER — METRONIDAZOLE 500 MG PO TABS: 2000.0000 mg | ORAL_TABLET | Freq: Once | ORAL | Status: DC

## 2017-09-22 MED ORDER — ELVITEG-COBIC-EMTRICIT-TENOFAF 150-150-200-10 MG PREPACK
5.0000 | ORAL_TABLET | Freq: Once | ORAL | Status: AC
  Administered 2017-09-22: 5 via ORAL
  Filled 2017-09-22: qty 5

## 2017-09-22 MED ORDER — AZITHROMYCIN 250 MG PO TABS: 1000.0000 mg | ORAL_TABLET | Freq: Once | ORAL | Status: DC

## 2017-09-22 MED ORDER — ULIPRISTAL ACETATE 30 MG PO TABS
30.0000 mg | ORAL_TABLET | Freq: Once | ORAL | Status: DC
  Filled 2017-09-22: qty 1

## 2017-09-22 MED ORDER — CEFTRIAXONE SODIUM 250 MG IJ SOLR: 250.0000 mg | Freq: Once | INTRAMUSCULAR | Status: DC

## 2017-09-22 MED ORDER — ELVITEG-COBIC-EMTRICIT-TENOFAF 150-150-200-10 MG PO TABS: 1.0000 | ORAL_TABLET | Freq: Every day | ORAL | 0 refills | Status: DC

## 2017-09-22 NOTE — SANE Note (Signed)
Follow-up Phone Call  Patient gives verbal consent for a FNE/SANE follow-up phone call in 48-72 hours: Yes Patient's telephone number: [phone] Patient gives verbal consent to leave voicemail at the phone number listed above: Yes DO NOT CALL between the hours of: call anytime  This is the patient's caregiver and owner of her home, [name].

## 2017-09-22 NOTE — Consult Note (Signed)
Patient given graham crackers and water at her request. Awaiting lab results.

## 2017-09-22 NOTE — Consult Note (Signed)
The SANE/FNE (Forensic Nurse Examiner) consult has been completed. The primary or charge RN and physician have been notified. Please contact the SANE/FNE nurse on call (listed in Amion) with any further concerns.  
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
## 2017-09-22 NOTE — ED Provider Notes (Signed)
Patient placed in Quick Look pathway, seen and evaluated   Chief Complaint: medications  HPI:   Patient had sane work up yesterday at [facility]- instructed to come for meds  ROS: NO fevers (one)  Physical Exam:   Gen: No distress  Neuro: Awake and Alert  Skin: Warm    Focused Exam: RRR, ctab   Initiation of care has begun. The patient has been counseled on the process, plan, and necessity for staying for the completion/evaluation, and the remainder of the medical screening examination    Margarita Mail, PA-C 09/22/17 [place], Wonda Olds, MD 09/22/17 1836

## 2017-09-22 NOTE — ED Notes (Signed)
SANE nurse at bedside.

## 2017-09-22 NOTE — Consult Note (Signed)
Spoke with Agricultural consultant. She will attempt to expedite provider assignment process.

## 2017-09-22 NOTE — Consult Note (Signed)
09/22/17 @ [facility] prescription faxed to [facility] and emailed to pharmacy personnel.

## 2017-09-22 NOTE — Consult Note (Signed)
[name] (patients' caregiver) in room with patient and aware of plan of care. She states that Ms. Arruda will follow up with Dr. Benjie Karvonen in a couple of weeks. Encouraged to let the FNE department know if a follow up with [facility] is necessary. She states she will not have the new lower dose of Seraquil until tomorrow and would like to wait until the morning to start Cassia. She is aware we are awaiting lab results before medication can be given.

## 2017-09-22 NOTE — ED Triage Notes (Signed)
Pt and caregiver reports only being here to get meds from SANE nurse. Had full exam done on 9/8. Paging sane nurse for further info.

## 2017-09-22 NOTE — Consult Note (Signed)
Discharge instructions discussed with Sonya Moran. She and [name] deny questions or concerns.
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
## 2017-09-22 NOTE — ED Notes (Signed)
Spoke with sane RN and they report pt will need medical clearance prior to speaking with patient about meds.

## 2017-09-22 NOTE — Consult Note (Signed)
Called for consult r/t medications not given during SANE exam yesterday at [facility]. Asked for provider to medically clear or assume care in order to have provider for medication orders. ED RN states they will call back when provider assigned.

## 2017-09-22 NOTE — SANE Note (Signed)
The patient's Guardian is  Gordy Savers [phone] (during the patient's visit I spoke with Mr. Golda Acre from 04:40am to 05:00am. He consented for the evidence collection process and advised to not administer any medication.   The patient has an established therapist: Benjamine Mola "[place] [place]" [place], [place]  (practices out of her home)  The patient's primary care physician is:  Dr. Jamelle Haring [facility] [place]  The patient lives at [place], [address]  Owned by [name]  Office phone [phone] Personal phone [phone]  Kit # [phone] was collected.
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
## 2017-09-22 NOTE — Discharge Instructions (Addendum)
Follow up with OB/GYN for STI testing in about 14 days. Call Forensic Nursing if unable to get in with primary OB/GYN and a referral to [facility] is needed- [phone] or [phone]. Start Genvoya as soon as possible. Must be started by 17:00 on 09/23/17 to be effective. Take one pill by mouth once a day (at the same time every day) for the next 30 days. [place] will mail the remaining 25 tablets to the address you provided.   Cobicistat; Elvitegravir; Emtricitabine; Tenofovir Alafenamide oral tablets   What is this medicine? COBICISTAT; ELVITEGRAVIR; EMTRICITABINE; TENOFOVIR ALAFENAMIDE (koe BIS i stat; el vye TEG ra veer; em tri SIT uh bean; te NOE fo veer) is three antiretroviral medicines and a medication booster in one tablet. It is used to treat HIV. This medicine is not a cure for HIV. It will not stop the spread of HIV to others. This medicine may be used for other purposes; ask your health care provider or pharmacist if you have questions. COMMON BRAND NAME(S): Genvoya  What should I tell my health care provider before I take this medicine? They need to know if you have any of these conditions: -kidney disease -liver disease -an unusual or allergic reaction to cobicistat, elvitegravir, emtricitabine, tenofovir, other medicines, foods, dyes, or preservatives -pregnant or trying to get pregnant -breast-feeding  How should I use this medicine? Take this medicine by mouth with a glass of water. Follow the directions on the prescription label. Take this medicine with food. Take your medicine at regular intervals. Do not take your medicine more often than directed. For your anti-HIV therapy to work as well as possible, take each dose exactly as prescribed. Do not skip doses or stop your medicine even if you feel better. Skipping doses may make the HIV virus resistant to this medicine and other medicines. Do not stop taking except on your doctor's  advice. Talk to your pediatrician regarding the use of this medicine in children. While this drug may be prescribed for selected conditions, precautions do apply. Overdosage: If you think you have taken too much of this medicine contact a poison control center or emergency room at once. NOTE: This medicine is only for you. Do not share this medicine with others.  What if I miss a dose? If you miss a dose, take it as soon as you can. If it is almost time for your next dose, take only that dose. Do not take double or extra doses.  What may interact with this medicine? Do not take this medicine with any of the following medications: -adefovir -alfuzosin -certain medicines for seizures like carbamazepine, phenobarbital, phenytoin -cisapride -lumacaftor; ivacaftor -lurasidone -medicines for cholesterol like lovastatin, simvastatin -medicines for headaches like dihydroergotamine, ergotamine, methylergonovine -midazolam -other antiviral medicines for HIV or AIDS -pimozide -rifampin -sildenafil -St. John's wort -triazolam This medicine may also interact with the following medications: -antacids -atorvastatin -bosentan -buprenorphine; naloxone -certain antibiotics like clarithromycin, telithromycin, rifabutin, rifapentine -certain medications for anxiety or sleep like buspirone, clorazepate, diazepam, estazolam, flurazepam, zolpidem -certain medicines for blood pressure or heart disease like amlodipine, diltiazem, felodipine, metoprolol, nicardipine, nifedipine, timolol, verapamil -certain medicines for depression, anxiety, or psychiatric disturbances -certain medicines for erectile dysfunction like avanafil, sildenafil, tadalafil, vardenafil -certain medicines for fungal infection like itraconazole, ketoconazole, voriconazole -colchicine -cyclosporine -dexamethasone -female hormones, like estrogens and progestins and birth control pills -fluticasone -medicines for infection like  acyclovir, cidofovir, valacyclovir, ganciclovir, valganciclovir -medicines for irregular heart beat like amiodarone, bepridil, digoxin, disopyramide,  dofetilide, flecainide, lidocaine, mexiletine, propafenone, quinidine -metformin -oxcarbazepine -phenothiazines like perphenazine, risperidone, thioridazine -salmeterol -sirolimus -tacrolimus -warfarin This list may not describe all possible interactions. Give your health care provider a list of all the medicines, herbs, non-prescription drugs, or dietary supplements you use. Also tell them if you smoke, drink alcohol, or use illegal drugs. Some items may interact with your medicine.  What should I watch for while using this medicine? Visit your doctor or health care professional for regular check ups. Discuss any new symptoms with your doctor. You will need to have important blood work done while on this medicine. HIV is spread to others through sexual or blood contact. Talk to your doctor about how to stop the spread of HIV. If you have hepatitis B, talk to your doctor if you plan to stop this medicine. The symptoms of hepatitis B may get worse if you stop this medicine. Birth control pills may not work properly while you are taking this medicine. Talk to your doctor about using an extra method of birth control. Women who can still have children must use a reliable form of barrier contraception, like a condom.  What side effects may I notice from receiving this medicine? Side effects that you should report to your doctor or health care professional as soon as possible: -allergic reactions like skin rash, itching or hives, swelling of the face, lips, or tongue -breathing problems -fast, irregular heartbeat -muscle pain or weakness -signs and symptoms of kidney injury like trouble passing urine or change in the amount of urine -signs and symptoms of liver injury like dark yellow or brown urine; general ill feeling or flu-like symptoms;  light-colored stools; loss of appetite; right upper belly pain; unusually weak or tired; yellowing of the eyes or skin Side effects that usually do not require medical attention (report to your doctor or health care professional if they continue or are bothersome): -diarrhea -headache -nausea -tiredness This list may not describe all possible side effects. Call your doctor for medical advice about side effects. You may report side effects to FDA at 1-800-FDA-1088.  Where should I keep my medicine? Keep out of the reach of children. Store at room temperature below 30 degrees C (86 degrees F). Throw away any unused medicine after the expiration date. NOTE: This sheet is a summary. It may not cover all possible information. If you have questions about this medicine, talk to your doctor, pharmacist, or health care provider.  2018 Elsevier/Gold Standard (2015-10-16 12:54:04)

## 2017-09-23 LAB — HEPATITIS B SURFACE ANTIGEN: Hepatitis B Surface Ag: NEGATIVE

## 2017-09-23 LAB — HEPATITIS C ANTIBODY: HCV Ab: <0.1 s/co ratio (ref 0.0–0.9)

## 2017-09-23 LAB — RPR: RPR Ser Ql: NONREACTIVE

## 2017-09-23 MED FILL — GENVOYA TABLET: 150-150-200 | 25 days supply | Qty: 25 | Fill #0

## 2017-10-06 DIAGNOSIS — Z3042 Encounter for surveillance of injectable contraceptive: Secondary | ICD-10-CM | POA: Diagnosis not present

## 2017-10-10 ENCOUNTER — Other Ambulatory Visit (HOSPITAL_COMMUNITY)
Admission: RE | Admit: 2017-10-10 | Discharge: 2017-10-10 | Disposition: A | Discharge status: Home / Self Care | Payer: Medicare Other | Source: Ambulatory Visit | Attending: Family Medicine | Admitting: Family Medicine

## 2017-10-10 ENCOUNTER — Other Ambulatory Visit: Payer: Self-pay | Admitting: Family Medicine

## 2017-10-10 DIAGNOSIS — Z113 Encounter for screening for infections with a predominantly sexual mode of transmission: Secondary | ICD-10-CM | POA: Diagnosis not present

## 2017-10-10 DIAGNOSIS — Z6833 Body mass index (BMI) 33.0-33.9, adult: Secondary | ICD-10-CM | POA: Diagnosis not present

## 2017-10-10 DIAGNOSIS — T7421XA Adult sexual abuse, confirmed, initial encounter: Secondary | ICD-10-CM | POA: Diagnosis not present

## 2017-10-10 DIAGNOSIS — N898 Other specified noninflammatory disorders of vagina: Secondary | ICD-10-CM | POA: Insufficient documentation

## 2017-10-10 DIAGNOSIS — Z0441 Encounter for examination and observation following alleged adult rape: Secondary | ICD-10-CM | POA: Diagnosis not present

## 2017-10-14 LAB — URINE CYTOLOGY ANCILLARY ONLY
Bacterial vaginitis: NEGATIVE
Candida vaginitis: NEGATIVE
Chlamydia: NEGATIVE
Neisseria Gonorrhea: NEGATIVE
Trichomonas: NEGATIVE

## 2017-10-20 DIAGNOSIS — F609 Personality disorder, unspecified: Secondary | ICD-10-CM | POA: Diagnosis not present

## 2017-10-20 DIAGNOSIS — F70 Mild intellectual disabilities: Secondary | ICD-10-CM | POA: Diagnosis not present

## 2017-10-20 DIAGNOSIS — F251 Schizoaffective disorder, depressive type: Secondary | ICD-10-CM | POA: Diagnosis not present

## 2017-10-22 DIAGNOSIS — F609 Personality disorder, unspecified: Secondary | ICD-10-CM | POA: Diagnosis not present

## 2017-10-22 DIAGNOSIS — F251 Schizoaffective disorder, depressive type: Secondary | ICD-10-CM | POA: Diagnosis not present

## 2017-10-22 DIAGNOSIS — F70 Mild intellectual disabilities: Secondary | ICD-10-CM | POA: Diagnosis not present

## 2017-10-22 DIAGNOSIS — Z118 Encounter for screening for other infectious and parasitic diseases: Secondary | ICD-10-CM | POA: Diagnosis not present

## 2017-11-21 DIAGNOSIS — F609 Personality disorder, unspecified: Secondary | ICD-10-CM | POA: Diagnosis not present

## 2017-11-21 DIAGNOSIS — F251 Schizoaffective disorder, depressive type: Secondary | ICD-10-CM | POA: Diagnosis not present

## 2017-11-21 DIAGNOSIS — F70 Mild intellectual disabilities: Secondary | ICD-10-CM | POA: Diagnosis not present

## 2017-11-28 DIAGNOSIS — F609 Personality disorder, unspecified: Secondary | ICD-10-CM | POA: Diagnosis not present

## 2017-11-28 DIAGNOSIS — F251 Schizoaffective disorder, depressive type: Secondary | ICD-10-CM | POA: Diagnosis not present

## 2017-11-28 DIAGNOSIS — F70 Mild intellectual disabilities: Secondary | ICD-10-CM | POA: Diagnosis not present

## 2017-11-28 DIAGNOSIS — G4730 Sleep apnea, unspecified: Secondary | ICD-10-CM | POA: Diagnosis not present

## 2017-11-28 DIAGNOSIS — Z6834 Body mass index (BMI) 34.0-34.9, adult: Secondary | ICD-10-CM | POA: Diagnosis not present

## 2017-12-26 ENCOUNTER — Ambulatory Visit (INDEPENDENT_AMBULATORY_CARE_PROVIDER_SITE_OTHER): Payer: Medicare Other | Admitting: Family Medicine

## 2017-12-26 ENCOUNTER — Encounter: Payer: Self-pay | Admitting: Family Medicine

## 2017-12-26 VITALS — BP 118/80 | HR 80 | Resp 22

## 2017-12-26 DIAGNOSIS — J310 Chronic rhinitis: Secondary | ICD-10-CM

## 2017-12-26 DIAGNOSIS — J019 Acute sinusitis, unspecified: Secondary | ICD-10-CM | POA: Insufficient documentation

## 2017-12-26 DIAGNOSIS — Z3042 Encounter for surveillance of injectable contraceptive: Secondary | ICD-10-CM | POA: Diagnosis not present

## 2017-12-26 MED ORDER — CETIRIZINE HCL 10 MG PO TABS: 10.0000 mg | ORAL_TABLET | Freq: Every day | ORAL | 5 refills | Status: DC

## 2017-12-26 NOTE — Patient Instructions (Addendum)
1. Mixed rhinitis Stop Allegra at the end of the current pill pack. Begin cetirizine 10 mg once a day as needed for runny nose - Continue the nasal Flonase as prescribed. - Continue montelukast as prescribed.  2. Acute sinusitis - Prednisone 10 mg tablets. Take 2 tablets twice a day for the next 3 days, then take 2 tablets for 1 day, then take 1 tablet on the 5th day, then stop Increase hydration as tolerated - Continue Flonase as prescribed. Point the applicator outward toward the ear. If the applicator is in the right nostril point it out toward the right ear. - Begin saline nasal rinses 1-2 times every day - Call the clinic if your symptoms worsen or do not improve or if you have a fever. Caregiver verbalized instructions.  Call me if this treatment plan is not working well for you  3. Follow up in 2 months or sooner if needed

## 2017-12-26 NOTE — Progress Notes (Signed)
[place] [address] Dept: [phone]  FOLLOW UP NOTE  Patient ID: Sonya Moran, female    DOB: Apr 26, 1975  Age: 42 y.o. MRN: [id number] Date of Office Visit: 12/26/2017  Assessment  Chief Complaint: Sinusitis  HPI Sonya Moran is a 42 year old female who presents to the clinic for a sick visit. She is accompanied by her caregiver who assists with history. Sonya Moran reports that she has had some thick drainage from her nose that starts out green and turns to clear by the end of the morning, facial pressure below her eyes, sinus congestion, and thick post nasal drainage that began about 5-6 days ago. She denies fever and sick contacts. She reports that Sonya Moran is not working as well as it had been and is interested in switching back to cetirizine at the end of the current pill pack, which is reported to be about the 14th of each month. Her current medications are listed in the chart.    Drug Allergies:  Allergies  Allergen Reactions  . Sonya Moran [Griseofulvin] Anaphylaxis and Other (See Comments)    Arm swelling    Physical Exam: BP 118/80 (BP Location: Left Arm, Patient Position: Sitting, Cuff Size: Normal)   Pulse 80   Resp (!) 22    Physical Exam Constitutional:      Appearance: Normal appearance.  HENT:     Head: Normocephalic.     Right Ear: Tympanic membrane normal.     Left Ear: Tympanic membrane normal.     Nose:     Comments: Bilateral nares slightly erythematous with no nasal drainage noted. Pharynx normal. Ears normal. Eyes normal.    Mouth/Throat:     Mouth: Mucous membranes are moist.     Pharynx: Oropharynx is clear.  Eyes:     Conjunctiva/sclera: Conjunctivae normal.  Neck:     Musculoskeletal: Normal range of motion and neck supple.  Cardiovascular:     Rate and Rhythm: Normal rate and regular rhythm.     Heart sounds: Normal heart sounds.     Comments: No murmur noted Pulmonary:     Effort: Pulmonary effort is normal.     Breath  sounds: Normal breath sounds.     Comments: No murmur noted Musculoskeletal: Normal range of motion.  Skin:    General: Skin is warm and dry.  Neurological:     Mental Status: She is alert and oriented to person, place, and time.  Psychiatric:        Mood and Affect: Mood normal.        Behavior: Behavior normal.     Assessment and Plan: 1. Mixed rhinitis   2. Acute sinusitis, recurrence not specified, unspecified location     Meds ordered this encounter  Medications  . cetirizine (ZYRTEC) 10 MG tablet    Sig: Take 1 tablet (10 mg total) by mouth daily.    Dispense:  30 tablet    Refill:  5    Patient Instructions  1. Mixed rhinitis Stop Allegra at the end of the current pill pack. Begin cetirizine 10 mg once a day as needed for runny nose - Continue the nasal Flonase as prescribed. - Continue montelukast as prescribed.  2. Acute sinusitis - Prednisone 10 mg tablets. Take 2 tablets twice a day for the next 3 days, then take 2 tablets for 1 day, then take 1 tablet on the 5th day, then stop Increase hydration as tolerated - Continue Flonase as prescribed.  Point the applicator outward toward the ear. If the applicator is in the right nostril point it out toward the right ear. - Begin saline nasal rinses 1-2 times every day - Call the clinic if your symptoms worsen or do not improve or if you have a fever. Caregiver verbalized instructions.  Call me if this treatment plan is not working well for you  3. Follow up in 2 months or sooner if needed   Return in about 2 months (around 02/26/2018), or if symptoms worsen or fail to improve.    Thank you for the opportunity to care for this patient.  Please do not hesitate to contact me with questions.  [name], FNP [facility]

## 2018-01-12 DIAGNOSIS — F251 Schizoaffective disorder, depressive type: Secondary | ICD-10-CM | POA: Diagnosis not present

## 2018-01-12 DIAGNOSIS — F70 Mild intellectual disabilities: Secondary | ICD-10-CM | POA: Diagnosis not present

## 2018-01-12 DIAGNOSIS — F609 Personality disorder, unspecified: Secondary | ICD-10-CM | POA: Diagnosis not present

## 2018-01-12 DIAGNOSIS — Z79899 Other long term (current) drug therapy: Secondary | ICD-10-CM | POA: Diagnosis not present

## 2018-01-19 DIAGNOSIS — F331 Major depressive disorder, recurrent, moderate: Secondary | ICD-10-CM | POA: Diagnosis not present

## 2018-01-19 DIAGNOSIS — F251 Schizoaffective disorder, depressive type: Secondary | ICD-10-CM | POA: Diagnosis not present

## 2018-01-19 DIAGNOSIS — F70 Mild intellectual disabilities: Secondary | ICD-10-CM | POA: Diagnosis not present

## 2018-01-19 DIAGNOSIS — F609 Personality disorder, unspecified: Secondary | ICD-10-CM | POA: Diagnosis not present

## 2018-01-26 DIAGNOSIS — M79605 Pain in left leg: Secondary | ICD-10-CM | POA: Diagnosis not present

## 2018-01-26 DIAGNOSIS — Z79899 Other long term (current) drug therapy: Secondary | ICD-10-CM | POA: Diagnosis not present

## 2018-01-26 DIAGNOSIS — M79662 Pain in left lower leg: Secondary | ICD-10-CM | POA: Diagnosis not present

## 2018-01-26 DIAGNOSIS — R609 Edema, unspecified: Secondary | ICD-10-CM | POA: Diagnosis not present

## 2018-01-26 DIAGNOSIS — R600 Localized edema: Secondary | ICD-10-CM | POA: Diagnosis not present

## 2018-01-26 DIAGNOSIS — M7989 Other specified soft tissue disorders: Secondary | ICD-10-CM | POA: Diagnosis not present

## 2018-01-27 DIAGNOSIS — F70 Mild intellectual disabilities: Secondary | ICD-10-CM | POA: Diagnosis not present

## 2018-01-27 DIAGNOSIS — F251 Schizoaffective disorder, depressive type: Secondary | ICD-10-CM | POA: Diagnosis not present

## 2018-01-27 DIAGNOSIS — F609 Personality disorder, unspecified: Secondary | ICD-10-CM | POA: Diagnosis not present

## 2018-02-25 ENCOUNTER — Emergency Department (HOSPITAL_BASED_OUTPATIENT_CLINIC_OR_DEPARTMENT_OTHER)
Admission: EM | Admit: 2018-02-25 | Discharge: 2018-02-26 | Disposition: A | Discharge status: Home / Self Care | Payer: Medicare Other | Attending: Emergency Medicine | Admitting: Emergency Medicine

## 2018-02-25 ENCOUNTER — Encounter (HOSPITAL_BASED_OUTPATIENT_CLINIC_OR_DEPARTMENT_OTHER): Payer: Self-pay | Admitting: Emergency Medicine

## 2018-02-25 ENCOUNTER — Other Ambulatory Visit: Payer: Self-pay

## 2018-02-25 DIAGNOSIS — F70 Mild intellectual disabilities: Secondary | ICD-10-CM | POA: Diagnosis not present

## 2018-02-25 DIAGNOSIS — F329 Major depressive disorder, single episode, unspecified: Secondary | ICD-10-CM | POA: Insufficient documentation

## 2018-02-25 DIAGNOSIS — F39 Unspecified mood [affective] disorder: Secondary | ICD-10-CM | POA: Diagnosis not present

## 2018-02-25 DIAGNOSIS — F79 Unspecified intellectual disabilities: Secondary | ICD-10-CM | POA: Diagnosis not present

## 2018-02-25 DIAGNOSIS — F251 Schizoaffective disorder, depressive type: Secondary | ICD-10-CM | POA: Diagnosis not present

## 2018-02-25 DIAGNOSIS — Z046 Encounter for general psychiatric examination, requested by authority: Secondary | ICD-10-CM | POA: Diagnosis not present

## 2018-02-25 DIAGNOSIS — F609 Personality disorder, unspecified: Secondary | ICD-10-CM | POA: Diagnosis not present

## 2018-02-25 DIAGNOSIS — Z008 Encounter for other general examination: Secondary | ICD-10-CM

## 2018-02-25 LAB — URINALYSIS, ROUTINE W REFLEX MICROSCOPIC
Bilirubin Urine: NEGATIVE
Glucose, UA: NEGATIVE mg/dL
Hgb urine dipstick: NEGATIVE
Ketones, ur: NEGATIVE mg/dL
Nitrite: NEGATIVE
Protein, ur: NEGATIVE mg/dL
Specific Gravity, Urine: <1.005 — ABNORMAL LOW (ref 1.005–1.030)
pH: 6.5 (ref 5.0–8.0)

## 2018-02-25 LAB — SALICYLATE LEVEL: Salicylate Lvl: <7 mg/dL (ref 2.8–30.0)

## 2018-02-25 LAB — COMPREHENSIVE METABOLIC PANEL
ALT: 26 U/L (ref 0–44)
AST: 33 U/L (ref 15–41)
Albumin: 4.1 g/dL (ref 3.5–5.0)
Alkaline Phosphatase: 61 U/L (ref 38–126)
Anion gap: 6 (ref 5–15)
BUN: 10 mg/dL (ref 6–20)
CO2: 21 mmol/L — ABNORMAL LOW (ref 22–32)
Calcium: 10.1 mg/dL (ref 8.9–10.3)
Chloride: 109 mmol/L (ref 98–111)
Creatinine, Ser: 0.93 mg/dL (ref 0.44–1.00)
GFR calc Af Amer: >60 mL/min (ref >60)
GFR calc non Af Amer: >60 mL/min (ref >60)
Glucose, Bld: 154 mg/dL — ABNORMAL HIGH (ref 70–99)
Potassium: 3.7 mmol/L (ref 3.5–5.1)
Sodium: 136 mmol/L (ref 135–145)
Total Bilirubin: 0.2 mg/dL — ABNORMAL LOW (ref 0.3–1.2)
Total Protein: 7.4 g/dL (ref 6.5–8.1)

## 2018-02-25 LAB — URINALYSIS, MICROSCOPIC (REFLEX)

## 2018-02-25 LAB — RAPID URINE DRUG SCREEN, HOSP PERFORMED
Amphetamines: NOT DETECTED
Barbiturates: NOT DETECTED
Benzodiazepines: NOT DETECTED
Cocaine: NOT DETECTED
Opiates: NOT DETECTED
Tetrahydrocannabinol: NOT DETECTED

## 2018-02-25 LAB — CBC WITH DIFFERENTIAL/PLATELET
Abs Immature Granulocytes: 0.01 10*3/uL (ref 0.00–0.07)
Basophils Absolute: 0 10*3/uL (ref 0.0–0.1)
Basophils Relative: 0 %
Eosinophils Absolute: 0.1 10*3/uL (ref 0.0–0.5)
Eosinophils Relative: 2 %
HCT: 37.6 % (ref 36.0–46.0)
Hemoglobin: 12 g/dL (ref 12.0–15.0)
Immature Granulocytes: 0 %
Lymphocytes Relative: 41 %
Lymphs Abs: 3 10*3/uL (ref 0.7–4.0)
MCH: 30.5 pg (ref 26.0–34.0)
MCHC: 31.9 g/dL (ref 30.0–36.0)
MCV: 95.7 fL (ref 80.0–100.0)
Monocytes Absolute: 0.4 10*3/uL (ref 0.1–1.0)
Monocytes Relative: 6 %
Neutro Abs: 3.6 10*3/uL (ref 1.7–7.7)
Neutrophils Relative %: 51 %
Platelets: 146 10*3/uL — ABNORMAL LOW (ref 150–400)
RBC: 3.93 MIL/uL (ref 3.87–5.11)
RDW: 11.6 % (ref 11.5–15.5)
WBC: 7.2 10*3/uL (ref 4.0–10.5)
nRBC: 0 % (ref 0.0–0.2)

## 2018-02-25 LAB — TSH: TSH: 1.647 u[IU]/mL (ref 0.350–4.500)

## 2018-02-25 LAB — ACETAMINOPHEN LEVEL: Acetaminophen (Tylenol), Serum: <10 ug/mL — ABNORMAL LOW (ref 10–30)

## 2018-02-25 LAB — PREGNANCY, URINE: Preg Test, Ur: NEGATIVE

## 2018-02-25 LAB — ETHANOL: Alcohol, Ethyl (B): <10 mg/dL (ref <10)

## 2018-02-25 LAB — LITHIUM LEVEL: Lithium Lvl: 0.9 mmol/L (ref 0.60–1.20)

## 2018-02-25 MED ORDER — ACETAMINOPHEN 325 MG PO TABS
650.0000 mg | ORAL_TABLET | Freq: Once | ORAL | Status: AC
  Administered 2018-02-25: 650 mg via ORAL
  Filled 2018-02-25: qty 2

## 2018-02-25 NOTE — ED Notes (Signed)
Patient transferred to [facility] via POV with group home caregivers; patient ambulatory and cooperative to car.

## 2018-02-25 NOTE — ED Notes (Signed)
ED Provider at bedside. 
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
## 2018-02-25 NOTE — ED Notes (Signed)
Patriciaann Clan, NP, patient does not meet inpatient criteria. Patient to follow up with outpatient provider. Ailene Ravel, RN, informed of disposition.

## 2018-02-25 NOTE — ED Provider Notes (Signed)
Emergency Department Provider Note   I have reviewed the triage vital signs and the nursing notes.   HISTORY  Chief Complaint Medical Clearance   HPI Sonya Moran is a 43 y.o. female with PMH of MR, Schizoaffective, and PTSD's to the emergency department for evaluation at the recommendation of her neuropsychiatrist in [place], [place].  The patient lives in a group home and underwent of some [place] changes approximately 1 month ago.  Staff has noticed behavior change which includes increased agitation, attention seeking behavior, intentionally wandering off to unsafe locations, and childlike behavior.  Patient has not verbalizing any suicidal or homicidal ideation but his pain verbally aggressive towards staff at the home.  The patient's primary counselor spoke today with the neuropsychiatrist who recommended she department evaluation for lab testing, IVC, and placement at the [facility] behavioral [facility] facility.  The neuropsychiatrist apparently reached out to the facility who may be able to take her.   Past Medical History:  Diagnosis Date  . Depressive disorder   . Mental retardation   . Post traumatic stress disorder   . Psychotic disorder [facility])     Patient Active Problem List   Diagnosis Date Noted  . Acute sinusitis 12/26/2017  . Mixed rhinitis 08/01/2017  . Ear pain, bilateral 08/01/2017  . Eustachian tube dysfunction, bilateral 08/01/2017  . Plantar fasciitis of right foot 03/28/2015  . Metatarsal deformity 03/28/2015  . Calcaneal spur 03/28/2015  . Anemia 07/23/2013  . Onychomycosis 06/24/2012    Past Surgical History:  Procedure Laterality Date  . BACK SURGERY    . WRIST SURGERY      Allergies Grifulvin v [griseofulvin]  History reviewed. No pertinent family history.  Social History Social History   Tobacco Use  . Smoking status: Never Smoker  . Smokeless tobacco: Never Used  Substance Use Topics  . Alcohol use: No    Alcohol/week: 0.0  standard drinks  . Drug use: No    Review of Systems  Constitutional: No fever/chills Eyes: No visual changes. ENT: No sore throat. Cardiovascular: Denies chest pain. Respiratory: Denies shortness of breath. Gastrointestinal: No abdominal pain.  No nausea, no vomiting.  No diarrhea.  No constipation. Genitourinary: Negative for dysuria. Musculoskeletal: Negative for back pain. Skin: Negative for rash. Neurological: Negative for headaches, focal weakness or numbness.  10-point ROS otherwise negative.  ____________________________________________   PHYSICAL EXAM:  VITAL SIGNS: ED Triage Vitals [02/25/18 1757]  Enc Vitals Group     BP (!) 151/99     Pulse Rate 98     Resp 20     Temp 98.1 F (36.7 C)     Temp Source Oral     SpO2 98 %   Constitutional: Alert and tearful at times.  Eyes: Conjunctivae are normal. Head: Atraumatic. Nose: No congestion/rhinnorhea. Mouth/Throat: Mucous membranes are moist.  Neck: No stridor.   Cardiovascular: Normal rate, regular rhythm. Good peripheral circulation. Grossly normal heart sounds.   Respiratory: Normal respiratory effort.  No retractions. Lungs CTAB. Gastrointestinal: Soft and nontender. No distention.  Musculoskeletal: No lower extremity tenderness nor edema. No gross deformities of extremities. Neurologic:  Normal speech and language. No gross focal neurologic deficits are appreciated.  Skin:  Skin is warm, dry and intact. No rash noted.  ____________________________________________   LABS (all labs ordered are listed, but only abnormal results are displayed)  Labs Reviewed  URINALYSIS, ROUTINE W REFLEX MICROSCOPIC - Abnormal; Notable for the following components:      Result Value  Specific Gravity, Urine <1.005 (*)    Leukocytes,Ua MODERATE (*)    All other components within normal limits  URINALYSIS, MICROSCOPIC (REFLEX) - Abnormal; Notable for the following components:   Bacteria, UA RARE (*)    All other  components within normal limits  COMPREHENSIVE METABOLIC PANEL - Abnormal; Notable for the following components:   CO2 21 (*)    Glucose, Bld 154 (*)    Total Bilirubin 0.2 (*)    All other components within normal limits  ACETAMINOPHEN LEVEL - Abnormal; Notable for the following components:   Acetaminophen (Tylenol), Serum <10 (*)    All other components within normal limits  CBC WITH DIFFERENTIAL/PLATELET - Abnormal; Notable for the following components:   Platelets 146 (*)    All other components within normal limits  PREGNANCY, URINE  RAPID URINE DRUG SCREEN, HOSP PERFORMED  ETHANOL  SALICYLATE LEVEL  LITHIUM LEVEL  TSH   ____________________________________________   PROCEDURES  Procedure(s) performed:   Procedures  None  ____________________________________________   INITIAL IMPRESSION / ASSESSMENT AND PLAN / ED COURSE  Pertinent labs & imaging results that were available during my care of the patient were reviewed by me and considered in my medical decision making (see chart for details).  Patient presents for medical clearance.  Apparently the patient was sent by her neuropsychiatrist in [place], [place] from [facility].  Plan for lab work is detailed in paperwork coming from that facility.  The patient is not under IVC at this time and is agreeable to stay.  Per her care plan she is under constant supervision by a staff member who is here at bedside.  I spoke with Gordy Savers, her guardian, by phone.  He was under the impression that she would be voluntary and had not heard that changed.  At this time I do not feel it necessary to IVC the patient is she is agreeable to stay.  Plan for medical screening labs and will have our behavioral health team evaluate the patient and potentially coordinate with the [facility] behavioral health program for possible placement.  Spoke with [facility]. They recommend AM psych evaluation and social work evaluation.  I will send  the paperwork which I was given by her neuropsychiatrist.  The patient is not under IVC but MR is limiting her capacity.  I discussed this with her caregivers at bedside.  We will transfer by private vehicle as this would be less traumatic for the patient.  I discussed that the only other option would be to file IVC paperwork and have her go by police but the patient is very dependent on her caregivers and we feel that this would be more traumatic than useful to send by police.   I called Dr. Darl Householder who accepts the patient in transfer to the [facility] emergency department.  ____________________________________________  FINAL CLINICAL IMPRESSION(S) / ED DIAGNOSES  Final diagnoses:  Medical clearance for psychiatric admission    MEDICATIONS GIVEN DURING THIS VISIT:  Medications  acetaminophen (TYLENOL) tablet 650 mg (650 mg Oral Given 02/25/18 2013)    Note:  This document was prepared using Dragon voice recognition software and may include unintentional dictation errors.  Nanda Quinton, MD Emergency Medicine   Jezelle Gullick, [facility], MD 02/25/18 [phone]

## 2018-02-25 NOTE — ED Triage Notes (Addendum)
Per legal guardian-referred to ER for danger to self.  States she has been wandering and they are worried about her safety.  Patient has notes from neuropsychiatry that say she needs to be IVC but presents with no IVC paperwork.  Denies SI/HI.      Caregiver is unsure of why patient is here per paperwork from neuropsychiatry but does state that she is physically aggressive.

## 2018-02-25 NOTE — ED Notes (Signed)
TTS consult in progress. °

## 2018-02-26 DIAGNOSIS — F39 Unspecified mood [affective] disorder: Secondary | ICD-10-CM

## 2018-02-26 MED ORDER — QUETIAPINE FUMARATE 100 MG PO TABS
200.0000 mg | ORAL_TABLET | Freq: Every day | ORAL | Status: DC
  Administered 2018-02-26: 200 mg via ORAL
  Filled 2018-02-26: qty 2

## 2018-02-26 MED ORDER — LITHIUM CARBONATE ER 450 MG PO TBCR
450.0000 mg | EXTENDED_RELEASE_TABLET | Freq: Two times a day (BID) | ORAL | Status: DC
  Administered 2018-02-26: 450 mg via ORAL
  Filled 2018-02-26: qty 1

## 2018-02-26 MED ORDER — QUETIAPINE FUMARATE 300 MG PO TABS: 300.0000 mg | ORAL_TABLET | Freq: Every day | ORAL | Status: DC

## 2018-02-26 MED ORDER — AMITRIPTYLINE HCL 25 MG PO TABS: 100.0000 mg | ORAL_TABLET | Freq: Every day | ORAL | Status: DC

## 2018-02-26 MED ORDER — POLYETHYLENE GLYCOL 3350 17 G PO PACK
17.0000 g | PACK | Freq: Every day | ORAL | Status: DC
  Administered 2018-02-26: 17 g via ORAL
  Filled 2018-02-26: qty 1

## 2018-02-26 MED ORDER — FLUOXETINE HCL 20 MG PO CAPS
60.0000 mg | ORAL_CAPSULE | Freq: Every day | ORAL | Status: DC
  Administered 2018-02-26: 60 mg via ORAL
  Filled 2018-02-26: qty 3

## 2018-02-26 NOTE — Discharge Instructions (Signed)
For your behavioral health needs, you are advised to continue treatment with your regular outpatient providers, Amy Boison and Monico Hoar.

## 2018-02-26 NOTE — Consult Note (Addendum)
[facility] Psych ED Discharge  02/26/2018 11:51 AM Sonya Moran  MRN:  [id number] Principal Problem: Mood disorder [facility]) Discharge Diagnoses: Principal Problem:   Mood disorder ([place])   Subjective: Pt was seen and chart reviewed with treatment team and Dr Mariea Clonts. Pt denies suicidal/homicidal ideation, denies auditory/visual hallucinations and does not appear to be responding to internal stimuli. Pt was sent to the emergency room from her neuropsychiatrist in [place], [place] for [employer], lab testing, and placement at [place]. Pt lives at [facility] in [place]. This morning she denies suicidal/homicidal ideations and denies auditory/visual hallucinations. Pt has been calm and cooperative while in the ED. Pt stated she feels safe at home and has never tried to harm herself and has no plan or intent. Pt stated she does not know why she is at the hospital. Pt has a history of Schizoaffective Disorder, PTSD, and MR, per chart review.  Pt's lithium level is therapeutic at 0.90. All other labs are within normal limits with the exception of her glucose at 154 and her CO2 at 21, Pt wears a CPAP, O2 sats are 95% on Room Air, all other VSS. Pt has outpatient providers in the community and at this time it is determined that pt does not meet criteria for inpatient psychiatric admission.Pt should continue on her home medications prescribed by her outpatient provider.  Pt is psychiatrically clear. CSW to facilitate transfer back to Pt's residential care home.   Per chart review: Pt presented to the [facility] as a voluntary patient and then was transferred to [facility] in a private vehicle and seen by TTS. [facility] EDP did not see the need to IVC patient as he felt that would be too traumatizing to the patient. It is unclear from chart review why Pt was transferred from one hospital to another.    Total Time spent with patient: 30 minutes  Past Psychiatric History: Per chart review:   History of Schizoaffective disorder and PTSD and MR.  Past Medical History:  Past Medical History:  Diagnosis Date  . Depressive disorder   . Mental retardation   . Post traumatic stress disorder   . Psychotic disorder [facility])     Past Surgical History:  Procedure Laterality Date  . BACK SURGERY    . WRIST SURGERY     Family History: History reviewed. No pertinent family history. Family Psychiatric  History: None per chart review. Social History:  Social History   Substance and Sexual Activity  Alcohol Use No  . Alcohol/week: 0.0 standard drinks     Social History   Substance and Sexual Activity  Drug Use No    Social History   Socioeconomic History  . Marital status: Single    Spouse name: Not on file  . Number of children: Not on file  . Years of education: Not on file  . Highest education level: Not on file  Occupational History  . Not on file  Social Needs  . Financial resource strain: Not on file  . Food insecurity:    Worry: Not on file    Inability: Not on file  . Transportation needs:    Medical: Not on file    Non-medical: Not on file  Tobacco Use  . Smoking status: Never Smoker  . Smokeless tobacco: Never Used  Substance and Sexual Activity  . Alcohol use: No    Alcohol/week: 0.0 standard drinks  . Drug use: No  . Sexual activity:  Not on file  Lifestyle  . Physical activity:    Days per week: Not on file    Minutes per session: Not on file  . Stress: Not on file  Relationships  . Social connections:    Talks on phone: Not on file    Gets together: Not on file    Attends religious service: Not on file    Active member of club or organization: Not on file    Attends meetings of clubs or organizations: Not on file    Relationship status: Not on file  Other Topics Concern  . Not on file  Social History Narrative  . Not on file    Has this patient used any form of tobacco in the last 30 days? (Cigarettes, Smokeless Tobacco, Cigars, and/or  Pipes) Prescription not provided because: Pt does not smoke  Current Medications: No current facility-administered medications for this encounter.    Current Outpatient Medications  Medication Sig Dispense Refill  . amitriptyline (ELAVIL) 50 MG tablet Take 100 mg by mouth at bedtime.    Marland Kitchen lithium carbonate (ESKALITH) 450 MG CR tablet Take 450 mg by mouth 2 (two) times daily.     Marland Kitchen LORazepam (ATIVAN) 1 MG tablet Take 1 mg by mouth 2 (two) times daily as needed for anxiety.    Marland Kitchen QUEtiapine (SEROQUEL) 200 MG tablet Take 200 mg by mouth daily after breakfast.    . QUEtiapine (SEROQUEL) 300 MG tablet Take 300 mg by mouth at bedtime.    . Azelastine HCl (ASTEPRO) 0.15 % SOLN Place 2 sprays into the nose 2 (two) times daily. (Patient not taking: Reported on 02/26/2018) 30 mL 5  . cetirizine (ZYRTEC) 10 MG tablet Take 1 tablet (10 mg total) by mouth daily. (Patient not taking: Reported on 02/26/2018) 30 tablet 5  . diclofenac sodium (VOLTAREN) 1 % GEL Apply topically as needed.    . elvitegravir-cobicistat-emtricitabine-tenofovir (GENVOYA) 150-150-200-10 MG TABS tablet Take 1 tablet by mouth daily with breakfast. 25 tablet 0  . fexofenadine (ALLEGRA) 180 MG tablet Take 1 tablet (180 mg total) by mouth daily. 30 tablet 0  . FLUoxetine (PROZAC) 20 MG tablet Take 20 mg by mouth daily.    Marland Kitchen FLUoxetine (PROZAC) 40 MG capsule TAKE ONE CAPSULE BY MOUTH DAILY(WITH 20 MG) 30 capsule 5  . fluticasone (FLONASE) 50 MCG/ACT nasal spray Use 2 sprays in each nostril daily if needed for stuffy nose (Patient not taking: Reported on 02/26/2018) 16 g 5  . montelukast (SINGULAIR) 10 MG tablet TAKE ONE TABLET BY MOUTH EACH EVENING TO PREVENT COUGH OR WHEEZE (Patient not taking: Reported on 02/26/2018) 90 tablet 3  . Olopatadine HCl (PAZEO) 0.7 % SOLN Place 1 drop into both eyes 1 day or 1 dose. (Patient not taking: Reported on 02/26/2018) 1 Bottle 5  . SALINE MIST 0.65 % nasal spray 2 SPRAYS EACH NOSTRIL TWICE DAILY. (Patient  not taking: Reported on 02/26/2018) 45 mL 5     Musculoskeletal: Strength & Muscle Tone: within normal limits Gait & Station: normal Patient leans: N/A  Psychiatric Specialty Exam: Physical Exam  Nursing note and vitals reviewed. Constitutional: She appears well-developed and well-nourished.  HENT:  Head: Normocephalic and atraumatic.  Neck: Normal range of motion.  Respiratory: Effort normal.  Musculoskeletal: Normal range of motion.  Neurological: She is alert.  Psychiatric: She has a normal mood and affect. Her speech is normal and behavior is normal. Judgment and thought content normal. Cognition and memory are impaired (History of MR).  Review of Systems  Psychiatric/Behavioral: Negative for hallucinations, substance abuse and suicidal ideas.  All other systems reviewed and are negative.   Blood pressure 117/83, pulse 90, temperature 97.8 F (36.6 C), temperature source Oral, resp. rate 16, SpO2 95 %.There is no height or weight on file to calculate BMI.  General Appearance: Casual  Eye Contact:  Good  Speech:  Clear and Coherent  Volume:  Normal  Mood:  Euthymic  Affect:  Congruent  Thought Process:  Coherent, Linear and Descriptions of Associations: Intact  Orientation:  Full (Time, Place, and Person)  Thought Content:  Logical  Suicidal Thoughts:  No  Homicidal Thoughts:  No  Memory:  Immediate;   Good Recent;   Good Remote;   Fair  Judgement:  Fair  Insight:  Shallow  Psychomotor Activity:  Normal  Concentration:  Concentration: Good and Attention Span: Good  Recall:  [employer] of Knowledge:  Fair  Language:  Good  Akathisia:  No  Handed:  Right  AIMS (if indicated):   N/A  Assets:  Agricultural consultant Housing Social Support  ADL's:  Intact  Cognition:  Impaired,  Mild, History of MR  Sleep:   N/A     Demographic Factors:  Caucasian  Loss Factors: Financial problems/change in socioeconomic status  Historical  Factors: Impulsivity  Risk Reduction Factors:   Sense of responsibility to family  Continued Clinical Symptoms:  Depression:   Anhedonia, Pt has a history of MR and schizoaffective disorder  Cognitive Features That Contribute To Risk:  Closed-mindedness    Suicide Risk:  Minimal: No identifiable suicidal ideation.  Patients presenting with no risk factors but with morbid ruminations; may be classified as minimal risk based on the severity of the depressive symptoms    Plan Of Care/Follow-up recommendations:  Activity:  as tolerated Diet:  Heart Healthy  Disposition and Treatment Plan: Mood Disorder Take all medications as prescribed by your outpatient provider in [place], [place]. Continue your home medications as prescribed by your outpatient provider. Keep all follow-up appointments as scheduled.  Do not consume alcohol or use illegal drugs while on prescription medications. Report any adverse effects from your medications to your primary care provider promptly.  In the event of recurrent symptoms or worsening symptoms, call 911, a crisis hotline, or go to the nearest emergency department for evaluation.   Ethelene Hal, NP 02/26/2018, 11:51 AM   Patient seen face-to-face for psychiatric evaluation, chart reviewed and case discussed with the physician extender and developed treatment plan. Reviewed the information documented and agree with the treatment plan.  Buford Dresser, DO 02/26/18 9:49 PM

## 2018-02-26 NOTE — Progress Notes (Signed)
02/26/2018  1309 Patient c/o constipation. Notified MD. Waiting for orders.

## 2018-02-26 NOTE — Progress Notes (Addendum)
CSW informed patient has been psychiatrically and medically cleared for discharge. CSW aware patient has a legal guardian, Sonya Moran [phone]. CSW attempted to get in contact with Sonya Moran regarding patient disposition. CSW left voicemail for return call. Patient is from [place], [phone]. CSW to follow up regarding pick up time.  1:12pm- CSW received return call from [facility] regarding patient. CSW explained patient was medically and psychiatrically cleared for discharge with the recommendation to follow up with her outpatient providers. Sonya Moran expressed understanding and stated patient is to return to [facility]. CSW spoke with Sonya Moran, [phone], regarding disposition and pick up time. Sonya Moran expressed concern for patient's behaviors. CSW explained patient has been calm and cooperative since being in the ED and does not meet inpatient criteria. Sonya Moran stated she couldn't take patient back without med changes. CSW explained patient would need to follow up with her outpatient provider. Sonya Moran continued to say "no, no, no". CSW explained patient could not remain in the ED as patient has been psychiatrically and medically cleared for discharge. Per Sonya Moran, she would pick up patient at 3pm and then hung up on CSW. CSW has informed RN and NP.  Sonya Moran, [place] Work Department  [employer]  [phone]

## 2018-02-26 NOTE — BH Assessment (Signed)
[facility] Assessment Progress Note  Per Buford Dresser, DO, this pt does not require psychiatric hospitalization at this time.  Pt is to be discharged from [facility] with recommendation to continue treatment with her regular outpatient providers.  This has been included in pt's discharge instructions.  This Probation officer will ask Ollen Barges, CSW, to facilitate pt's return to her residential facility.  Pt's nurse, Tilda Franco, has been notified.  Jalene Mullet, [place] Triage Specialist [phone]

## 2018-02-27 ENCOUNTER — Encounter: Payer: Self-pay | Admitting: Family Medicine

## 2018-02-27 ENCOUNTER — Ambulatory Visit (INDEPENDENT_AMBULATORY_CARE_PROVIDER_SITE_OTHER): Payer: Medicare Other | Admitting: Family Medicine

## 2018-02-27 VITALS — BP 126/90 | HR 105 | Resp 18 | Ht 59.0 in | Wt 169.2 lb

## 2018-02-27 DIAGNOSIS — H6983 Other specified disorders of Eustachian tube, bilateral: Secondary | ICD-10-CM

## 2018-02-27 DIAGNOSIS — J310 Chronic rhinitis: Secondary | ICD-10-CM | POA: Diagnosis not present

## 2018-02-27 NOTE — Progress Notes (Signed)
[place] [address] Dept: [phone]  FOLLOW UP NOTE  Patient ID: Sonya Moran, female    DOB: 06-09-1975  Age: 43 y.o. MRN: [id number] Date of Office Visit: 02/27/2018  Assessment  Chief Complaint: Mixed Rhinitis  HPI Sonya Moran is a 43 year old female who presents to the clinic for a follow up visit. She is accompanied by her caregiver who assists with the history. She was last seen in this clinic on 12/26/2017 by [name], NP for evaluation of mixed rhinitis and acute sinusitis. At that time, she required a prednisone taper for resolution of symptoms. At today's visit, she reports that she has been experiencing intermittent symptoms including nasal congestion, clear nasal drainage, ear congestion, and slight sore throat for about 1 week. She denies fever and sick contacts. She continues cetirizine once a day, Flonase in the mornings and azelastine twice a day, and saline nasal rinses. She demonstrates poor Flonase application technique. Her current medications are listed in the chart.    Drug Allergies:  Allergies  Allergen Reactions  . Grifulvin V [Griseofulvin] Anaphylaxis and Other (See Comments)    Arm swelling    Physical Exam: BP 126/90 (BP Location: Left Arm, Patient Position: Sitting, Cuff Size: Normal)   Pulse (!) 105   Resp 18   Ht 4\' 11"  (1.499 m)   Wt 169 lb 3.2 oz (76.7 kg)   SpO2 98%   BMI 34.17 kg/m    Physical Exam Vitals signs reviewed.  Constitutional:      Appearance: Normal appearance.  HENT:     Head: Normocephalic and atraumatic.     Right Ear: Tympanic membrane normal.     Left Ear: Tympanic membrane normal.     Nose:     Comments: Bilateral nares slightly erythematous with clear nasal drainage noted. Pharynx slightly erythematous with no exudate noted. Ears normal. Eyes normal. Eyes:     Conjunctiva/sclera: Conjunctivae normal.  Neck:     Musculoskeletal: Normal range of motion and neck supple.  Cardiovascular:     Rate and Rhythm: Normal rate and regular rhythm.     Heart sounds: Normal heart sounds. No murmur.  Pulmonary:     Effort: Pulmonary effort is normal.     Breath sounds: Normal breath sounds.     Comments: Lungs clear to auscultation Musculoskeletal: Normal range of motion.  Skin:    General: Skin is warm and dry.  Neurological:     Mental Status: She is alert and oriented to person, place, and time.  Psychiatric:        Mood and Affect: Mood normal.        Behavior: Behavior normal.        Thought Content: Thought content normal.        Judgment: Judgment normal.     Assessment and Plan: 1. Mixed rhinitis   2. Eustachian tube dysfunction, bilateral      Patient Instructions  1. Mixed rhinitis - Continue cetirizine 10 mg once a day as needed for runny nose - Continue the nasal Flonase as prescribed. Right nostril point the applicator toward the right ear. Left nostril point the applicator toward the left ear - Continue montelukast as prescribed.  - Continue saline nasal rinses as needed  Call me if this treatment plan is not working well for you  2. Follow up in 6 months or sooner if needed    Return in about 6 months (around 08/28/2018), or if symptoms  worsen or fail to improve.    Thank you for the opportunity to care for this patient.  Please do not hesitate to contact me with questions.  [name], FNP [facility]

## 2018-02-27 NOTE — Patient Instructions (Addendum)
1. Mixed rhinitis - Continue cetirizine 10 mg once a day as needed for runny nose - Continue the nasal Flonase as prescribed. Right nostril point the applicator toward the right ear. Left nostril point the applicator toward the left ear - Continue montelukast as prescribed.  - Continue saline nasal rinses as needed  Call me if this treatment plan is not working well for you  2. Follow up in 6 months or sooner if needed

## 2018-03-18 DIAGNOSIS — Z0000 Encounter for general adult medical examination without abnormal findings: Secondary | ICD-10-CM | POA: Diagnosis not present

## 2018-03-26 ENCOUNTER — Other Ambulatory Visit: Payer: Self-pay

## 2018-03-26 ENCOUNTER — Telehealth: Payer: Self-pay | Admitting: Allergy & Immunology

## 2018-03-26 MED ORDER — FLUTICASONE PROPIONATE 50 MCG/ACT NA SUSP: NASAL | 5 refills | Status: DC

## 2018-03-26 NOTE — Telephone Encounter (Signed)
Refill sent in

## 2018-03-26 NOTE — Telephone Encounter (Signed)
Pt called and needs to have Flonase into [facility] on [address] [phone].

## 2018-04-15 DIAGNOSIS — F609 Personality disorder, unspecified: Secondary | ICD-10-CM | POA: Diagnosis not present

## 2018-04-15 DIAGNOSIS — F251 Schizoaffective disorder, depressive type: Secondary | ICD-10-CM | POA: Diagnosis not present

## 2018-04-15 DIAGNOSIS — F70 Mild intellectual disabilities: Secondary | ICD-10-CM | POA: Diagnosis not present

## 2018-04-28 DIAGNOSIS — F251 Schizoaffective disorder, depressive type: Secondary | ICD-10-CM | POA: Diagnosis not present

## 2018-04-28 DIAGNOSIS — F70 Mild intellectual disabilities: Secondary | ICD-10-CM | POA: Diagnosis not present

## 2018-04-28 DIAGNOSIS — F609 Personality disorder, unspecified: Secondary | ICD-10-CM | POA: Diagnosis not present

## 2018-05-12 DIAGNOSIS — M722 Plantar fascial fibromatosis: Secondary | ICD-10-CM | POA: Diagnosis not present

## 2018-05-12 DIAGNOSIS — M7732 Calcaneal spur, left foot: Secondary | ICD-10-CM | POA: Diagnosis not present

## 2018-05-12 DIAGNOSIS — M79672 Pain in left foot: Secondary | ICD-10-CM | POA: Diagnosis not present

## 2018-05-12 DIAGNOSIS — M7662 Achilles tendinitis, left leg: Secondary | ICD-10-CM | POA: Diagnosis not present

## 2018-05-14 DIAGNOSIS — H608X3 Other otitis externa, bilateral: Secondary | ICD-10-CM | POA: Diagnosis not present

## 2018-05-14 DIAGNOSIS — F79 Unspecified intellectual disabilities: Secondary | ICD-10-CM | POA: Diagnosis not present

## 2018-05-14 DIAGNOSIS — K580 Irritable bowel syndrome with diarrhea: Secondary | ICD-10-CM | POA: Diagnosis not present

## 2018-05-14 DIAGNOSIS — F2089 Other schizophrenia: Secondary | ICD-10-CM | POA: Diagnosis not present

## 2018-05-14 DIAGNOSIS — K219 Gastro-esophageal reflux disease without esophagitis: Secondary | ICD-10-CM | POA: Diagnosis not present

## 2018-05-14 DIAGNOSIS — Z6834 Body mass index (BMI) 34.0-34.9, adult: Secondary | ICD-10-CM | POA: Diagnosis not present

## 2018-05-14 DIAGNOSIS — E039 Hypothyroidism, unspecified: Secondary | ICD-10-CM | POA: Diagnosis not present

## 2018-05-14 DIAGNOSIS — E669 Obesity, unspecified: Secondary | ICD-10-CM | POA: Diagnosis not present

## 2018-05-14 DIAGNOSIS — G4730 Sleep apnea, unspecified: Secondary | ICD-10-CM | POA: Diagnosis not present

## 2018-05-26 DIAGNOSIS — F70 Mild intellectual disabilities: Secondary | ICD-10-CM | POA: Diagnosis not present

## 2018-05-26 DIAGNOSIS — F251 Schizoaffective disorder, depressive type: Secondary | ICD-10-CM | POA: Diagnosis not present

## 2018-05-26 DIAGNOSIS — F609 Personality disorder, unspecified: Secondary | ICD-10-CM | POA: Diagnosis not present

## 2018-05-28 ENCOUNTER — Other Ambulatory Visit (HOSPITAL_COMMUNITY)
Admission: RE | Admit: 2018-05-28 | Discharge: 2018-05-28 | Disposition: A | Discharge status: Home / Self Care | Payer: Medicare Other | Source: Ambulatory Visit | Attending: Family Medicine | Admitting: Family Medicine

## 2018-05-28 ENCOUNTER — Other Ambulatory Visit: Payer: Self-pay | Admitting: Family Medicine

## 2018-05-28 DIAGNOSIS — Q828 Other specified congenital malformations of skin: Secondary | ICD-10-CM | POA: Diagnosis not present

## 2018-05-28 DIAGNOSIS — F79 Unspecified intellectual disabilities: Secondary | ICD-10-CM | POA: Diagnosis not present

## 2018-05-28 DIAGNOSIS — N898 Other specified noninflammatory disorders of vagina: Secondary | ICD-10-CM | POA: Insufficient documentation

## 2018-05-28 DIAGNOSIS — H608X3 Other otitis externa, bilateral: Secondary | ICD-10-CM | POA: Diagnosis not present

## 2018-05-28 DIAGNOSIS — N771 Vaginitis, vulvitis and vulvovaginitis in diseases classified elsewhere: Secondary | ICD-10-CM | POA: Diagnosis not present

## 2018-06-02 LAB — URINE CYTOLOGY ANCILLARY ONLY
Bacterial vaginitis: NEGATIVE
Candida vaginitis: NEGATIVE
Chlamydia: NEGATIVE
Neisseria Gonorrhea: NEGATIVE
Trichomonas: NEGATIVE

## 2018-06-09 DIAGNOSIS — H10413 Chronic giant papillary conjunctivitis, bilateral: Secondary | ICD-10-CM | POA: Diagnosis not present

## 2018-06-29 ENCOUNTER — Other Ambulatory Visit: Payer: Self-pay | Admitting: Family Medicine

## 2018-06-29 DIAGNOSIS — L918 Other hypertrophic disorders of the skin: Secondary | ICD-10-CM | POA: Diagnosis not present

## 2018-06-29 DIAGNOSIS — N760 Acute vaginitis: Secondary | ICD-10-CM | POA: Diagnosis not present

## 2018-07-07 ENCOUNTER — Emergency Department (HOSPITAL_BASED_OUTPATIENT_CLINIC_OR_DEPARTMENT_OTHER): Payer: Medicare Other

## 2018-07-07 ENCOUNTER — Emergency Department (HOSPITAL_COMMUNITY): Payer: Medicare Other

## 2018-07-07 ENCOUNTER — Other Ambulatory Visit (HOSPITAL_COMMUNITY)
Admission: RE | Admit: 2018-07-07 | Discharge: 2018-07-07 | Disposition: A | Discharge status: Home / Self Care | Payer: Medicare Other | Source: Ambulatory Visit | Attending: Family Medicine | Admitting: Family Medicine

## 2018-07-07 ENCOUNTER — Emergency Department (HOSPITAL_BASED_OUTPATIENT_CLINIC_OR_DEPARTMENT_OTHER)
Admission: EM | Admit: 2018-07-07 | Discharge: 2018-07-08 | Disposition: A | Discharge status: Home / Self Care | Payer: Medicare Other | Attending: Emergency Medicine | Admitting: Emergency Medicine

## 2018-07-07 ENCOUNTER — Encounter (HOSPITAL_BASED_OUTPATIENT_CLINIC_OR_DEPARTMENT_OTHER): Payer: Self-pay

## 2018-07-07 ENCOUNTER — Other Ambulatory Visit: Payer: Self-pay | Admitting: Family Medicine

## 2018-07-07 ENCOUNTER — Other Ambulatory Visit: Payer: Self-pay

## 2018-07-07 DIAGNOSIS — Z79899 Other long term (current) drug therapy: Secondary | ICD-10-CM | POA: Insufficient documentation

## 2018-07-07 DIAGNOSIS — N771 Vaginitis, vulvitis and vulvovaginitis in diseases classified elsewhere: Secondary | ICD-10-CM | POA: Diagnosis not present

## 2018-07-07 DIAGNOSIS — N760 Acute vaginitis: Secondary | ICD-10-CM | POA: Diagnosis not present

## 2018-07-07 DIAGNOSIS — N898 Other specified noninflammatory disorders of vagina: Secondary | ICD-10-CM | POA: Insufficient documentation

## 2018-07-07 DIAGNOSIS — R0989 Other specified symptoms and signs involving the circulatory and respiratory systems: Secondary | ICD-10-CM | POA: Insufficient documentation

## 2018-07-07 DIAGNOSIS — R0981 Nasal congestion: Secondary | ICD-10-CM | POA: Insufficient documentation

## 2018-07-07 DIAGNOSIS — R0602 Shortness of breath: Secondary | ICD-10-CM | POA: Diagnosis not present

## 2018-07-07 DIAGNOSIS — E669 Obesity, unspecified: Secondary | ICD-10-CM | POA: Diagnosis not present

## 2018-07-07 DIAGNOSIS — Z20828 Contact with and (suspected) exposure to other viral communicable diseases: Secondary | ICD-10-CM | POA: Insufficient documentation

## 2018-07-07 DIAGNOSIS — N390 Urinary tract infection, site not specified: Secondary | ICD-10-CM | POA: Diagnosis not present

## 2018-07-07 MED ORDER — OXYMETAZOLINE HCL 0.05 % NA SOLN
2.0000 | Freq: Two times a day (BID) | NASAL | Status: DC | PRN
  Administered 2018-07-08: 2 via NASAL
  Filled 2018-07-07: qty 30

## 2018-07-07 NOTE — ED Triage Notes (Addendum)
Per caregiver pt went to bed, but then woke up and c/o "breathing hard." Pt noted to have labored breathing. When asked, pt states she is breathing through her mouth because her nose is stuffy.

## 2018-07-07 NOTE — ED Provider Notes (Addendum)
[place] DEPT [facility] Provider Note: Georgena Spurling, MD, FACEP  CSN: [id number] MRN: [id number] ARRIVAL: 07/07/18 at 2306 ROOM: MH07/MH07   CHIEF COMPLAINT  Shortness of Breath  5 caveat: Mental retardation HISTORY OF PRESENT ILLNESS  07/07/18 11:31 PM Sonya Moran is a 43 y.o. female who was seen by her PCP earlier today and diagnosed with a vulvovaginal yeast infection and possibly a urinary tract infection.  Her caretaker filled her prescription for Diflucan 150 mg but states that the cream she was prescribed was $500 and she could not afford that.  Her caretaker brings her in this evening due to labored breathing.  The patient told her caretaker that it was due to her nose being stuffy and she has a headache.  She is already using saline and Flonase sprays.  She also told to the triage nurse that her chest hurts.  She has had no known COVID-19 exposures.    Past Medical History:  Diagnosis Date  . Depressive disorder   . Mental retardation   . Post traumatic stress disorder   . Psychotic disorder [facility])     Past Surgical History:  Procedure Laterality Date  . BACK SURGERY    . WRIST SURGERY      No family history on file.  Social History   Tobacco Use  . Smoking status: Never Smoker  . Smokeless tobacco: Never Used  Substance Use Topics  . Alcohol use: No    Alcohol/week: 0.0 standard drinks  . Drug use: No    Prior to Admission medications   Medication Sig Start Date End Date Taking? Authorizing Provider  celecoxib (CELEBREX) 100 MG capsule Take 100 mg by mouth 2 (two) times daily.   Yes [provider]  dexlansoprazole (DEXILANT) 60 MG capsule Take 60 mg by mouth daily.   Yes [provider]  fenofibrate (TRICOR) 145 MG tablet Take 145 mg by mouth daily.   Yes [provider]  lamoTRIgine (LAMICTAL) 100 MG tablet Take 100 mg by mouth daily.   Yes [provider]  mirabegron ER (MYRBETRIQ) 25 MG TB24 tablet Take 25 mg by  mouth daily.   Yes [provider]  amitriptyline (ELAVIL) 50 MG tablet Take 100 mg by mouth at bedtime.    [provider]  Azelastine HCl (ASTEPRO) 0.15 % SOLN Place 2 sprays into the nose 2 (two) times daily. 02/07/17   Valentina Shaggy, MD  cetirizine (ZYRTEC) 10 MG tablet Take 1 tablet (10 mg total) by mouth daily. 01/26/18 07/25/18  Dara Hoyer, FNP  diclofenac sodium (VOLTAREN) 1 % GEL Apply topically as needed.    [provider]  fluticasone Asencion Islam) 50 MCG/ACT nasal spray Use 2 sprays in each nostril daily if needed for stuffy nose 03/26/18   Ambs, Kathrine Cords, FNP  lithium carbonate (ESKALITH) 450 MG CR tablet Take 450 mg by mouth 2 (two) times daily.     [provider]  LORazepam (ATIVAN) 1 MG tablet Take 1 mg by mouth 2 (two) times daily as needed for anxiety.    [provider]  montelukast (SINGULAIR) 10 MG tablet TAKE ONE TABLET BY MOUTH EACH EVENING TO PREVENT COUGH OR WHEEZE 02/07/17   Valentina Shaggy, MD  Olopatadine HCl (PAZEO) 0.7 % SOLN Place 1 drop into both eyes 1 day or 1 dose. 02/07/17   Valentina Shaggy, MD  SALINE MIST 0.65 % nasal spray 2 SPRAYS EACH NOSTRIL TWICE DAILY. 02/10/17   Salvatore Marvel  Louis, MD    Allergies Grifulvin v [griseofulvin]   REVIEW OF SYSTEMS  Negative except as noted here or in the History of Present Illness.   PHYSICAL EXAMINATION  Initial Vital Signs Blood pressure (!) 140/91, pulse 100, temperature 99.2 F (37.3 C), temperature source Oral, resp. rate (!) 24, height 4\' 11"  (1.499 m), weight 78 kg, SpO2 100 %.  Examination General: Well-developed, well-nourished female in no acute distress; appearance consistent with age of record HENT: normocephalic; atraumatic; nasal congestion; TMs normal Eyes: pupils equal, round and reactive to light; extraocular muscles intact Neck: supple Heart: regular rate and rhythm Lungs: clear to auscultation bilaterally Chest: Anterior chest wall  tenderness Back: Kyphosis Abdomen: soft; nondistended; nontender; bowel sounds present Extremities: No deformity; full range of motion; pulses normal Neurologic: Awake, alert; motor function intact in all extremities and symmetric; no facial droop Skin: Warm and dry Psychiatric: Normal mood and affect   RESULTS  Summary of this visit's results, reviewed by myself:   EKG Interpretation  Date/Time:  Tuesday July 07 2018 23:22:14 EDT Ventricular Rate:  99 PR Interval:    QRS Duration: 100 QT Interval:  363 QTC Calculation: 466 R Axis:   14 Text Interpretation:  Sinus rhythm Baseline wander in lead(s) I V1 Otherwise no significant change Confirmed by Shanon Rosser [phone]) on 07/07/2018 11:25:15 PM      Laboratory Studies: Results for orders placed or performed during the hospital encounter of 07/07/18 (from the past 24 hour(s))  Urinalysis, Routine w reflex microscopic     Status: Abnormal   Collection Time: 07/07/18 11:49 PM  Result Value Ref Range   Color, Urine COLORLESS (A) YELLOW   APPearance CLEAR CLEAR   Specific Gravity, Urine <1.005 (L) 1.005 - 1.030   pH 7.0 5.0 - 8.0   Glucose, UA NEGATIVE NEGATIVE mg/dL   Hgb urine dipstick NEGATIVE NEGATIVE   Bilirubin Urine NEGATIVE NEGATIVE   Ketones, ur NEGATIVE NEGATIVE mg/dL   Protein, ur NEGATIVE NEGATIVE mg/dL   Nitrite NEGATIVE NEGATIVE   Leukocytes,Ua TRACE (A) NEGATIVE  SARS Coronavirus 2 (Hosp order,Performed in [place] lab via [employer] ID)     Status: None   Collection Time: 07/07/18 11:49 PM   Specimen: Dry Nasal Swab ([facility] Now)  Result Value Ref Range   SARS Coronavirus 2 ([facility] Now) NEGATIVE NEGATIVE  Urinalysis, Microscopic (reflex)     Status: None   Collection Time: 07/07/18 11:49 PM  Result Value Ref Range   RBC / HPF NONE SEEN 0 - 5 RBC/hpf   WBC, UA 0-5 0 - 5 WBC/hpf   Bacteria, UA NONE SEEN NONE SEEN   Squamous Epithelial / LPF 0-5 0 - 5   Imaging Studies: Dg Chest 2 View  Result Date:  07/08/2018 CLINICAL DATA:  Shortness of breath EXAM: CHEST - 2 VIEW COMPARISON:  April 16, 2017 FINDINGS: There are small bilateral pleural effusions. There are bibasilar airspace opacities. There is generalized volume overload. No acute osseous abnormality. No pneumothorax. IMPRESSION: 1. Borderline cardiomegaly with evidence of vascular congestion. 2. Bibasilar airspace opacities which may represent atelectasis or infiltrate. Electronically Signed   By: Constance Holster M.D.   On: 07/08/2018 00:40    ED COURSE and MDM  Nursing notes and initial vitals signs, including pulse oximetry, reviewed.  Vitals:   07/07/18 2313 07/07/18 2316  BP: (!) 140/91   Pulse: 100   Resp: (!) 24   Temp: 99.2 F (37.3 C)   TempSrc: Oral   SpO2:  100%   Weight:  78 kg  Height:  4\' 11"  (1.499 m)   12:52 AM The patient's breathing difficulty seems to be primarily nasal.  We will treat her with short course of Afrin. Clinically her presentation is not consistent with bilateral pneumonia.  Changes seen on x-ray may be due to kyphosis and poor inspiration.  Her caretaker, who is with her daily, will follow her temperatures and breathing and return if symptoms worsen.  She was also advised of changes suggesting pulmonary vascular congestion and will follow-up with her PCP regarding this.  The patient's caretaker does not wish the patient to be placed on an antibiotic at this time as she will be following her closely.  A message has been sent to her PCP, Dr. Criss Rosales, via Epic.  Sonya Moran was evaluated in Emergency Department on 07/08/2018 for the symptoms described in the history of present illness. She was evaluated in the context of the global COVID-19 pandemic, which necessitated consideration that the patient might be at risk for infection with the SARS-CoV-2 virus that causes COVID-19. Institutional protocols and algorithms that pertain to the evaluation of patients at risk for COVID-19 are in a state of rapid  change based on information released by regulatory bodies including the CDC and federal and state organizations. These policies and algorithms were followed during the patient's care in the ED.   PROCEDURES    ED DIAGNOSES     ICD-10-CM   1. Nasal congestion  R09.81   2. Pulmonary vascular congestion  R09.89        Shanon Rosser, MD 07/08/18 0056    Shanon Rosser, MD 07/08/18 [phone]

## 2018-07-07 NOTE — ED Triage Notes (Signed)
Pt pointed to her chest and stated it hurts.

## 2018-07-08 DIAGNOSIS — R0602 Shortness of breath: Secondary | ICD-10-CM | POA: Diagnosis not present

## 2018-07-08 DIAGNOSIS — R0981 Nasal congestion: Secondary | ICD-10-CM | POA: Diagnosis not present

## 2018-07-08 LAB — URINALYSIS, ROUTINE W REFLEX MICROSCOPIC
Bilirubin Urine: NEGATIVE
Glucose, UA: NEGATIVE mg/dL
Hgb urine dipstick: NEGATIVE
Ketones, ur: NEGATIVE mg/dL
Nitrite: NEGATIVE
Protein, ur: NEGATIVE mg/dL
Specific Gravity, Urine: <1.005 — ABNORMAL LOW (ref 1.005–1.030)
pH: 7 (ref 5.0–8.0)

## 2018-07-08 LAB — URINALYSIS, MICROSCOPIC (REFLEX)
Bacteria, UA: NONE SEEN
RBC / HPF: NONE SEEN RBC/hpf (ref 0–5)

## 2018-07-08 LAB — SARS CORONAVIRUS 2 AG (30 MIN TAT): SARS Coronavirus 2 Ag: NEGATIVE

## 2018-07-08 MED ORDER — ACETAMINOPHEN 325 MG PO TABS
650.0000 mg | ORAL_TABLET | Freq: Once | ORAL | Status: AC
  Administered 2018-07-08: 01:00:00 650 mg via ORAL
  Filled 2018-07-08: qty 2

## 2018-07-08 MED ORDER — CLOTRIMAZOLE 1 % EX CREA: TOPICAL_CREAM | CUTANEOUS | 0 refills | Status: DC

## 2018-07-09 ENCOUNTER — Other Ambulatory Visit: Payer: Self-pay | Admitting: Family Medicine

## 2018-07-09 LAB — URINE CYTOLOGY ANCILLARY ONLY
Bacterial vaginitis: NEGATIVE
Candida vaginitis: NEGATIVE
Chlamydia: NEGATIVE
Neisseria Gonorrhea: NEGATIVE
Trichomonas: NEGATIVE

## 2018-07-09 LAB — URINE CULTURE

## 2018-07-09 LAB — NOVEL CORONAVIRUS, NAA (HOSP ORDER, SEND-OUT TO REF LAB; TAT 18-24 HRS): SARS-CoV-2, NAA: NOT DETECTED

## 2018-07-21 DIAGNOSIS — N390 Urinary tract infection, site not specified: Secondary | ICD-10-CM | POA: Diagnosis not present

## 2018-07-21 DIAGNOSIS — L299 Pruritus, unspecified: Secondary | ICD-10-CM | POA: Diagnosis not present

## 2018-07-21 DIAGNOSIS — F79 Unspecified intellectual disabilities: Secondary | ICD-10-CM | POA: Diagnosis not present

## 2018-08-04 DIAGNOSIS — F79 Unspecified intellectual disabilities: Secondary | ICD-10-CM | POA: Diagnosis not present

## 2018-08-04 DIAGNOSIS — N390 Urinary tract infection, site not specified: Secondary | ICD-10-CM | POA: Diagnosis not present

## 2018-08-04 DIAGNOSIS — N760 Acute vaginitis: Secondary | ICD-10-CM | POA: Diagnosis not present

## 2018-08-04 DIAGNOSIS — E669 Obesity, unspecified: Secondary | ICD-10-CM | POA: Diagnosis not present

## 2018-08-21 DIAGNOSIS — K210 Gastro-esophageal reflux disease with esophagitis: Secondary | ICD-10-CM | POA: Diagnosis not present

## 2018-08-26 DIAGNOSIS — F251 Schizoaffective disorder, depressive type: Secondary | ICD-10-CM | POA: Diagnosis not present

## 2018-08-26 DIAGNOSIS — F609 Personality disorder, unspecified: Secondary | ICD-10-CM | POA: Diagnosis not present

## 2018-08-26 DIAGNOSIS — F70 Mild intellectual disabilities: Secondary | ICD-10-CM | POA: Diagnosis not present

## 2018-08-28 ENCOUNTER — Ambulatory Visit (INDEPENDENT_AMBULATORY_CARE_PROVIDER_SITE_OTHER): Payer: Medicare Other | Admitting: Family Medicine

## 2018-08-28 ENCOUNTER — Encounter: Payer: Self-pay | Admitting: Family Medicine

## 2018-08-28 ENCOUNTER — Other Ambulatory Visit: Payer: Self-pay

## 2018-08-28 VITALS — BP 118/80 | HR 95 | Temp 99.4°F | Resp 16 | Ht <= 58 in | Wt 172.4 lb

## 2018-08-28 DIAGNOSIS — H6983 Other specified disorders of Eustachian tube, bilateral: Secondary | ICD-10-CM

## 2018-08-28 DIAGNOSIS — J310 Chronic rhinitis: Secondary | ICD-10-CM | POA: Diagnosis not present

## 2018-08-28 NOTE — Patient Instructions (Addendum)
1. Mixed rhinitis - Continue cetirizine 10 mg once a day as needed for runny nose - Continue the nasal Flonase as prescribed. Right nostril point the applicator toward the right ear. Left nostril point the applicator toward the left ear - Continue montelukast as prescribed.  - Continue azelastine nasal spray 2 sprays twice a day as needed for a runny nose or sneezing - Continue saline nasal rinses as needed  2. Eustachian tube dysfunction - Continue with the plan above  Call me if this treatment plan is not working well for you  3. Follow up in 6 months or sooner if needed

## 2018-08-28 NOTE — Progress Notes (Signed)
[place]  [address] Dept: [phone]  FOLLOW UP NOTE  Patient ID: Sonya Moran, female    DOB: [birth year]  Age: 43 y.o. MRN: [id number] Date of Office Visit: 08/28/2018  Assessment  Chief Complaint: Allergic Rhinitis   HPI Sonya Moran is a 43 year old female who presents to the clinic for a follow up visit. She is accompanied by her caretaker who assists with history. She was last seen in this clinic on 02/27/2018 by [name], FNP for evaluation of allergic rhinitis and eustachian tube dysfunction. At today's visit, she reports her allergic rhinitis has been well controlled when someone can help her apply her nasal sprays. She continues cetirizine 10 mg once a day and uses Flonase and azelastine daily. She occasionally uses a saline nasal spray, however, she does not use a nasal saline rinse. She reports occasional fullness in her ares with no pain or discharge noted. Her current medications are listed in the chart.   Drug Allergies:  Allergies  Allergen Reactions  . Grifulvin V [Griseofulvin] Anaphylaxis and Other (See Comments)    Arm swelling    Physical Exam: BP 118/80   Pulse 95   Temp 99.4 F (37.4 C) (Temporal)   Resp 16   Ht 4' 9.5" (1.461 m)   Wt 172 lb 6.4 oz (78.2 kg)   SpO2 96%   BMI 36.66 kg/m    Physical Exam Vitals signs reviewed.  Constitutional:      Appearance: Normal appearance.  HENT:     Head: Normocephalic and atraumatic.     Right Ear: Tympanic membrane normal.     Left Ear: Tympanic membrane normal.     Nose:     Comments: Bilateral nares slightly erythematous with clear nasal drainage noted. Pharynx normal. Ears normal. Eyes normal.    Mouth/Throat:     Pharynx: Oropharynx is clear.  Eyes:     Conjunctiva/sclera: Conjunctivae normal.  Neck:     Musculoskeletal: Normal range of motion and neck supple.  Cardiovascular:     Rate and Rhythm: Normal rate and regular rhythm.     Heart sounds: Normal heart sounds. No  murmur.  Pulmonary:     Effort: Pulmonary effort is normal.     Breath sounds: Normal breath sounds.     Comments: Lungs clear to auscultation Musculoskeletal: Normal range of motion.  Skin:    General: Skin is warm and dry.  Neurological:     Mental Status: She is alert and oriented to person, place, and time.  Psychiatric:        Mood and Affect: Mood normal.        Behavior: Behavior normal.        Thought Content: Thought content normal.        Judgment: Judgment normal.     Assessment and Plan: 1. Mixed rhinitis   2. Eustachian tube dysfunction, bilateral     Patient Instructions  1. Mixed rhinitis - Continue cetirizine 10 mg once a day as needed for runny nose - Continue the nasal Flonase as prescribed. Right nostril point the applicator toward the right ear. Left nostril point the applicator toward the left ear - Continue montelukast as prescribed.  - Continue azelastine nasal spray 2 sprays twice a day as needed for a runny nose or sneezing - Continue saline nasal rinses as needed  2. Eustachian tube dysfunction - Continue with the plan above  Call me if this treatment plan is not working  well for you  3. Follow up in 6 months or sooner if needed   Return in about 6 months (around 02/28/2019), or if symptoms worsen or fail to improve.    Thank you for the opportunity to care for this patient.  Please do not hesitate to contact me with questions.  [name], FNP [facility]

## 2018-09-03 DIAGNOSIS — F609 Personality disorder, unspecified: Secondary | ICD-10-CM | POA: Diagnosis not present

## 2018-09-03 DIAGNOSIS — F70 Mild intellectual disabilities: Secondary | ICD-10-CM | POA: Diagnosis not present

## 2018-09-03 DIAGNOSIS — F251 Schizoaffective disorder, depressive type: Secondary | ICD-10-CM | POA: Diagnosis not present

## 2018-09-14 DIAGNOSIS — Z309 Encounter for contraceptive management, unspecified: Secondary | ICD-10-CM | POA: Diagnosis not present

## 2018-09-24 DIAGNOSIS — K210 Gastro-esophageal reflux disease with esophagitis: Secondary | ICD-10-CM | POA: Diagnosis not present

## 2018-10-02 ENCOUNTER — Other Ambulatory Visit: Payer: Self-pay | Admitting: Family Medicine

## 2018-10-09 ENCOUNTER — Other Ambulatory Visit: Payer: Self-pay | Admitting: Family Medicine

## 2018-10-12 DIAGNOSIS — Z23 Encounter for immunization: Secondary | ICD-10-CM | POA: Diagnosis not present

## 2018-12-04 DIAGNOSIS — L409 Psoriasis, unspecified: Secondary | ICD-10-CM | POA: Diagnosis not present

## 2018-12-15 DIAGNOSIS — F70 Mild intellectual disabilities: Secondary | ICD-10-CM | POA: Diagnosis not present

## 2018-12-15 DIAGNOSIS — F609 Personality disorder, unspecified: Secondary | ICD-10-CM | POA: Diagnosis not present

## 2018-12-15 DIAGNOSIS — F251 Schizoaffective disorder, depressive type: Secondary | ICD-10-CM | POA: Diagnosis not present

## 2018-12-21 DIAGNOSIS — F609 Personality disorder, unspecified: Secondary | ICD-10-CM | POA: Diagnosis not present

## 2018-12-21 DIAGNOSIS — F251 Schizoaffective disorder, depressive type: Secondary | ICD-10-CM | POA: Diagnosis not present

## 2018-12-21 DIAGNOSIS — R799 Abnormal finding of blood chemistry, unspecified: Secondary | ICD-10-CM | POA: Diagnosis not present

## 2018-12-21 DIAGNOSIS — D649 Anemia, unspecified: Secondary | ICD-10-CM | POA: Diagnosis not present

## 2018-12-21 DIAGNOSIS — F70 Mild intellectual disabilities: Secondary | ICD-10-CM | POA: Diagnosis not present

## 2019-02-11 DIAGNOSIS — F251 Schizoaffective disorder, depressive type: Secondary | ICD-10-CM | POA: Diagnosis not present

## 2019-02-11 DIAGNOSIS — F70 Mild intellectual disabilities: Secondary | ICD-10-CM | POA: Diagnosis not present

## 2019-02-11 DIAGNOSIS — F609 Personality disorder, unspecified: Secondary | ICD-10-CM | POA: Diagnosis not present

## 2019-02-15 DIAGNOSIS — Z20822 Contact with and (suspected) exposure to covid-19: Secondary | ICD-10-CM | POA: Diagnosis not present

## 2019-03-01 ENCOUNTER — Encounter: Payer: Self-pay | Admitting: Family Medicine

## 2019-03-01 ENCOUNTER — Ambulatory Visit (INDEPENDENT_AMBULATORY_CARE_PROVIDER_SITE_OTHER): Payer: Medicare Other | Admitting: Family Medicine

## 2019-03-01 ENCOUNTER — Other Ambulatory Visit: Payer: Self-pay

## 2019-03-01 VITALS — BP 100/72 | HR 67 | Temp 98.6°F | Resp 20

## 2019-03-01 DIAGNOSIS — J310 Chronic rhinitis: Secondary | ICD-10-CM | POA: Diagnosis not present

## 2019-03-01 DIAGNOSIS — F39 Unspecified mood [affective] disorder: Secondary | ICD-10-CM

## 2019-03-01 DIAGNOSIS — H6983 Other specified disorders of Eustachian tube, bilateral: Secondary | ICD-10-CM | POA: Diagnosis not present

## 2019-03-01 DIAGNOSIS — J309 Allergic rhinitis, unspecified: Secondary | ICD-10-CM | POA: Diagnosis not present

## 2019-03-01 MED ORDER — PAZEO 0.7 % OP SOLN: 1.0000 [drp] | Freq: Every day | OPHTHALMIC | 5 refills | Status: DC | PRN

## 2019-03-01 NOTE — Patient Instructions (Signed)
Mixed rhinitis Continue cetirizine 10 mg once a day as needed for runny nose Continue the nasal Flonase 2 sprays in each nostril once a day as needed for a stuffy nose. Right nostril point the applicator toward the right ear. Left nostril point the applicator toward the left ear Continue montelukast 10 mg once a day Continue azelastine nasal spray 2 sprays twice a day as needed for a runny nose or sneezing Continue saline nasal rinses twice a day as needed  Allergic conjunctivitis Continue Pazeo eye drops one drop in each eye once a day as needed for red, itchy eyes  Call me if this treatment plan is not working well for you  Follow up in 6 months or sooner if needed

## 2019-03-01 NOTE — Progress Notes (Signed)
[address] [place] [address] Dept: [phone]  FOLLOW UP NOTE  Patient ID: Sonya Moran, female    DOB: [birth year]  Age: 44 y.o. MRN: [id number] Date of Office Visit: 03/01/2019  Assessment  Chief Complaint: Allergic Rhinitis   HPI Sonya Moran is a 44 year old female who presents to the clinic for follow-up visit.  She is accompanied by her caregiver who assists with history.  Today's visit, [name] reports that her allergic rhinitis has been moderately well controlled with nasal congestion is the main symptom and occasional clear rhinorrhea.  She continues cetirizine 10 mg once a day, Flonase 2 sprays every afternoon, montelukast 10 mg once a day, saline rinses twice a day, and azelastine in the morning and in the evening on a regular schedule.  She reports allergic conjunctivitis is well controlled with Pazeo as needed.  Her current medications are listed in the chart.   Drug Allergies:  Allergies  Allergen Reactions  . Grifulvin V [Griseofulvin] Anaphylaxis and Other (See Comments)    Arm swelling    Physical Exam: BP 100/72   Pulse 67   Temp 98.6 F (37 C) (Tympanic)   Resp 20   SpO2 99%    Physical Exam Vitals reviewed.  Constitutional:      Appearance: Normal appearance.  HENT:     Head: Normocephalic and atraumatic.     Right Ear: Tympanic membrane normal.     Left Ear: Tympanic membrane normal.     Nose:     Comments: Bilateral nares erythematous with clear nasal drainage noted.  Pharynx normal.  Ears normal.  Eyes normal.    Mouth/Throat:     Pharynx: Oropharynx is clear.  Eyes:     Conjunctiva/sclera: Conjunctivae normal.  Cardiovascular:     Rate and Rhythm: Normal rate and regular rhythm.     Heart sounds: Normal heart sounds. No murmur.  Pulmonary:     Effort: Pulmonary effort is normal.     Breath sounds: Normal breath sounds.     Comments: Lungs clear to auscultation Musculoskeletal:        General: Normal range of motion.     Cervical  back: Normal range of motion and neck supple.  Skin:    General: Skin is warm and dry.  Neurological:     Mental Status: She is alert and oriented to person, place, and time.  Psychiatric:        Mood and Affect: Mood normal.        Behavior: Behavior normal.        Thought Content: Thought content normal.        Judgment: Judgment normal.     Assessment and Plan: 1. Mixed rhinitis   2. Eustachian tube dysfunction, bilateral   3. Allergic rhinitis, unspecified seasonality, unspecified trigger   4. Mood disorder ([place])     Meds ordered this encounter  Medications  . Olopatadine HCl (PAZEO) 0.7 % SOLN    Sig: Place 1 drop into both eyes daily as needed.    Dispense:  2.5 mL    Refill:  5    Patient Instructions  Mixed rhinitis Continue cetirizine 10 mg once a day as needed for runny nose Continue the nasal Flonase 2 sprays in each nostril once a day as needed for a stuffy nose. Right nostril point the applicator toward the right ear. Left nostril point the applicator toward the left ear Continue montelukast 10 mg once a day Continue azelastine nasal spray 2  sprays twice a day as needed for a runny nose or sneezing Continue saline nasal rinses twice a day as needed  Allergic conjunctivitis Continue Pazeo eye drops one drop in each eye once a day as needed for red, itchy eyes  Call me if this treatment plan is not working well for you  Follow up in 6 months or sooner if needed    Return in about 6 months (around 08/29/2019), or if symptoms worsen or fail to improve.    Thank you for the opportunity to care for this patient.  Please do not hesitate to contact me with questions.  Penne Lash, M.D.  [facility] [address] [place], [place] [address] [phone]

## 2019-03-08 ENCOUNTER — Encounter (HOSPITAL_BASED_OUTPATIENT_CLINIC_OR_DEPARTMENT_OTHER): Payer: Self-pay | Admitting: *Deleted

## 2019-03-08 ENCOUNTER — Other Ambulatory Visit: Payer: Self-pay

## 2019-03-08 ENCOUNTER — Emergency Department (HOSPITAL_BASED_OUTPATIENT_CLINIC_OR_DEPARTMENT_OTHER)
Admission: EM | Admit: 2019-03-08 | Discharge: 2019-03-08 | Disposition: A | Discharge status: Home / Self Care | Payer: Medicare Other | Attending: Emergency Medicine | Admitting: Emergency Medicine

## 2019-03-08 DIAGNOSIS — R300 Dysuria: Secondary | ICD-10-CM | POA: Diagnosis not present

## 2019-03-08 DIAGNOSIS — N3000 Acute cystitis without hematuria: Secondary | ICD-10-CM | POA: Insufficient documentation

## 2019-03-08 DIAGNOSIS — F79 Unspecified intellectual disabilities: Secondary | ICD-10-CM | POA: Diagnosis not present

## 2019-03-08 DIAGNOSIS — Z888 Allergy status to other drugs, medicaments and biological substances status: Secondary | ICD-10-CM | POA: Diagnosis not present

## 2019-03-08 DIAGNOSIS — Z79899 Other long term (current) drug therapy: Secondary | ICD-10-CM | POA: Insufficient documentation

## 2019-03-08 LAB — URINALYSIS, ROUTINE W REFLEX MICROSCOPIC
Bilirubin Urine: NEGATIVE
Glucose, UA: NEGATIVE mg/dL
Hgb urine dipstick: NEGATIVE
Ketones, ur: NEGATIVE mg/dL
Nitrite: NEGATIVE
Protein, ur: NEGATIVE mg/dL
Specific Gravity, Urine: 1.01 (ref 1.005–1.030)
pH: 7 (ref 5.0–8.0)

## 2019-03-08 LAB — URINALYSIS, MICROSCOPIC (REFLEX)

## 2019-03-08 MED ORDER — CEPHALEXIN 500 MG PO CAPS: 500.0000 mg | ORAL_CAPSULE | Freq: Two times a day (BID) | ORAL | 0 refills | Status: AC

## 2019-03-08 MED ORDER — PHENAZOPYRIDINE HCL 100 MG PO TABS: 100.0000 mg | ORAL_TABLET | Freq: Three times a day (TID) | ORAL | 0 refills | Status: AC | PRN

## 2019-03-08 MED FILL — CEPHALEXIN 500 MG CAPSULE: 500 | 7 days supply | Qty: 14 | Fill #0

## 2019-03-08 NOTE — ED Triage Notes (Signed)
Dysuria

## 2019-03-08 NOTE — ED Provider Notes (Signed)
[facility] EMERGENCY DEPARTMENT Provider Note   CSN: [id number] Arrival date & time: 03/08/19  1242     History Chief Complaint  Patient presents with  . Dysuria    Sonya Moran is a 44 y.o. female.  Who presents with a 3-day history of bilateral flank pain and dysuria.  Patient caregiver provides the majority of the history.  States that the patient has complained of pain with urination without hematuria.  She denies any systemic signs and symptoms of infection.  She states her appetite is normal and she has normal bowel movements daily.  Patient's caregiver states that she frequently gets urinary tract infections.  Patient denies any other symptoms at this time.  HPI       Past Medical History:  Diagnosis Date  . Depressive disorder   . Mental retardation   . Post traumatic stress disorder   . Psychotic disorder [facility])     Patient Active Problem List   Diagnosis Date Noted  . Allergic rhinitis 03/01/2019  . Mood disorder ([place]) 02/26/2018  . Acute sinusitis 12/26/2017  . Mixed rhinitis 08/01/2017  . Ear pain, bilateral 08/01/2017  . Eustachian tube dysfunction, bilateral 08/01/2017  . Plantar fasciitis of right foot 03/28/2015  . Metatarsal deformity 03/28/2015  . Calcaneal spur 03/28/2015  . Anemia 07/23/2013  . Onychomycosis 06/24/2012    Past Surgical History:  Procedure Laterality Date  . BACK SURGERY    . WRIST SURGERY       OB History   No obstetric history on file.     No family history on file.  Social History   Tobacco Use  . Smoking status: Never Smoker  . Smokeless tobacco: Never Used  Substance Use Topics  . Alcohol use: No    Alcohol/week: 0.0 standard drinks  . Drug use: No    Home Medications Prior to Admission medications   Medication Sig Start Date End Date Taking? Authorizing Provider  amitriptyline (ELAVIL) 100 MG tablet Take 100 mg by mouth at bedtime.  06/11/18   [provider]  Azelastine HCl 0.15 % SOLN  2 SPRAYS EACH NOSTRIL TWICE DAILY. 10/09/18   Dara Hoyer, FNP  Calcium Carbonate-Vitamin D (CALCIUM 500/D PO) Take by mouth daily.    [provider]  celecoxib (CELEBREX) 100 MG capsule Take 100 mg by mouth daily.     [provider]  cephALEXin (KEFLEX) 500 MG capsule Take 1 capsule (500 mg total) by mouth 2 (two) times daily for 7 days. 03/08/19 03/15/19  Marianna Payment, MD  cetirizine (ZYRTEC) 10 MG tablet TAKE (1) TABLET BY MOUTH ONCE DAILY. 10/02/18   Dara Hoyer, FNP  clotrimazole (LOTRIMIN) 1 % cream Apply to affected area 2 times daily 07/08/18   Molpus, Jenny Reichmann, MD  dexlansoprazole (DEXILANT) 60 MG capsule Take 60 mg by mouth daily.    [provider]  fenofibrate (TRICOR) 145 MG tablet Take 145 mg by mouth at bedtime.     [provider]  fluticasone (FLONASE) 50 MCG/ACT nasal spray 2 SPRAYS INTO BOTH NOSTRILS ONCE A DAY AS NEEDED FOR STUFFY NOSE 10/09/18   Ambs, Kathrine Cords, FNP  lamoTRIgine (LAMICTAL) 200 MG tablet  06/16/18   [provider]  levothyroxine (SYNTHROID) 25 MCG tablet Take 25 mcg by mouth daily.  06/11/18   [provider]  LINZESS 145 MCG CAPS capsule Take 145 mcg by mouth daily. 02/08/19   [provider]  lithium carbonate (ESKALITH) 450 MG CR tablet  Take 450 mg by mouth 2 (two) times daily.     [provider]  LORazepam (ATIVAN) 1 MG tablet Take 1 mg by mouth 2 (two) times daily as needed for anxiety.    [provider]  medroxyPROGESTERone (DEPO-PROVERA) 150 MG/ML injection  01/06/19   [provider]  mirabegron ER (MYRBETRIQ) 25 MG TB24 tablet Take 25 mg by mouth daily.    [provider]  montelukast (SINGULAIR) 10 MG tablet TAKE ONE TABLET BY MOUTH EACH EVENING TO PREVENT COUGH OR WHEEZE Patient taking differently: Take 10 mg by mouth at bedtime. TAKE ONE TABLET BY MOUTH EACH EVENING TO PREVENT COUGH OR WHEEZE 02/07/17   Valentina Shaggy, MD  OLANZapine (ZYPREXA) 10 MG tablet  Take 10 mg by mouth daily after breakfast. 02/08/19   [provider]  OLANZapine (ZYPREXA) 15 MG tablet Take 15 mg by mouth at bedtime. 02/08/19   [provider]  Olopatadine HCl (PAZEO) 0.7 % SOLN Place 1 drop into both eyes daily as needed. 03/01/19   Ambs, Kathrine Cords, FNP  phenazopyridine (PYRIDIUM) 100 MG tablet Take 1 tablet (100 mg total) by mouth 3 (three) times daily as needed for up to 2 days for pain. 03/08/19 03/10/19  Marianna Payment, MD  Probiotic Product (RESTORA PO) Take by mouth daily.    [provider]  propranolol (INDERAL) 40 MG tablet Take 40 mg by mouth 2 (two) times daily. 02/15/19   [provider]  SALINE MIST 0.65 % nasal spray 2 SPRAYS EACH NOSTRIL TWICE DAILY. 02/10/17   Valentina Shaggy, MD    Allergies    Grifulvin v [griseofulvin]  Review of Systems   Review of Systems - All review of systems are negative except what is noted on the HPI.   Physical Exam Updated Vital Signs BP 106/70   Pulse 64   Temp 98.3 F (36.8 C) (Oral)   Resp 20   Wt 76.3 kg   SpO2 100%   BMI 35.77 kg/m   Physical Exam Constitutional:      Appearance: Normal appearance.  HENT:     Head: Normocephalic and atraumatic.  Eyes:     Extraocular Movements: Extraocular movements intact.  Cardiovascular:     Rate and Rhythm: Normal rate.     Pulses: Normal pulses.  Pulmonary:     Effort: Pulmonary effort is normal. No respiratory distress.  Chest:     Chest wall: No tenderness.  Abdominal:     General: There is no distension.     Tenderness: There is no abdominal tenderness. There is no right CVA tenderness or left CVA tenderness.  Musculoskeletal:        General: No swelling. Normal range of motion.  Skin:    General: Skin is warm and dry.  Neurological:     General: No focal deficit present.     Mental Status: She is alert.  Psychiatric:        Mood and Affect: Mood normal.        Behavior: Behavior normal.     ED Results / Procedures  / Treatments   Labs (all labs ordered are listed, but only abnormal results are displayed) Labs Reviewed  URINALYSIS, ROUTINE W REFLEX MICROSCOPIC - Abnormal; Notable for the following components:      Result Value   APPearance HAZY (*)    Leukocytes,Ua LARGE (*)    All other components within normal limits  URINALYSIS, MICROSCOPIC (REFLEX) - Abnormal; Notable for the following  components:   Bacteria, UA FEW (*)    All other components within normal limits  URINE CULTURE    EKG None  Radiology No results found.  Procedures Procedures (including critical care time)  Medications Ordered in ED Medications - No data to display  ED Course  I have reviewed the triage vital signs and the nursing notes.  Pertinent labs & imaging results that were available during my care of the patient were reviewed by me and considered in my medical decision making (see chart for details).    MDM Rules/Calculators/A&P                      Acute uncomplicated cystitis: Patient presents with signs symptoms concerning for urinary tract infection.  Urinary culture pending.  Previous culture data shows polymicrobial infections.  Therefore, I will prescribe her 7-day course of Keflex 500 mg twice daily.  Patient is abdominal pain concerning for spasms.  Therefore I will give her a 2-day course of Pyridium.  I counseled the patient on the importance of staying well-hydrated and following up with her primary care doctor. - Keflex 500 mg BID for 7 days - Pyridium 100 mg TID for 2 days   Final Clinical Impression(s) / ED Diagnoses Final diagnoses:  Acute cystitis without hematuria  Dysuria    Rx / DC Orders ED Discharge Orders         Ordered    cephALEXin (KEFLEX) 500 MG capsule  2 times daily     03/08/19 1405    phenazopyridine (PYRIDIUM) 100 MG tablet  3 times daily PRN     03/08/19 1405           Marianna Payment, MD 03/08/19 1411    Rex Kras, Wenda Overland, MD 03/09/19 [phone]

## 2019-03-08 NOTE — ED Notes (Signed)
Resident at bedside.  

## 2019-03-08 NOTE — Discharge Instructions (Addendum)
Thank you for letting us participate in her care.  Today we discussed pain with urination.  Please see instructions below  Instructions: 1 I prescribed Keflex 500 mg.  Please take this twice daily for 7 days. 2.  Also give you a 2-day course of Pyridium 100 mg.  Please take this medication as needed 3 times a day for 2 days. 3.  Please stay well-hydrated and follow-up with your primary care doctor.

## 2019-03-09 LAB — URINE CULTURE

## 2019-03-18 DIAGNOSIS — N390 Urinary tract infection, site not specified: Secondary | ICD-10-CM | POA: Diagnosis not present

## 2019-04-08 DIAGNOSIS — L409 Psoriasis, unspecified: Secondary | ICD-10-CM | POA: Diagnosis not present

## 2019-04-08 DIAGNOSIS — E039 Hypothyroidism, unspecified: Secondary | ICD-10-CM | POA: Diagnosis not present

## 2019-04-08 DIAGNOSIS — N390 Urinary tract infection, site not specified: Secondary | ICD-10-CM | POA: Diagnosis not present

## 2019-04-08 DIAGNOSIS — F79 Unspecified intellectual disabilities: Secondary | ICD-10-CM | POA: Diagnosis not present

## 2019-04-12 ENCOUNTER — Other Ambulatory Visit: Payer: Self-pay | Admitting: Family Medicine

## 2019-04-23 DIAGNOSIS — R21 Rash and other nonspecific skin eruption: Secondary | ICD-10-CM | POA: Diagnosis not present

## 2019-04-30 DIAGNOSIS — B359 Dermatophytosis, unspecified: Secondary | ICD-10-CM | POA: Diagnosis not present

## 2019-05-03 DIAGNOSIS — B354 Tinea corporis: Secondary | ICD-10-CM | POA: Diagnosis not present

## 2019-05-03 DIAGNOSIS — L218 Other seborrheic dermatitis: Secondary | ICD-10-CM | POA: Diagnosis not present

## 2019-06-02 DIAGNOSIS — F4489 Other dissociative and conversion disorders: Secondary | ICD-10-CM | POA: Diagnosis not present

## 2019-06-02 DIAGNOSIS — R531 Weakness: Secondary | ICD-10-CM | POA: Diagnosis not present

## 2019-06-04 ENCOUNTER — Emergency Department (HOSPITAL_COMMUNITY): Payer: Medicare Other

## 2019-06-04 ENCOUNTER — Other Ambulatory Visit: Payer: Self-pay

## 2019-06-04 ENCOUNTER — Inpatient Hospital Stay (HOSPITAL_COMMUNITY)
Admission: EM | Admit: 2019-06-04 | Discharge: 2019-06-10 | DRG: 918 | Disposition: A | Discharge status: Home / Self Care | Payer: Medicare Other | Attending: Internal Medicine | Admitting: Internal Medicine

## 2019-06-04 ENCOUNTER — Encounter (HOSPITAL_COMMUNITY): Payer: Self-pay | Admitting: Internal Medicine

## 2019-06-04 DIAGNOSIS — F39 Unspecified mood [affective] disorder: Secondary | ICD-10-CM | POA: Diagnosis present

## 2019-06-04 DIAGNOSIS — T43591A Poisoning by other antipsychotics and neuroleptics, accidental (unintentional), initial encounter: Secondary | ICD-10-CM | POA: Diagnosis not present

## 2019-06-04 DIAGNOSIS — Z20822 Contact with and (suspected) exposure to covid-19: Secondary | ICD-10-CM | POA: Diagnosis present

## 2019-06-04 DIAGNOSIS — S01512A Laceration without foreign body of oral cavity, initial encounter: Secondary | ICD-10-CM | POA: Diagnosis present

## 2019-06-04 DIAGNOSIS — F4310 Post-traumatic stress disorder, unspecified: Secondary | ICD-10-CM | POA: Diagnosis present

## 2019-06-04 DIAGNOSIS — R410 Disorientation, unspecified: Secondary | ICD-10-CM | POA: Diagnosis present

## 2019-06-04 DIAGNOSIS — F329 Major depressive disorder, single episode, unspecified: Secondary | ICD-10-CM | POA: Diagnosis present

## 2019-06-04 DIAGNOSIS — F250 Schizoaffective disorder, bipolar type: Secondary | ICD-10-CM | POA: Diagnosis not present

## 2019-06-04 DIAGNOSIS — T56891A Toxic effect of other metals, accidental (unintentional), initial encounter: Secondary | ICD-10-CM | POA: Diagnosis not present

## 2019-06-04 DIAGNOSIS — J9811 Atelectasis: Secondary | ICD-10-CM | POA: Diagnosis not present

## 2019-06-04 DIAGNOSIS — K219 Gastro-esophageal reflux disease without esophagitis: Secondary | ICD-10-CM | POA: Diagnosis present

## 2019-06-04 DIAGNOSIS — Z79899 Other long term (current) drug therapy: Secondary | ICD-10-CM | POA: Diagnosis not present

## 2019-06-04 DIAGNOSIS — X58XXXA Exposure to other specified factors, initial encounter: Secondary | ICD-10-CM | POA: Diagnosis present

## 2019-06-04 DIAGNOSIS — Z03818 Encounter for observation for suspected exposure to other biological agents ruled out: Secondary | ICD-10-CM | POA: Diagnosis not present

## 2019-06-04 DIAGNOSIS — E039 Hypothyroidism, unspecified: Secondary | ICD-10-CM | POA: Diagnosis present

## 2019-06-04 DIAGNOSIS — F79 Unspecified intellectual disabilities: Secondary | ICD-10-CM | POA: Diagnosis present

## 2019-06-04 DIAGNOSIS — N179 Acute kidney failure, unspecified: Secondary | ICD-10-CM | POA: Diagnosis present

## 2019-06-04 DIAGNOSIS — R4182 Altered mental status, unspecified: Secondary | ICD-10-CM | POA: Diagnosis not present

## 2019-06-04 DIAGNOSIS — N183 Chronic kidney disease, stage 3 unspecified: Secondary | ICD-10-CM | POA: Diagnosis present

## 2019-06-04 LAB — URINALYSIS, ROUTINE W REFLEX MICROSCOPIC
Bilirubin Urine: NEGATIVE
Glucose, UA: NEGATIVE mg/dL
Hgb urine dipstick: NEGATIVE
Ketones, ur: NEGATIVE mg/dL
Nitrite: NEGATIVE
Protein, ur: NEGATIVE mg/dL
Specific Gravity, Urine: 1.01 (ref 1.005–1.030)
pH: 6 (ref 5.0–8.0)

## 2019-06-04 LAB — COMPREHENSIVE METABOLIC PANEL
ALT: 26 U/L (ref 0–44)
AST: 31 U/L (ref 15–41)
Albumin: 3.9 g/dL (ref 3.5–5.0)
Alkaline Phosphatase: 108 U/L (ref 38–126)
Anion gap: 8 (ref 5–15)
BUN: 24 mg/dL — ABNORMAL HIGH (ref 6–20)
CO2: 21 mmol/L — ABNORMAL LOW (ref 22–32)
Calcium: 10.3 mg/dL (ref 8.9–10.3)
Chloride: 105 mmol/L (ref 98–111)
Creatinine, Ser: 2.16 mg/dL — ABNORMAL HIGH (ref 0.44–1.00)
GFR calc Af Amer: 31 mL/min — ABNORMAL LOW (ref >60)
GFR calc non Af Amer: 27 mL/min — ABNORMAL LOW (ref >60)
Glucose, Bld: 110 mg/dL — ABNORMAL HIGH (ref 70–99)
Potassium: 4.2 mmol/L (ref 3.5–5.1)
Sodium: 134 mmol/L — ABNORMAL LOW (ref 135–145)
Total Bilirubin: 1.2 mg/dL (ref 0.3–1.2)
Total Protein: 6.8 g/dL (ref 6.5–8.1)

## 2019-06-04 LAB — I-STAT BETA HCG BLOOD, ED (MC, WL, AP ONLY): I-stat hCG, quantitative: <5 m[IU]/mL (ref <5)

## 2019-06-04 LAB — CBC WITH DIFFERENTIAL/PLATELET
Abs Immature Granulocytes: 0.02 10*3/uL (ref 0.00–0.07)
Basophils Absolute: 0 10*3/uL (ref 0.0–0.1)
Basophils Relative: 1 %
Eosinophils Absolute: 0.3 10*3/uL (ref 0.0–0.5)
Eosinophils Relative: 3 %
HCT: 36.8 % (ref 36.0–46.0)
Hemoglobin: 11.5 g/dL — ABNORMAL LOW (ref 12.0–15.0)
Immature Granulocytes: 0 %
Lymphocytes Relative: 14 %
Lymphs Abs: 1.2 10*3/uL (ref 0.7–4.0)
MCH: 30.3 pg (ref 26.0–34.0)
MCHC: 31.3 g/dL (ref 30.0–36.0)
MCV: 96.8 fL (ref 80.0–100.0)
Monocytes Absolute: 0.7 10*3/uL (ref 0.1–1.0)
Monocytes Relative: 8 %
Neutro Abs: 6.4 10*3/uL (ref 1.7–7.7)
Neutrophils Relative %: 74 %
Platelets: 137 10*3/uL — ABNORMAL LOW (ref 150–400)
RBC: 3.8 MIL/uL — ABNORMAL LOW (ref 3.87–5.11)
RDW: 13.1 % (ref 11.5–15.5)
WBC: 8.5 10*3/uL (ref 4.0–10.5)
nRBC: 0 % (ref 0.0–0.2)

## 2019-06-04 LAB — CBG MONITORING, ED: Glucose-Capillary: 94 mg/dL (ref 70–99)

## 2019-06-04 LAB — SARS CORONAVIRUS 2 BY RT PCR (HOSPITAL ORDER, PERFORMED IN ~~LOC~~ HOSPITAL LAB): SARS Coronavirus 2: NEGATIVE

## 2019-06-04 LAB — HIV ANTIBODY (ROUTINE TESTING W REFLEX): HIV Screen 4th Generation wRfx: NONREACTIVE

## 2019-06-04 LAB — TSH: TSH: 2.858 u[IU]/mL (ref 0.350–4.500)

## 2019-06-04 LAB — LITHIUM LEVEL: Lithium Lvl: 1.94 mmol/L (ref 0.60–1.20)

## 2019-06-04 LAB — MAGNESIUM: Magnesium: 2.3 mg/dL (ref 1.7–2.4)

## 2019-06-04 MED ORDER — SODIUM CHLORIDE 0.9 % IV BOLUS: 2000.0000 mL | Freq: Once | INTRAVENOUS | Status: DC

## 2019-06-04 MED ORDER — SODIUM CHLORIDE 0.9 % IV SOLN: INTRAVENOUS | Status: DC

## 2019-06-04 MED ORDER — ACETAMINOPHEN 325 MG PO TABS
650.0000 mg | ORAL_TABLET | Freq: Four times a day (QID) | ORAL | Status: DC | PRN
  Administered 2019-06-05: 650 mg via ORAL
  Filled 2019-06-04: qty 2

## 2019-06-04 MED ORDER — ENOXAPARIN SODIUM 30 MG/0.3ML ~~LOC~~ SOLN
30.0000 mg | SUBCUTANEOUS | Status: DC
  Administered 2019-06-04 – 2019-06-08 (×5): 30 mg via SUBCUTANEOUS
  Filled 2019-06-04 (×5): qty 0.3

## 2019-06-04 MED ORDER — ONDANSETRON HCL 4 MG PO TABS
4.0000 mg | ORAL_TABLET | Freq: Four times a day (QID) | ORAL | Status: DC | PRN
  Administered 2019-06-09: 4 mg via ORAL
  Filled 2019-06-04: qty 1

## 2019-06-04 MED ORDER — ONDANSETRON HCL 4 MG/2ML IJ SOLN
4.0000 mg | Freq: Four times a day (QID) | INTRAMUSCULAR | Status: DC | PRN
  Filled 2019-06-04: qty 2

## 2019-06-04 MED ORDER — ACETAMINOPHEN 650 MG RE SUPP: 650.0000 mg | Freq: Four times a day (QID) | RECTAL | Status: DC | PRN

## 2019-06-04 MED ORDER — SODIUM CHLORIDE 0.9 % IV BOLUS
1000.0000 mL | Freq: Once | INTRAVENOUS | Status: AC
  Administered 2019-06-04: 1000 mL via INTRAVENOUS

## 2019-06-04 NOTE — ED Notes (Signed)
Pt transported to CT ?

## 2019-06-04 NOTE — ED Notes (Signed)
Guardian cell number [phone]

## 2019-06-04 NOTE — ED Triage Notes (Signed)
Pt arrived POV from group home, c/o increased confusion, weakness, slurred speech, elevated lithium. Last took lithium Wednesday AM. Mouth injury noted, hx IDD. Pt tearful.

## 2019-06-04 NOTE — H&P (Signed)
History and Physical   Sonya Moran [id number] DOB: 1975-09-01 DOA: 06/04/2019  Referring MD/NP/PA: Dr. Ronnald Nian  PCP: Lucianne Lei, MD   Patient coming from: Group home  Chief Complaint: Altered mental status  HPI: Sonya Moran is a 44 y.o. female with medical history significant of intellectual disability, PTSD, mental retardation with psychotic disorders who was brought in from a group home secondary to worsening altered mental status.  Patient was noted to have slurred speech weakness and confusion which has been going on gradually over the last few weeks.  She was seen by PCP on May 19.  Lithium level was collected and sent.  It came back yesterday is 3.0.  Caregiver was informed this morning to bring patient to the ER.  Patient arrive ER and was found to have lithium level of 1.9 with acute kidney injury.  She is still confused and not at baseline.  Patient is unable to write her name or dress herself.  She was able to interact before and communicate but not anymore.  No fever or chills no nausea vomiting with diarrhea.  Patient is being admitted therefore for evaluation of and treatment of her lithium toxicity..  ED Course: Temperature 97.4 blood pressure 97/53 pulse 76 respiratory rate of 22 oxygen sat 95% on room air.  Lithium level of 194.  TSH 2.850.  Hemoglobin 11.5.  Sodium 134 potassium 4.2 chloride 105 CO2 21 with glucose 110.  BUN is 24 and creatinine 2.16.  Creatinine was 0.93 in February.  Magnesium 2.3.  The rest of the chemistry appeared to be within normal.  Chest x-ray and head CT without contrast are both within normal.  Patient being admitted for symptomatic management of lithium toxicity.  Poison control has been contacted.  Review of Systems: As per HPI otherwise 10 point review of systems negative.    Past Medical History:  Diagnosis Date  . Depressive disorder   . Mental retardation   . Post traumatic stress disorder   . Psychotic disorder [facility])     Past  Surgical History:  Procedure Laterality Date  . BACK SURGERY    . WRIST SURGERY       reports that she has never smoked. She has never used smokeless tobacco. She reports that she does not drink alcohol or use drugs.  Allergies  Allergen Reactions  . Grifulvin V [Griseofulvin] Anaphylaxis and Other (See Comments)    Arm swelling    History reviewed. No pertinent family history.   Prior to Admission medications   Medication Sig Start Date End Date Taking? Authorizing Provider  amitriptyline (ELAVIL) 100 MG tablet Take 100 mg by mouth at bedtime.  06/11/18  Yes [provider]  Azelastine HCl 0.15 % SOLN 2 SPRAYS EACH NOSTRIL TWICE DAILY. Patient taking differently: Place 2 sprays into the nose in the morning and at bedtime.  10/09/18  Yes Ambs, Kathrine Cords, FNP  celecoxib (CELEBREX) 100 MG capsule Take 100 mg by mouth daily.    Yes [provider]  cetirizine (ZYRTEC) 10 MG tablet TAKE (1) TABLET BY MOUTH ONCE DAILY. Patient taking differently: Take 10 mg by mouth daily.  04/12/19  Yes Ambs, Kathrine Cords, FNP  dexlansoprazole (DEXILANT) 60 MG capsule Take 60 mg by mouth daily.   Yes [provider]  fenofibrate (TRICOR) 145 MG tablet Take 145 mg by mouth at bedtime.    Yes [provider]  fluticasone (FLONASE) 50 MCG/ACT nasal spray 2 SPRAYS INTO BOTH NOSTRILS ONCE A  DAY AS NEEDED FOR STUFFY NOSE Patient taking differently: Place 2 sprays into both nostrils daily as needed for allergies or rhinitis. 2 SPRAYS INTO BOTH NOSTRILS ONCE A DAY AS NEEDED FOR STUFFY NOSE 10/09/18  Yes Ambs, Kathrine Cords, FNP  lamoTRIgine (LAMICTAL) 200 MG tablet Take 200 mg by mouth daily.  06/16/18  Yes [provider]  levothyroxine (SYNTHROID) 25 MCG tablet Take 25 mcg by mouth daily.  06/11/18  Yes [provider]  LINZESS 145 MCG CAPS capsule Take 145 mcg by mouth daily. 02/08/19  Yes [provider]  lithium carbonate (ESKALITH) 450 MG CR tablet Take 450 mg by mouth  2 (two) times daily.    Yes [provider]  LORazepam (ATIVAN) 1 MG tablet Take 1.5 mg by mouth daily as needed for anxiety.    Yes [provider]  mirabegron ER (MYRBETRIQ) 25 MG TB24 tablet Take 25 mg by mouth daily.   Yes [provider]  montelukast (SINGULAIR) 10 MG tablet TAKE ONE TABLET BY MOUTH EACH EVENING TO PREVENT COUGH OR WHEEZE Patient taking differently: Take 10 mg by mouth at bedtime.  02/07/17  Yes Valentina Shaggy, MD  OLANZapine (ZYPREXA) 10 MG tablet Take 10 mg by mouth daily after breakfast. 02/08/19  Yes [provider]  OLANZapine (ZYPREXA) 15 MG tablet Take 15 mg by mouth at bedtime. 02/08/19  Yes [provider]  Olopatadine HCl 0.2 % SOLN Place 1 drop into both eyes daily as needed (for itching).   Yes [provider]  propranolol (INDERAL) 40 MG tablet Take 40 mg by mouth 2 (two) times daily. 02/15/19  Yes [provider]  SALINE MIST 0.65 % nasal spray 2 SPRAYS EACH NOSTRIL TWICE DAILY. Patient taking differently: Place 2 sprays into the nose in the morning and at bedtime.  02/10/17  Yes Valentina Shaggy, MD  Calcium Carbonate-Vitamin D (CALCIUM 500/D PO) Take by mouth daily.    [provider]  Olopatadine HCl (PAZEO) 0.7 % SOLN Place 1 drop into both eyes daily as needed. Patient not taking: Reported on 06/04/2019 03/01/19   Dara Hoyer, FNP    Physical Exam: Vitals:   06/04/19 2130 06/04/19 2145 06/04/19 2200 06/04/19 2250  BP: 114/74 119/77 117/72 122/75  Pulse: 69 70 70 76  Resp: (!) 22 (!) 22 18 16   Temp:   97.8 F (36.6 C) (!) 97.4 F (36.3 C)  TempSrc:   Oral Oral  SpO2: 96% 100% 99% 100%  Weight:      Height:          Constitutional: Confused, disoriented Vitals:   06/04/19 2130 06/04/19 2145 06/04/19 2200 06/04/19 2250  BP: 114/74 119/77 117/72 122/75  Pulse: 69 70 70 76  Resp: (!) 22 (!) 22 18 16   Temp:   97.8 F (36.6 C) (!) 97.4 F (36.3 C)  TempSrc:   Oral  Oral  SpO2: 96% 100% 99% 100%  Weight:      Height:       Eyes: PERRL, lids and conjunctivae normal ENMT: Mucous membranes are moist. Posterior pharynx clear of any exudate or lesions.Normal dentition.  Neck: normal, supple, no masses, no thyromegaly Respiratory: clear to auscultation bilaterally, no wheezing, no crackles. Normal respiratory effort. No accessory muscle use.  Cardiovascular: Regular rate and rhythm, no murmurs / rubs / gallops. No extremity edema. 2+ pedal pulses. No carotid bruits.  Abdomen: no tenderness, no masses palpated. No hepatosplenomegaly. Bowel sounds positive.  Musculoskeletal: no clubbing /  cyanosis. No joint deformity upper and lower extremities. Good ROM, no contractures. Normal muscle tone.  Skin: no rashes, lesions, ulcers. No induration Neurologic: CN 2-12 grossly intact. Sensation intact, DTR normal. Strength 5/5 in all 4.  Psychiatric: Disoriented confuse but responding to voice and communicating, not following commands.   Labs on Admission: I have personally reviewed following labs and imaging studies  CBC: Recent Labs  Lab 06/04/19 1648  WBC 8.5  NEUTROABS 6.4  HGB 11.5*  HCT 36.8  MCV 96.8  PLT 0000000*   Basic Metabolic Panel: Recent Labs  Lab 06/04/19 1648  NA 134*  K 4.2  CL 105  CO2 21*  GLUCOSE 110*  BUN 24*  CREATININE 2.16*  CALCIUM 10.3  MG 2.3   GFR: Estimated Creatinine Clearance: 27.6 mL/min (A) (by C-G formula based on SCr of 2.16 mg/dL (H)). Liver Function Tests: Recent Labs  Lab 06/04/19 1648  AST 31  ALT 26  ALKPHOS 108  BILITOT 1.2  PROT 6.8  ALBUMIN 3.9   No results for input(s): LIPASE, AMYLASE in the last 168 hours. No results for input(s): AMMONIA in the last 168 hours. Coagulation Profile: No results for input(s): INR, PROTIME in the last 168 hours. Cardiac Enzymes: No results for input(s): CKTOTAL, CKMB, CKMBINDEX, TROPONINI in the last 168 hours. BNP (last 3 results) No results for input(s):  PROBNP in the last 8760 hours. HbA1C: No results for input(s): HGBA1C in the last 72 hours. CBG: Recent Labs  Lab 06/04/19 1653  GLUCAP 94   Lipid Profile: No results for input(s): CHOL, HDL, LDLCALC, TRIG, CHOLHDL, LDLDIRECT in the last 72 hours. Thyroid Function Tests: Recent Labs    06/04/19 1648  TSH 2.858   Anemia Panel: No results for input(s): VITAMINB12, FOLATE, FERRITIN, TIBC, IRON, RETICCTPCT in the last 72 hours. Urine analysis:    Component Value Date/Time   COLORURINE YELLOW 06/04/2019 1913   APPEARANCEUR CLEAR 06/04/2019 1913   LABSPEC 1.010 06/04/2019 1913   PHURINE 6.0 06/04/2019 1913   GLUCOSEU NEGATIVE 06/04/2019 1913   HGBUR NEGATIVE 06/04/2019 [place] 06/04/2019 [place] 06/04/2019 1913   PROTEINUR NEGATIVE 06/04/2019 1913   UROBILINOGEN 0.2 09/26/2010 1716   NITRITE NEGATIVE 06/04/2019 1913   LEUKOCYTESUR LARGE (A) 06/04/2019 1913   Sepsis Labs: @LABRCNTIP (procalcitonin:4,lacticidven:4) ) Recent Results (from the past 240 hour(s))  SARS Coronavirus 2 by RT PCR (hospital order, performed in [place] hospital lab) Nasopharyngeal Nasopharyngeal Swab     Status: None   Collection Time: 06/04/19  7:13 PM   Specimen: Nasopharyngeal Swab  Result Value Ref Range Status   SARS Coronavirus 2 NEGATIVE NEGATIVE Final    Comment: (NOTE) SARS-CoV-2 target nucleic acids are NOT DETECTED. The SARS-CoV-2 RNA is generally detectable in upper and lower respiratory specimens during the acute phase of infection. The lowest concentration of SARS-CoV-2 viral copies this assay can detect is 250 copies / mL. A negative result does not preclude SARS-CoV-2 infection and should not be used as the sole basis for treatment or other patient management decisions.  A negative result may occur with improper specimen collection / handling, submission of specimen other than nasopharyngeal swab, presence of viral mutation(s) within  the areas targeted by this assay, and inadequate number of viral copies (<250 copies / mL). A negative result must be combined with clinical observations, patient history, and epidemiological information. Fact Sheet for Patients:   StrictlyIdeas.no Fact Sheet for Healthcare Providers: BankingDealers.co.za This test is not yet  approved or cleared  by the [place] and has been authorized for detection and/or diagnosis of SARS-CoV-2 by FDA under an Emergency Use Authorization (EUA).  This EUA will remain in effect (meaning this test can be used) for the duration of the COVID-19 declaration under Section 564(b)(1) of the Act, 21 U.S.C. section 360bbb-3(b)(1), unless the authorization is terminated or revoked sooner. Performed at [facility], [place] [address], [place], [place] [address]      Radiological Exams on Admission: CT Head Wo Contrast  Result Date: 06/04/2019 CLINICAL DATA:  Altered mental status. EXAM: CT HEAD WITHOUT CONTRAST TECHNIQUE: Contiguous axial images were obtained from the base of the skull through the vertex without intravenous contrast. COMPARISON:  04/14/2008 FINDINGS: Brain: No evidence of acute infarction, hemorrhage, hydrocephalus, extra-axial collection or mass lesion/mass effect. Vascular: No hyperdense vessel or unexpected calcification. Skull: Normal. Negative for fracture or focal lesion. Sinuses/Orbits: No acute finding. Other: None. IMPRESSION: No acute intracranial pathology. Electronically Signed   By: Constance Holster M.D.   On: 06/04/2019 19:17   DG Chest Portable 1 View  Result Date: 06/04/2019 CLINICAL DATA:  Increasing weakness EXAM: PORTABLE CHEST 1 VIEW COMPARISON:  07/08/2018 FINDINGS: Cardiac shadow is stable. The lungs are well aerated bilaterally. Bibasilar atelectatic changes are noted. No sizable effusion is seen. No acute bony abnormality is noted. IMPRESSION: Bibasilar atelectasis.  Electronically Signed   By: Inez Catalina M.D.   On: 06/04/2019 17:21    EKG: Independently reviewed.  It shows sinus rhythm with prolonged QTC of 467.  No significant ST changes  Assessment/Plan Principal Problem:   Lithium toxicity Active Problems:   Mood disorder (HCC)   ARF (acute renal failure) (HCC)     #1 lithium toxicity: Accidental.  Currently off of lithium.  Monitor level until stabilized.  Symptoms management.  No indication for hemodialysis at this point despite mild acute kidney injury.  Follow recommendations by poison control  #2 mood disorder with psychosis: Patient is lithium is currently stopped.  Will request psychiatric involvement and counseling at some point.  #3 acute kidney injury: Most likely due to lithium toxicity.  High aggressive hydration.  If renal function worsens or not better nephrology consult for possible hemodialysis.  #4 intellectual disability: Chronic.  Monitor patient and treat as such.   DVT prophylaxis: Lovenox Code Status: Full code Family Communication: Caregiver Disposition Plan: Back to group home Consults called: Poison control Admission status: Inpatient  Severity of Illness: The appropriate patient status for this patient is INPATIENT. Inpatient status is judged to be reasonable and necessary in order to provide the required intensity of service to ensure the patient's safety. The patient's presenting symptoms, physical exam findings, and initial radiographic and laboratory data in the context of their chronic comorbidities is felt to place them at high risk for further clinical deterioration. Furthermore, it is not anticipated that the patient will be medically stable for discharge from the hospital within 2 midnights of admission. The following factors support the patient status of inpatient.   " The patient's presenting symptoms include altered mental status. " The worrisome physical exam findings include confusion. " The initial  radiographic and laboratory data are worrisome because of elevated lithium level. " The chronic co-morbidities include mental retardation with intellectual disability.   * I certify that at the point of admission it is my clinical judgment that the patient will require inpatient hospital care spanning beyond 2 midnights from the point of admission due to high  intensity of service, high risk for further deterioration and high frequency of surveillance required.Barbette Merino MD [facility] Pager [phone]  If 7PM-7AM, please contact night-coverage [email or web address] Password [facility]  06/04/2019, 11:19 PM

## 2019-06-04 NOTE — ED Provider Notes (Signed)
Medical screening examination/treatment/procedure(s) were conducted as a shared visit with non-physician practitioner(s) and myself.  I personally evaluated the patient during the encounter. Briefly, the patient is a 44 y.o. female psychotic disorder, PTSD, intellectual disability disorder who presents to the ED with altered mental status.  Patient with unremarkable vitals.  No fever.  Patient here with caregiver who states that they were told to come to the emergency department as patient had elevated lithium level.  This was checked 2 days ago by primary care doctor.  Lithium level was 3.  Patient also had elevated kidney function at 2.5.  Patient was confused and they had lab work checked at primary care doctor 2 days ago.  They have held lithium since that time.  Patient a little bit confused per her caregiver.  Patient is able to follow commands.  She appears neurologically intact.  She appears generally weak on exam.  Does not appear that she is not far off from her baseline but difficult to tell.  Will recheck labs and start IV hydration with normal saline bolus.  Poison control has been contacted but awaiting repeat lithium levels and electrolyte and kidney function.  Anticipate admission for likely aggressive hydration to further remove lithium and reevaluate patient's mental status.  We will get a head CT to further rule out causes of altered mental status.  Have a lower concern for stroke given lithium level elevation.  Lithium level today is 1.94.  Creatinine is 2.16.  This appears to be improved from 2 days ago.  Poison control and toxicology recommend continue IV hydration.  No need for dialysis at this time.  She has not had any seizure-like activity throughout our care.  Awaiting CT scan of her head to rule out any intracranial process.  Low concern for stroke at this time given elevation of lithium.  Anticipate admission for further care.  If not completely back to baseline after hydration and  normalization of lithium level may need an MRI of the brain.  Hemodynamically stable throughout my care.   EKG Interpretation  Date/Time:  Friday Jun 04 2019 17:35:53 EDT Ventricular Rate:  64 PR Interval:    QRS Duration: 114 QT Interval:  452 QTC Calculation: 467 R Axis:   39 Text Interpretation: Sinus rhythm Prolonged PR interval Confirmed by Lennice Sites [phone] on 06/04/2019 5:51:22 PM           Lennice Sites, DO 06/04/19 1843

## 2019-06-04 NOTE — ED Provider Notes (Addendum)
[facility] EMERGENCY DEPARTMENT Provider Note   CSN: [id number] Arrival date & time: 06/04/19  1544     History Chief Complaint  Patient presents with  . Altered Mental Status    Sonya Moran is a 44 y.o. female.   HPI  LEVEL 5 CAVEAT 2/2 MENTAL STATUS  44 year old female with a history of PTSD, intellectual disability presents from her group home with gradual declining mental status over the last several weeks.  History provided by caregiver at bedside.  She reports that the patient has been having gradual confusion, weakness, slurred speech for the last several weeks.  She was seen by her PCP on 5/19 and they checked a lithium level and told to hold lithium.  Lithium resulted yesterday at a level of 3.0, but the caregiver found out about it this morning and for told by the PCP to come to the ED. She also had worsening renal function w/ a CR 2.38.  Caregiver reports that the patient is normally very interactive, is able to take care of herself for the most part.  She has had some periods of confusion and at times was unaware where she was.  She has not been able to write her name or dress herself.  Caregiver denies any vomiting, fevers, shortness of breath.  She does report that the patient does have a history of sleep apnea and uses a CPAP.  Patient is also taking Zyprexa along with her lithium as an antipsychotic, Ativan as needed.  She is also on levothyroxine, though I do not see a hypothyroidism diagnosis in her problem list.       Past Medical History:  Diagnosis Date  . Depressive disorder   . Mental retardation   . Post traumatic stress disorder   . Psychotic disorder [facility])     Patient Active Problem List   Diagnosis Date Noted  . Allergic rhinitis 03/01/2019  . Mood disorder ([place]) 02/26/2018  . Acute sinusitis 12/26/2017  . Mixed rhinitis 08/01/2017  . Ear pain, bilateral 08/01/2017  . Eustachian tube dysfunction, bilateral 08/01/2017  . Plantar  fasciitis of right foot 03/28/2015  . Metatarsal deformity 03/28/2015  . Calcaneal spur 03/28/2015  . Anemia 07/23/2013  . Onychomycosis 06/24/2012    Past Surgical History:  Procedure Laterality Date  . BACK SURGERY    . WRIST SURGERY       OB History   No obstetric history on file.     No family history on file.  Social History   Tobacco Use  . Smoking status: Never Smoker  . Smokeless tobacco: Never Used  Substance Use Topics  . Alcohol use: No    Alcohol/week: 0.0 standard drinks  . Drug use: No    Home Medications Prior to Admission medications   Medication Sig Start Date End Date Taking? Authorizing Provider  amitriptyline (ELAVIL) 100 MG tablet Take 100 mg by mouth at bedtime.  06/11/18   [provider]  Azelastine HCl 0.15 % SOLN 2 SPRAYS EACH NOSTRIL TWICE DAILY. 10/09/18   Dara Hoyer, FNP  Calcium Carbonate-Vitamin D (CALCIUM 500/D PO) Take by mouth daily.    [provider]  celecoxib (CELEBREX) 100 MG capsule Take 100 mg by mouth daily.     [provider]  cetirizine (ZYRTEC) 10 MG tablet TAKE (1) TABLET BY MOUTH ONCE DAILY. 04/12/19   Dara Hoyer, FNP  clotrimazole (LOTRIMIN) 1 % cream Apply to affected area 2 times daily 07/08/18  Molpus, John, MD  dexlansoprazole (DEXILANT) 60 MG capsule Take 60 mg by mouth daily.    [provider]  fenofibrate (TRICOR) 145 MG tablet Take 145 mg by mouth at bedtime.     [provider]  fluticasone (FLONASE) 50 MCG/ACT nasal spray 2 SPRAYS INTO BOTH NOSTRILS ONCE A DAY AS NEEDED FOR STUFFY NOSE 10/09/18   Ambs, Kathrine Cords, FNP  lamoTRIgine (LAMICTAL) 200 MG tablet  06/16/18   [provider]  levothyroxine (SYNTHROID) 25 MCG tablet Take 25 mcg by mouth daily.  06/11/18   [provider]  LINZESS 145 MCG CAPS capsule Take 145 mcg by mouth daily. 02/08/19   [provider]  lithium carbonate (ESKALITH) 450 MG CR tablet Take 450 mg by mouth 2 (two) times daily.      [provider]  LORazepam (ATIVAN) 1 MG tablet Take 1 mg by mouth 2 (two) times daily as needed for anxiety.    [provider]  medroxyPROGESTERone (DEPO-PROVERA) 150 MG/ML injection  01/06/19   [provider]  mirabegron ER (MYRBETRIQ) 25 MG TB24 tablet Take 25 mg by mouth daily.    [provider]  montelukast (SINGULAIR) 10 MG tablet TAKE ONE TABLET BY MOUTH EACH EVENING TO PREVENT COUGH OR WHEEZE Patient taking differently: Take 10 mg by mouth at bedtime. TAKE ONE TABLET BY MOUTH EACH EVENING TO PREVENT COUGH OR WHEEZE 02/07/17   Valentina Shaggy, MD  OLANZapine (ZYPREXA) 10 MG tablet Take 10 mg by mouth daily after breakfast. 02/08/19   [provider]  OLANZapine (ZYPREXA) 15 MG tablet Take 15 mg by mouth at bedtime. 02/08/19   [provider]  Olopatadine HCl (PAZEO) 0.7 % SOLN Place 1 drop into both eyes daily as needed. 03/01/19   Dara Hoyer, FNP  Probiotic Product (RESTORA PO) Take by mouth daily.    [provider]  propranolol (INDERAL) 40 MG tablet Take 40 mg by mouth 2 (two) times daily. 02/15/19   [provider]  SALINE MIST 0.65 % nasal spray 2 SPRAYS EACH NOSTRIL TWICE DAILY. 02/10/17   Valentina Shaggy, MD    Allergies    Grifulvin v [griseofulvin]  Review of Systems   Review of Systems  Constitutional: Negative for chills and fever.  HENT: Negative for ear pain and sore throat.   Eyes: Negative for pain and visual disturbance.  Respiratory: Negative for cough and shortness of breath.   Cardiovascular: Negative for chest pain and palpitations.  Gastrointestinal: Negative for abdominal pain, nausea and vomiting.  Genitourinary: Negative for dysuria and hematuria.  Musculoskeletal: Negative for arthralgias and back pain.  Skin: Negative for color change and rash.  Neurological: Negative for dizziness, seizures, syncope and numbness.  Psychiatric/Behavioral: Positive for confusion. The  patient is nervous/anxious.   All other systems reviewed and are negative.   Physical Exam Updated Vital Signs BP 116/71   Pulse 64   Temp 99.1 F (37.3 C) (Oral)   Resp 18   Ht 4\' 11"  (1.499 m)   Wt 71.7 kg   SpO2 99%   BMI 31.91 kg/m   Physical Exam Vitals and nursing note reviewed.  Constitutional:      General: She is in acute distress.     Appearance: She is well-developed. She is obese. She is ill-appearing. She is not diaphoretic.  HENT:     Head: Normocephalic and atraumatic.     Nose: Nose normal.     Mouth/Throat:  Mouth: Mucous membranes are moist.     Pharynx: No oropharyngeal exudate or posterior oropharyngeal erythema.     Comments: Patient with dry crusted blood around lips, evidence of bloody tongue.  Tongue normal size, uvula midline with equal rise.  No sublingual swelling, no tripoding, tolerating secretions well.  Eyes:     Conjunctiva/sclera: Conjunctivae normal.     Pupils: Pupils are equal, round, and reactive to light.  Cardiovascular:     Rate and Rhythm: Normal rate and regular rhythm.     Heart sounds: No murmur.  Pulmonary:     Effort: Pulmonary effort is normal. No respiratory distress.     Breath sounds: Normal breath sounds. No stridor.  Abdominal:     Palpations: Abdomen is soft.     Tenderness: There is no abdominal tenderness.  Musculoskeletal:        General: No swelling or tenderness. Normal range of motion.     Cervical back: Neck supple.  Skin:    General: Skin is warm and dry.     Capillary Refill: Capillary refill takes less than 2 seconds.     Findings: No bruising or erythema.  Neurological:     General: No focal deficit present.     Mental Status: She is alert. She is disoriented.     Sensory: No sensory deficit.     Motor: No weakness.     Comments: Neuro exam limited secondary to mental status, but she is able to follow basic commands.  Normal strength in upper and lower extremities, sensations and range of motion  intact.  Psychiatric:        Mood and Affect: Mood normal.     ED Results / Procedures / Treatments   Labs (all labs ordered are listed, but only abnormal results are displayed) Labs Reviewed  CBC WITH DIFFERENTIAL/PLATELET - Abnormal; Notable for the following components:      Result Value   RBC 3.80 (*)    Hemoglobin 11.5 (*)    Platelets 137 (*)    All other components within normal limits  COMPREHENSIVE METABOLIC PANEL - Abnormal; Notable for the following components:   Sodium 134 (*)    CO2 21 (*)    Glucose, Bld 110 (*)    BUN 24 (*)    Creatinine, Ser 2.16 (*)    GFR calc non Af Amer 27 (*)    GFR calc Af Amer 31 (*)    All other components within normal limits  LITHIUM LEVEL - Abnormal; Notable for the following components:   Lithium Lvl 1.94 (*)    All other components within normal limits  URINALYSIS, ROUTINE W REFLEX MICROSCOPIC - Abnormal; Notable for the following components:   Leukocytes,Ua LARGE (*)    Bacteria, UA FEW (*)    Non Squamous Epithelial 0-5 (*)    All other components within normal limits  SARS CORONAVIRUS 2 BY RT PCR (HOSPITAL ORDER, [place] [facility])  TSH  MAGNESIUM  I-STAT BETA HCG BLOOD, ED ([facility], [facility], [facility] ONLY)  CBG MONITORING, ED    EKG EKG Interpretation  Date/Time:  Friday Jun 04 2019 17:35:53 EDT Ventricular Rate:  64 PR Interval:    QRS Duration: 114 QT Interval:  452 QTC Calculation: 467 R Axis:   39 Text Interpretation: Sinus rhythm Prolonged PR interval Confirmed by Lennice Sites [phone]) on 06/04/2019 5:51:22 PM   Radiology CT Head Wo Contrast  Result Date: 06/04/2019 CLINICAL DATA:  Altered mental status. EXAM: CT  HEAD WITHOUT CONTRAST TECHNIQUE: Contiguous axial images were obtained from the base of the skull through the vertex without intravenous contrast. COMPARISON:  04/14/2008 FINDINGS: Brain: No evidence of acute infarction, hemorrhage, hydrocephalus, extra-axial collection or mass lesion/mass  effect. Vascular: No hyperdense vessel or unexpected calcification. Skull: Normal. Negative for fracture or focal lesion. Sinuses/Orbits: No acute finding. Other: None. IMPRESSION: No acute intracranial pathology. Electronically Signed   By: Constance Holster M.D.   On: 06/04/2019 19:17   DG Chest Portable 1 View  Result Date: 06/04/2019 CLINICAL DATA:  Increasing weakness EXAM: PORTABLE CHEST 1 VIEW COMPARISON:  07/08/2018 FINDINGS: Cardiac shadow is stable. The lungs are well aerated bilaterally. Bibasilar atelectatic changes are noted. No sizable effusion is seen. No acute bony abnormality is noted. IMPRESSION: Bibasilar atelectasis. Electronically Signed   By: Inez Catalina M.D.   On: 06/04/2019 17:21    Procedures .Critical Care Performed by: Garald Balding, PA-C Authorized by: Garald Balding, PA-C   Critical care provider statement:    Critical care time (minutes):  35   Critical care was necessary to treat or prevent imminent or life-threatening deterioration of the following conditions:  Toxidrome, renal failure and CNS failure or compromise   Critical care was time spent personally by me on the following activities:  Discussions with consultants, evaluation of patient's response to treatment, examination of patient, ordering and performing treatments and interventions, ordering and review of laboratory studies, ordering and review of radiographic studies, pulse oximetry, re-evaluation of patient's condition, obtaining history from patient or surrogate, review of old charts and development of treatment plan with patient or surrogate   (including critical care time)  Medications Ordered in ED Medications  sodium chloride 0.9 % bolus 1,000 mL (0 mLs Intravenous Stopped 06/04/19 1851)  sodium chloride 0.9 % bolus 1,000 mL (1,000 mLs Intravenous New Bag/Given 06/04/19 1852)    ED Course  I have reviewed the triage vital signs and the nursing notes.  Pertinent labs & imaging results  that were available during my care of the patient were reviewed by me and considered in my medical decision making (see chart for details).    MDM Rules/Calculators/A&P                     44 year old female with mental disability presents with altered mental status. Presentation to the ER, the patient is alert, though appears acutely in distress.  She is some mild stridor inspiration, appears to be lethargic.  Vitals on arrival 97/53, temp 99.1, heart rate 62 with respirations of 18 and O2 sats at 98%.  However vitals in the room show oxygen saturations between 89 and 90%.  Patient placed on 2 L nasal cannula.  She does not have any active at home oxygen requirement, though caregiver states that she uses a CPAP at home for sleep apnea.  Patient is able to follow commands and tell me her name but is unable to tell me the year or her location. No focal neuro deficits. No abdominal pain. She is awake though does have some slurred speech and slightly lethargic, weak on my exam.  Caregiver states that current state deviates from her baseline.  Spoke with poison control over the phone, will order basic labs, lithium, magnesium, EKG, UA, CBG, TSH.  Will also add on CT head and chest although concern for intracranial process low given known elevated lithium level.  Will begin aggressive rehydration starting with 1 L of fluid.  Strict I&O monitoring.  Anticipate admission for continued diuresis and possible need for dialysis.  Will monitor patient's mental status closely.  6:30 PM: Lithium level now 1.94. CBC without leukocytosis, normal hemoglobin. CMP without any significant abnormalities, normal calcium. BUN increased to 24 from 10 2 days ago. Creatinine mildly improved at 2.16, was 2.38 on 5/19. TSH normal. EKG without prolonged QTC. CBG 94. Chest x-ray without acute abnormality. On reexamination, patient's mental status appears to be mildly improved and she sounds more coherent in her speech. Adding on an  additional liter of fluids.  6:44 PM, spoke with poison control again, updated on new lab values. They indicate that there is no need for dialysis at this time as the patient seems to be already improving after 1 L of fluids. Recheck labs in 4 to 6 hours and continue to monitor for neuro changes/myoclonus, hyperreflexia. May require dialysis if symptoms worsen.  Monitor for 1 to 2 cc/kg/hr urine output as goal.  Patient receiving additional liter of fluids at this time. CT head pending. After CT will consult hospitalist for admission for further treatment/monitoring.  7:43 PM: CT head without acute abnormalities.  Spoke with Dr. Jonelle Sidle, patient will be admitted for further fluid resuscitation, urine output monitoring, rechecking labs/ EKG in 4 to 6 hours.  Patient is hemodynamically stable throughout ED course.  Final Clinical Impression(s) / ED Diagnoses Final diagnoses:  Lithium toxicity, accidental or unintentional, initial encounter  AKI (acute kidney injury) ([place])  Confusion    Rx / DC Orders ED Discharge Orders    None           Garald Balding, PA-C 06/04/19 1959    Lennice Sites, DO 06/04/19 2008

## 2019-06-05 LAB — COMPREHENSIVE METABOLIC PANEL
ALT: 22 U/L (ref 0–44)
AST: 26 U/L (ref 15–41)
Albumin: 3 g/dL — ABNORMAL LOW (ref 3.5–5.0)
Alkaline Phosphatase: 84 U/L (ref 38–126)
Anion gap: 6 (ref 5–15)
BUN: 16 mg/dL (ref 6–20)
CO2: 21 mmol/L — ABNORMAL LOW (ref 22–32)
Calcium: 9.6 mg/dL (ref 8.9–10.3)
Chloride: 113 mmol/L — ABNORMAL HIGH (ref 98–111)
Creatinine, Ser: 1.73 mg/dL — ABNORMAL HIGH (ref 0.44–1.00)
GFR calc Af Amer: 41 mL/min — ABNORMAL LOW (ref >60)
GFR calc non Af Amer: 35 mL/min — ABNORMAL LOW (ref >60)
Glucose, Bld: 140 mg/dL — ABNORMAL HIGH (ref 70–99)
Potassium: 3.9 mmol/L (ref 3.5–5.1)
Sodium: 140 mmol/L (ref 135–145)
Total Bilirubin: 0.6 mg/dL (ref 0.3–1.2)
Total Protein: 5.8 g/dL — ABNORMAL LOW (ref 6.5–8.1)

## 2019-06-05 LAB — CBC
HCT: 31.5 % — ABNORMAL LOW (ref 36.0–46.0)
Hemoglobin: 10 g/dL — ABNORMAL LOW (ref 12.0–15.0)
MCH: 30.3 pg (ref 26.0–34.0)
MCHC: 31.7 g/dL (ref 30.0–36.0)
MCV: 95.5 fL (ref 80.0–100.0)
Platelets: 124 10*3/uL — ABNORMAL LOW (ref 150–400)
RBC: 3.3 MIL/uL — ABNORMAL LOW (ref 3.87–5.11)
RDW: 13.2 % (ref 11.5–15.5)
WBC: 7.1 10*3/uL (ref 4.0–10.5)
nRBC: 0 % (ref 0.0–0.2)

## 2019-06-05 MED ORDER — OLANZAPINE 2.5 MG PO TABS
10.0000 mg | ORAL_TABLET | Freq: Every day | ORAL | Status: DC
  Administered 2019-06-06 – 2019-06-08 (×2): 10 mg via ORAL
  Filled 2019-06-05 (×5): qty 4

## 2019-06-05 MED ORDER — LEVOTHYROXINE SODIUM 25 MCG PO TABS
25.0000 ug | ORAL_TABLET | Freq: Every day | ORAL | Status: DC
  Administered 2019-06-06 – 2019-06-10 (×5): 25 ug via ORAL
  Filled 2019-06-05 (×6): qty 1

## 2019-06-05 MED ORDER — AMITRIPTYLINE HCL 25 MG PO TABS
100.0000 mg | ORAL_TABLET | Freq: Every day | ORAL | Status: DC
  Administered 2019-06-05 – 2019-06-09 (×5): 100 mg via ORAL
  Filled 2019-06-05 (×5): qty 4

## 2019-06-05 MED ORDER — MIRABEGRON ER 25 MG PO TB24
25.0000 mg | ORAL_TABLET | Freq: Every day | ORAL | Status: DC
  Administered 2019-06-05 – 2019-06-10 (×5): 25 mg via ORAL
  Filled 2019-06-05 (×6): qty 1

## 2019-06-05 MED ORDER — PANTOPRAZOLE SODIUM 40 MG PO TBEC
40.0000 mg | DELAYED_RELEASE_TABLET | Freq: Every day | ORAL | Status: DC
  Administered 2019-06-05 – 2019-06-10 (×5): 40 mg via ORAL
  Filled 2019-06-05 (×6): qty 1

## 2019-06-05 MED ORDER — LORAZEPAM 1 MG PO TABS
1.5000 mg | ORAL_TABLET | Freq: Every day | ORAL | Status: DC | PRN
  Administered 2019-06-06: 1.5 mg via ORAL
  Filled 2019-06-05: qty 1

## 2019-06-05 MED ORDER — FENOFIBRATE 160 MG PO TABS
160.0000 mg | ORAL_TABLET | Freq: Every day | ORAL | Status: DC
  Administered 2019-06-05 – 2019-06-10 (×5): 160 mg via ORAL
  Filled 2019-06-05 (×6): qty 1

## 2019-06-05 MED ORDER — PROPRANOLOL HCL 40 MG PO TABS
40.0000 mg | ORAL_TABLET | Freq: Two times a day (BID) | ORAL | Status: DC
  Administered 2019-06-05 – 2019-06-10 (×9): 40 mg via ORAL
  Filled 2019-06-05 (×12): qty 1

## 2019-06-05 MED ORDER — CALCIUM CARBONATE-VITAMIN D 500-200 MG-UNIT PO TABS
1.0000 | ORAL_TABLET | Freq: Every day | ORAL | Status: DC
  Administered 2019-06-05 – 2019-06-10 (×5): 1 via ORAL
  Filled 2019-06-05 (×6): qty 1

## 2019-06-05 MED ORDER — MONTELUKAST SODIUM 10 MG PO TABS
10.0000 mg | ORAL_TABLET | Freq: Every day | ORAL | Status: DC
  Administered 2019-06-05 – 2019-06-09 (×5): 10 mg via ORAL
  Filled 2019-06-05 (×5): qty 1

## 2019-06-05 MED ORDER — LINACLOTIDE 145 MCG PO CAPS
145.0000 ug | ORAL_CAPSULE | Freq: Every day | ORAL | Status: DC
  Administered 2019-06-05 – 2019-06-10 (×5): 145 ug via ORAL
  Filled 2019-06-05 (×6): qty 1

## 2019-06-05 MED ORDER — FLUTICASONE PROPIONATE 50 MCG/ACT NA SUSP
2.0000 | Freq: Every day | NASAL | Status: DC | PRN
  Filled 2019-06-05: qty 16

## 2019-06-05 NOTE — Progress Notes (Signed)
PROGRESS NOTE  Sonya Moran  DOB: 05-05-1975  PCP: Lucianne Lei, MD [id number]  DOA: 06/04/2019  LOS: 1 day   Chief Complaint  Patient presents with  . Altered Mental Status   Brief narrative: Sonya Moran is a 44 y.o. female with medical history significant of intellectual disability, PTSD, mental retardation with psychotic disorders. Patient was brought to the ED on 5/21 from a group home for worsening altered mental status.  She was noted to have slurred speech, weakness and confusion which has been going on gradually over the last few weeks.   On 5/19, she was seen by PCP.  Lithium level was collected was resulted next day showing an elevated level at 3 (therapeutic range 0.6-1.2).  Caregivers were instructed to send the patient to ED.    In the ED, patient was hemodynamically stable. Lithium level was rechecked and was at 1.94. Also noted to have AKI with creatinine 2.16, baseline normal. Poison control was contacted. Patient was admitted therefore for evaluation and treatment of lithium toxicity..  Subjective: Patient was seen and examined this morning.  Middle-aged Caucasian female with baseline cognitive issues.  Not in distress.  Able to have limited conversation.  Reports no new problem. Chart reviewed. Remains hemodynamically stable. Creatinine improving, 1.73 today.  Assessment/Plan: Lithium toxicity:  -Unintentional.  Level as high as 3 initially.  Gradually trending down, 1.94 on last check yesterday.  -Currently off of lithium.   -Poison control was contacted. -Monitor level until stabilized.  Target therapeutic range 0.6-1.2. -Repeat level in a.m. -No indication for hemodialysis at this point despite mild acute kidney injury.   Acute kidney injury -Most likely due to lithium toxicity.  May also have some degree of decreased hydration in the last few weeks because of poor oral intake. -Continue IV hydration.  Continue to monitor creatinine. -Keep Celebrex  on hold  Mood disorder with psychosis Intellectual disability -Supportive care. -Continue outpatient follow-up with psychiatry  -Home meds also include lithium 450 mg twice daily, amitriptyline 100 mg at bedtime, Lamictal 200 mg at bedtime, olanzapine 15 mg at bedtime, propanolol 40 mg twice daily, Ativan 1.5 mg daily as needed -Lithium on hold.  Resume others.  Hypothyroidism -Continue Synthroid  Continue other meds include fenofibrate, Flonase, Singulair, Dexilant, Myrbetriq.  Mobility: Encourage ambulation Code Status:  Full code  DVT prophylaxis:  Lovenox subcu Antimicrobials:  None Fluid: Normal saline at 100 mL/h Diet: Regular diet  Consultants: Poison control contacted over the phone on admission Family Communication:  None at bedside  Status is: Inpatient  Remains inpatient appropriate because:Ongoing diagnostic testing needed not appropriate for outpatient work up and IV treatments appropriate due to intensity of illness or inability to take PO   Dispo: The patient is from: Group home              Anticipated d/c is to: Group home              Anticipated d/c date is: 2 days              Patient currently is not medically stable to d/c.    Antimicrobials: Anti-infectives (From admission, onward)   None        Code Status: Full Code   Diet Order            Diet regular Room service appropriate? Yes; Fluid consistency: Thin  Diet effective now              Infusions:  .  sodium chloride 100 mL/hr at 06/05/19 0955    Scheduled Meds: . amitriptyline  100 mg Oral QHS  . calcium-vitamin D  1 tablet Oral Daily  . enoxaparin (LOVENOX) injection  30 mg Subcutaneous Q24H  . fenofibrate  160 mg Oral Daily  . [START ON 06/06/2019] levothyroxine  25 mcg Oral Q0600  . linaclotide  145 mcg Oral Daily  . mirabegron ER  25 mg Oral Daily  . montelukast  10 mg Oral QHS  . [START ON 06/06/2019] OLANZapine  10 mg Oral QPC breakfast  . pantoprazole  40 mg Oral  Daily  . propranolol  40 mg Oral BID    PRN meds: acetaminophen **OR** acetaminophen, fluticasone, LORazepam, ondansetron **OR** ondansetron (ZOFRAN) IV   Objective: Vitals:   06/05/19 0917 06/05/19 1206  BP: 127/78 127/76  Pulse: 82 80  Resp: 20 20  Temp: 98.6 F (37 C) 97.9 F (36.6 C)  SpO2: 93% 96%    Intake/Output Summary (Last 24 hours) at 06/05/2019 1312 Last data filed at 06/05/2019 0929 Gross per 24 hour  Intake 2963.3 ml  Output 1750 ml  Net 1213.3 ml   Filed Weights   06/04/19 1548 06/04/19 2051  Weight: 71.7 kg 71.7 kg   Weight change:  Body mass index is 33.02 kg/m.   Physical Exam: General exam: Appears calm and comfortable.  Not in physical distress Skin: No rashes, lesions or ulcers. HEENT: Atraumatic, normocephalic, supple neck, no obvious bleeding Lungs: Clear to auscultation bilaterally CVS: Regular rate and rhythm, no murmur GI/Abd soft, nontender, nondistended, CNS: Alert, awake, knows that he is in the hospital.  Baseline cognitive issues Psychiatry: Mood appropriate Extremities: No pedal edema, no calf tenderness  Data Review: I have personally reviewed the laboratory data and studies available.  Recent Labs  Lab 06/04/19 1648 06/05/19 0356  WBC 8.5 7.1  NEUTROABS 6.4  --   HGB 11.5* 10.0*  HCT 36.8 31.5*  MCV 96.8 95.5  PLT 137* 124*   Recent Labs  Lab 06/04/19 1648 06/05/19 0356  NA 134* 140  K 4.2 3.9  CL 105 113*  CO2 21* 21*  GLUCOSE 110* 140*  BUN 24* 16  CREATININE 2.16* 1.73*  CALCIUM 10.3 9.6  MG 2.3  --     Signed, Terrilee Croak, MD [facility] Pager: [phone] (Secure Chat preferred). 06/05/2019

## 2019-06-06 LAB — BASIC METABOLIC PANEL
Anion gap: 6 (ref 5–15)
BUN: 11 mg/dL (ref 6–20)
CO2: 20 mmol/L — ABNORMAL LOW (ref 22–32)
Calcium: 10.1 mg/dL (ref 8.9–10.3)
Chloride: 115 mmol/L — ABNORMAL HIGH (ref 98–111)
Creatinine, Ser: 1.65 mg/dL — ABNORMAL HIGH (ref 0.44–1.00)
GFR calc Af Amer: 43 mL/min — ABNORMAL LOW (ref >60)
GFR calc non Af Amer: 37 mL/min — ABNORMAL LOW (ref >60)
Glucose, Bld: 120 mg/dL — ABNORMAL HIGH (ref 70–99)
Potassium: 4.3 mmol/L (ref 3.5–5.1)
Sodium: 141 mmol/L (ref 135–145)

## 2019-06-06 LAB — CBC WITH DIFFERENTIAL/PLATELET
Abs Immature Granulocytes: 0.03 10*3/uL (ref 0.00–0.07)
Basophils Absolute: 0.1 10*3/uL (ref 0.0–0.1)
Basophils Relative: 1 %
Eosinophils Absolute: 0.3 10*3/uL (ref 0.0–0.5)
Eosinophils Relative: 3 %
HCT: 34.4 % — ABNORMAL LOW (ref 36.0–46.0)
Hemoglobin: 10.7 g/dL — ABNORMAL LOW (ref 12.0–15.0)
Immature Granulocytes: 0 %
Lymphocytes Relative: 23 %
Lymphs Abs: 1.8 10*3/uL (ref 0.7–4.0)
MCH: 29.9 pg (ref 26.0–34.0)
MCHC: 31.1 g/dL (ref 30.0–36.0)
MCV: 96.1 fL (ref 80.0–100.0)
Monocytes Absolute: 0.6 10*3/uL (ref 0.1–1.0)
Monocytes Relative: 7 %
Neutro Abs: 5.1 10*3/uL (ref 1.7–7.7)
Neutrophils Relative %: 66 %
Platelets: 149 10*3/uL — ABNORMAL LOW (ref 150–400)
RBC: 3.58 MIL/uL — ABNORMAL LOW (ref 3.87–5.11)
RDW: 13.2 % (ref 11.5–15.5)
WBC: 7.8 10*3/uL (ref 4.0–10.5)
nRBC: 0 % (ref 0.0–0.2)

## 2019-06-06 LAB — LITHIUM LEVEL: Lithium Lvl: 0.95 mmol/L (ref 0.60–1.20)

## 2019-06-06 MED ORDER — OLANZAPINE 2.5 MG PO TABS
15.0000 mg | ORAL_TABLET | Freq: Every day | ORAL | Status: DC
  Administered 2019-06-06 – 2019-06-09 (×4): 15 mg via ORAL
  Filled 2019-06-06 (×4): qty 6

## 2019-06-06 MED ORDER — PHENOL 1.4 % MT LIQD
1.0000 | OROMUCOSAL | Status: DC | PRN
  Administered 2019-06-06: 1 via OROMUCOSAL
  Filled 2019-06-06: qty 177

## 2019-06-06 NOTE — Progress Notes (Signed)
Pt got confused and agitated, pulled out IV and was walking around room refusing to go back to bed.  Pt sat in chair but then decided to wander out into the hallway and would not be redirected to go back to room and back to bed.  Managed to get pt to sit in chair in hallway but pt would not go back to room, pt tried to open exit door at end of hallway.  Called emergency button (duress) to get Charge Nurse Levada Dy, RN) to assist with redirecting patient.  But patient did not want to go back to room and became agitated.  Security was called to assist.  Patient was eventually redirected back to bed.  Pt was given 1.5 mg Ativan po which seems to help calm her down.  Reported to night RN to continue to monitor.  Notified Attending physician, Dr. Pietro Cassis, MD of episode of confusion and agitation.

## 2019-06-06 NOTE — Progress Notes (Signed)
PROGRESS NOTE  [name]  DOB: [birth year]  PCP: Lucianne Lei, MD [id number]  DOA: 06/04/2019  LOS: 2 days   Chief Complaint  Patient presents with  . Altered Mental Status   Brief narrative: Sonya Moran is a 44 y.o. female with medical history significant of intellectual disability, PTSD, mental retardation with psychotic disorders. Patient was brought to the ED on 5/21 from a group home for worsening altered mental status.  She was noted to have slurred speech, weakness and confusion which has been going on gradually over the last few weeks.   On 5/19, she was seen by PCP.  Lithium level was collected was resulted next day showing an elevated level at 3 (therapeutic range 0.6-1.2).  Caregivers were instructed to send the patient to ED.    In the ED, patient was hemodynamically stable. Lithium level was rechecked and was at 1.94. Also noted to have AKI with creatinine 2.16, baseline normal. Poison control was contacted. Patient was admitted therefore for evaluation and treatment of lithium toxicity..  Subjective: Patient was seen and examined this morning.  Has baseline cognitive issues.  Able to answer simple questions.  Feels great today.  RN at bedside.  Did not report any specific issue.  Patient had a temperature of 101 yesterday.  No recurrence.  Labs from this morning showed normal WBC count, creatinine improving, 1.65 today  Assessment/Plan: Lithium toxicity:  -Unintentional.  Level as high as 3 initially.  Gradually trending down, 1.94 on last check yesterday.  -Currently off of lithium.   -Poison control was contacted. -Monitor level until stabilized.  Target therapeutic range 0.6-1.2. -Repeat level this morning shows it to be in therapeutic range at 0.94 -Continue to trend.  Acute kidney injury -Most likely due to lithium toxicity.  May also have some degree of decreased hydration in the last few weeks because of poor oral intake. -Creatinine improving,  1.65 today.  Continue IV hydration.  Continue to monitor creatinine. -Keep Celebrex on hold  Mood disorder with psychosis Intellectual disability -Supportive care. -Continue outpatient follow-up with psychiatry  -Home meds also include lithium 450 mg twice daily, amitriptyline 100 mg at bedtime, Lamictal 200 mg at bedtime, olanzapine 15 mg at bedtime, propanolol 40 mg twice daily, Ativan 1.5 mg daily as needed -Lithium on hold.  Resume others.  Hypothyroidism -Continue Synthroid  Continue other meds include fenofibrate, Flonase, Singulair, Dexilant, Myrbetriq.  Mobility: Encourage ambulation Code Status:  Full code  DVT prophylaxis:  Lovenox subcu Antimicrobials:  None Fluid: Reduce normal saline rate to 50 mill per hour. Diet: Regular diet  Consultants: Poison control contacted over the phone on admission Family Communication:  None at bedside  Status is: Inpatient  Remains inpatient appropriate because:Ongoing diagnostic testing needed not appropriate for outpatient work up and IV treatments appropriate due to intensity of illness or inability to take PO   Dispo: The patient is from: Group home              Anticipated d/c is to: Group home              Anticipated d/c date is: 2 days              Patient currently is not medically stable to d/c.   Antimicrobials: Anti-infectives (From admission, onward)   None        Code Status: Full Code   Diet Order            Diet regular Room service  appropriate? Yes; Fluid consistency: Thin  Diet effective now              Infusions:  . sodium chloride 100 mL/hr at 06/06/19 [id number]    Scheduled Meds: . amitriptyline  100 mg Oral QHS  . calcium-vitamin D  1 tablet Oral Daily  . enoxaparin (LOVENOX) injection  30 mg Subcutaneous Q24H  . fenofibrate  160 mg Oral Daily  . levothyroxine  25 mcg Oral Q0600  . linaclotide  145 mcg Oral Daily  . mirabegron ER  25 mg Oral Daily  . montelukast  10 mg Oral QHS  .  OLANZapine  10 mg Oral QPC breakfast  . pantoprazole  40 mg Oral Daily  . propranolol  40 mg Oral BID    PRN meds: acetaminophen **OR** acetaminophen, fluticasone, LORazepam, ondansetron **OR** ondansetron (ZOFRAN) IV, phenol   Objective: Vitals:   06/06/19 0316 06/06/19 0826  BP: 119/73 126/82  Pulse: (!) 59 64  Resp: 18 18  Temp: 99 F (37.2 C) 98.9 F (37.2 C)  SpO2: 95% 95%    Intake/Output Summary (Last 24 hours) at 06/06/2019 1142 Last data filed at 06/06/2019 0653 Gross per 24 hour  Intake 2140.61 ml  Output 975 ml  Net 1165.61 ml   Filed Weights   06/04/19 1548 06/04/19 2051  Weight: 71.7 kg 71.7 kg   Weight change:  Body mass index is 33.02 kg/m.   Physical Exam: General exam: Appears calm and comfortable. Not in physical distress Skin: No rashes, lesions or ulcers. HEENT: Atraumatic, normocephalic, supple neck, no obvious bleeding Lungs: Clear to auscultation bilaterally CVS: Regular rate and rhythm, no murmur GI/Abd soft, nontender, nondistended, CNS: Alert, awake, knows that he is in the hospital.  Baseline cognitive issues Psychiatry: Mood appropriate Extremities: No pedal edema, no calf tenderness  Data Review: I have personally reviewed the laboratory data and studies available.  Recent Labs  Lab 06/04/19 1648 06/05/19 0356 06/06/19 0452  WBC 8.5 7.1 7.8  NEUTROABS 6.4  --  5.1  HGB 11.5* 10.0* 10.7*  HCT 36.8 31.5* 34.4*  MCV 96.8 95.5 96.1  PLT 137* 124* 149*   Recent Labs  Lab 06/04/19 1648 06/05/19 0356 06/06/19 0452  NA 134* 140 141  K 4.2 3.9 4.3  CL 105 113* 115*  CO2 21* 21* 20*  GLUCOSE 110* 140* 120*  BUN 24* 16 11  CREATININE 2.16* 1.73* 1.65*  CALCIUM 10.3 9.6 10.1  MG 2.3  --   --     Signed, Terrilee Croak, MD [facility] Pager: [phone] (Secure Chat preferred). 06/06/2019

## 2019-06-07 DIAGNOSIS — F250 Schizoaffective disorder, bipolar type: Secondary | ICD-10-CM | POA: Diagnosis present

## 2019-06-07 LAB — COMPREHENSIVE METABOLIC PANEL
ALT: 22 U/L (ref 0–44)
AST: 30 U/L (ref 15–41)
Albumin: 3 g/dL — ABNORMAL LOW (ref 3.5–5.0)
Alkaline Phosphatase: 87 U/L (ref 38–126)
Anion gap: 7 (ref 5–15)
BUN: 7 mg/dL (ref 6–20)
CO2: 22 mmol/L (ref 22–32)
Calcium: 9.9 mg/dL (ref 8.9–10.3)
Chloride: 115 mmol/L — ABNORMAL HIGH (ref 98–111)
Creatinine, Ser: 1.55 mg/dL — ABNORMAL HIGH (ref 0.44–1.00)
GFR calc Af Amer: 46 mL/min — ABNORMAL LOW (ref >60)
GFR calc non Af Amer: 40 mL/min — ABNORMAL LOW (ref >60)
Glucose, Bld: 117 mg/dL — ABNORMAL HIGH (ref 70–99)
Potassium: 3.9 mmol/L (ref 3.5–5.1)
Sodium: 144 mmol/L (ref 135–145)
Total Bilirubin: 0.8 mg/dL (ref 0.3–1.2)
Total Protein: 5.7 g/dL — ABNORMAL LOW (ref 6.5–8.1)

## 2019-06-07 LAB — CBC WITH DIFFERENTIAL/PLATELET
Abs Immature Granulocytes: 0.01 10*3/uL (ref 0.00–0.07)
Basophils Absolute: 0 10*3/uL (ref 0.0–0.1)
Basophils Relative: 1 %
Eosinophils Absolute: 0.3 10*3/uL (ref 0.0–0.5)
Eosinophils Relative: 5 %
HCT: 31 % — ABNORMAL LOW (ref 36.0–46.0)
Hemoglobin: 10 g/dL — ABNORMAL LOW (ref 12.0–15.0)
Immature Granulocytes: 0 %
Lymphocytes Relative: 24 %
Lymphs Abs: 1.4 10*3/uL (ref 0.7–4.0)
MCH: 30.4 pg (ref 26.0–34.0)
MCHC: 32.3 g/dL (ref 30.0–36.0)
MCV: 94.2 fL (ref 80.0–100.0)
Monocytes Absolute: 0.5 10*3/uL (ref 0.1–1.0)
Monocytes Relative: 9 %
Neutro Abs: 3.8 10*3/uL (ref 1.7–7.7)
Neutrophils Relative %: 61 %
Platelets: 146 10*3/uL — ABNORMAL LOW (ref 150–400)
RBC: 3.29 MIL/uL — ABNORMAL LOW (ref 3.87–5.11)
RDW: 13.2 % (ref 11.5–15.5)
WBC: 6 10*3/uL (ref 4.0–10.5)
nRBC: 0 % (ref 0.0–0.2)

## 2019-06-07 LAB — MAGNESIUM: Magnesium: 1.9 mg/dL (ref 1.7–2.4)

## 2019-06-07 LAB — PHOSPHORUS: Phosphorus: 2.1 mg/dL — ABNORMAL LOW (ref 2.5–4.6)

## 2019-06-07 MED ORDER — HALOPERIDOL LACTATE 5 MG/ML IJ SOLN
2.0000 mg | Freq: Four times a day (QID) | INTRAMUSCULAR | Status: DC | PRN
  Administered 2019-06-07: 2 mg via INTRAVENOUS
  Filled 2019-06-07: qty 1

## 2019-06-07 MED ORDER — SODIUM PHOSPHATES 45 MMOLE/15ML IV SOLN
20.0000 mmol | Freq: Once | INTRAVENOUS | Status: AC
  Administered 2019-06-07: 20 mmol via INTRAVENOUS
  Filled 2019-06-07: qty 6.67

## 2019-06-07 MED ORDER — LAMOTRIGINE 100 MG PO TABS
200.0000 mg | ORAL_TABLET | Freq: Every day | ORAL | Status: DC
  Administered 2019-06-08 – 2019-06-10 (×3): 200 mg via ORAL
  Filled 2019-06-07 (×4): qty 2

## 2019-06-07 NOTE — Progress Notes (Signed)
   06/07/19 1910  What Happened  Was fall witnessed? No  Was patient injured? No  Patient found on floor  Found by Staff-comment [place])  Stated prior activity other (comment) (Confused)  Follow Up  MD notified Dr. Sharlet Salina  Time MD notified 1910  Family notified No - patient refusal (No family available)  Additional tests No  Simple treatment  (None)  Progress note created (see row info) Yes  Adult Fall Risk Assessment  Risk Factor Category (scoring not indicated) High fall risk per protocol (document High fall risk)  Patient Fall Risk Level High fall risk  Adult Fall Risk Interventions  Required Bundle Interventions *See Row Information* High fall risk - low, moderate, and high requirements implemented  Additional Interventions Safety Sitter/Safety Rounder  Screening for Fall Injury Risk (To be completed on HIGH fall risk patients) - Assessing Need for Low Bed  Risk For Fall Injury- Low Bed Criteria None identified - Continue screening  Screening for Fall Injury Risk (To be completed on HIGH fall risk patients who do not meet crieteria for Low Bed) - Assessing Need for Floor Mats Only  Risk For Fall Injury- Criteria for Floor Mats Noncompliant with safety precautions;Confusion/dementia (+NuDESC, CIWA, TBI, etc.)  Vitals  Temp 97.9 F (36.6 C)  Temp Source Axillary  BP (!) 136/92  MAP (mmHg) 107  BP Location Left Arm  BP Method Automatic  Patient Position (if appropriate) Lying  Pulse Rate 95  Pulse Rate Source Dinamap  Resp 20  Oxygen Therapy  SpO2 99 %  O2 Device Room Air

## 2019-06-07 NOTE — Progress Notes (Signed)
Poison control signed off

## 2019-06-07 NOTE — Progress Notes (Addendum)
PROGRESS NOTE  Sonya Moran  DOB: [birth year]  PCP: Lucianne Lei, MD [id number]  DOA: 06/04/2019  LOS: 3 days   Chief Complaint  Patient presents with  . Altered Mental Status   Brief narrative: Sonya Moran is a 44 y.o. female with medical history significant of intellectual disability, PTSD, mental retardation with psychotic disorders. Patient was brought to the ED on 5/21 from a group home for worsening altered mental status.  She was noted to have slurred speech, weakness and confusion which has been going on gradually over the last few weeks.   On 5/19, she was seen by PCP.  Lithium level was collected was resulted next day showing an elevated level at 3 (therapeutic range 0.6-1.2).  Caregivers were instructed to send the patient to ED.    In the ED, patient was hemodynamically stable. Lithium level was rechecked and was at 1.94. Also noted to have AKI with creatinine 2.16, baseline normal. Poison control was contacted. Patient was admitted therefore for evaluation and treatment of lithium toxicity..  Subjective: Patient was seen and examined this morning.  Middle-aged Caucasian female.  With cognitive disability. Lying down in bed.  Tearful, confused, seems to be hallucinating.  RN at bedside counseling the patient Events from yesterday noted. Patient was agitated, restless, climbing out of bed and wandering on the hallway.  She was given Ativan as needed like she used to be taking prior to admission.  Chart reviewed.  Afebrile, heart rate in 60s, blood pressure controlled. Labs with sodium 144, creatinine improving, 1.55 today, was 1.65 yesterday. Phosphorus level is low at 2.1.  Assessment/Plan: Lithium toxicity:  -Unintentional.  Level as high as 3 initially.  Gradually trending down, 1.94 on last check yesterday.  -Currently off of lithium.   -Poison control was contacted. -Monitor level until stabilized.  Target therapeutic range 0.6-1.2. -Repeat level this  morning shows it to be in therapeutic range at 0.94 -Continue to trend.  Acute delirium  History of mood disorder with psychosis and intellectual disability -Since yesterday, patient has remained agitated, restless, climbing out of bed and wandering on the hallway.  Similar behavior last night and this morning.   -She was given Ativan as needed like she used to be taking prior to admission. -Home meds also include lithium 450 mg twice daily, amitriptyline 100 mg at bedtime, Lamictal 200 mg at bedtime, olanzapine 15 mg at bedtime, propanolol 40 mg twice daily, Ativan 1.5 mg daily as needed -Continue all psych meds.  I resumed Lamictal as well this morning. -Psychiatry consultation called.  Acute kidney injury -Most likely due to lithium toxicity.  May also have some degree of decreased hydration in the last few weeks because of poor oral intake. -Creatinine improving, 1.55 today.  Increase saline from 50 to 100 mL/h. Continue to monitor creatinine.  Encourage oral hydration as well -Keep Celebrex on hold  Hypothyroidism -Continue Synthroid  Continue other meds include fenofibrate, Flonase, Singulair, Dexilant, Myrbetriq.  Mobility: Encourage ambulation Code Status:  Full code  DVT prophylaxis:  Lovenox subcu Antimicrobials:  None Fluid: Saline at 100 mL/h Diet: Regular diet  Consultants: Poison control contacted over the phone on admission Family Communication:  None at bedside  Status is: Inpatient  Remains inpatient appropriate because:Ongoing diagnostic testing needed not appropriate for outpatient work up and IV treatments appropriate due to intensity of illness or inability to take PO   Dispo: The patient is from: Group home  Anticipated d/c is to: Group home              Anticipated d/c date is: 2 days              Patient currently is not medically stable to d/c.   Antimicrobials: Anti-infectives (From admission, onward)   None        Code Status:  Full Code   Diet Order            Diet regular Room service appropriate? Yes; Fluid consistency: Thin  Diet effective now              Infusions:  . sodium chloride 50 mL/hr at 06/07/19 0524  . sodium phosphate  Dextrose 5% IVPB      Scheduled Meds: . amitriptyline  100 mg Oral QHS  . calcium-vitamin D  1 tablet Oral Daily  . enoxaparin (LOVENOX) injection  30 mg Subcutaneous Q24H  . fenofibrate  160 mg Oral Daily  . levothyroxine  25 mcg Oral Q0600  . linaclotide  145 mcg Oral Daily  . mirabegron ER  25 mg Oral Daily  . montelukast  10 mg Oral QHS  . OLANZapine  10 mg Oral QPC breakfast  . OLANZapine  15 mg Oral QHS  . pantoprazole  40 mg Oral Daily  . propranolol  40 mg Oral BID    PRN meds: acetaminophen **OR** acetaminophen, fluticasone, LORazepam, ondansetron **OR** ondansetron (ZOFRAN) IV, phenol   Objective: Vitals:   06/07/19 0451 06/07/19 0739  BP: 114/75 (!) 145/81  Pulse: 66 75  Resp: 19 18  Temp: 99.2 F (37.3 C) 98.9 F (37.2 C)  SpO2: 95% 97%    Intake/Output Summary (Last 24 hours) at 06/07/2019 0902 Last data filed at 06/07/2019 0901 Gross per 24 hour  Intake 2036 ml  Output --  Net 2036 ml   Filed Weights   06/04/19 1548 06/04/19 2051  Weight: 71.7 kg 71.7 kg   Weight change:  Body mass index is 33.02 kg/m.   Physical Exam: General exam: Not in physical distress Skin: No rashes, lesions or ulcers. HEENT: Atraumatic, normocephalic, supple neck, no obvious bleeding Lungs: Clear to auscultation bilaterally CVS: Regular rate and rhythm, no murmur GI/Abd soft, nontender, nondistended, CNS: Alert, awake, knows that he is in the hospital.  Baseline cognitive issues Psychiatry: Tearful Extremities: No pedal edema, no calf tenderness  Data Review: I have personally reviewed the laboratory data and studies available.  Recent Labs  Lab 06/04/19 1648 06/05/19 0356 06/06/19 0452 06/07/19 0425  WBC 8.5 7.1 7.8 6.0  NEUTROABS 6.4  --   5.1 3.8  HGB 11.5* 10.0* 10.7* 10.0*  HCT 36.8 31.5* 34.4* 31.0*  MCV 96.8 95.5 96.1 94.2  PLT 137* 124* 149* 146*   Recent Labs  Lab 06/04/19 1648 06/05/19 0356 06/06/19 0452 06/07/19 0425  NA 134* 140 141 144  K 4.2 3.9 4.3 3.9  CL 105 113* 115* 115*  CO2 21* 21* 20* 22  GLUCOSE 110* 140* 120* 117*  BUN 24* 16 11 7   CREATININE 2.16* 1.73* 1.65* 1.55*  CALCIUM 10.3 9.6 10.1 9.9  MG 2.3  --   --  1.9  PHOS  --   --   --  2.1*    Signed, Terrilee Croak, MD [facility] Pager: [phone] (Secure Chat preferred). 06/07/2019

## 2019-06-07 NOTE — Consult Note (Signed)
[facility] Face-to-Face Psychiatry Consult   Reason for Consult:  Lithium toxicity, agitation, hallucinations Referring Physician:  Dr Pietro Cassis Patient Identification: Sonya Moran MRN:  [id number] Principal Diagnosis: Lithium toxicity Diagnosis:  Principal Problem:   Lithium toxicity Active Problems:   Schizoaffective disorder, bipolar type ([place])   Mood disorder ([place])   ARF (acute renal failure) ([place])  Total Time spent with patient: 1 hour  Subjective:   Sonya Moran is a 44 y.o. female patient admitted with lithium toxicity.  Patient seen and evaluated in person by this provider.  She smiled pleasantly on assessment and answered "good" to most questions.  Patient was diagonal in the bed and appeared that she had been restless prior to assessment.  No suicidal/homicidal ideations, hallucinations, or substance abuse.  She was admitted for lithium toxicity and experienced some agitation and hallucinations.  Not responding to internal stimuli on assessment, recommend increasing Zyprexa to 10 mg in the morning and 50 mg at bedtime to 15 in the morning and 15 in the evening.  If agitation continues, recommend Ativan 1.5 mg daily as needed changed to 1 mg 3 times daily as needed agitation.  Caveat: Client is IDD and baseline unknown to this provider.  Dr. Dwyane Dee reviewed this client and concurs with the treatment plan below.  HPI per MD: Sonya Moran is a 44 y.o. female with medical history significant of intellectual disability, PTSD, mental retardation with psychotic disorders who was brought in from a group home secondary to worsening altered mental status.  Patient was noted to have slurred speech weakness and confusion which has been going on gradually over the last few weeks.  She was seen by PCP on May 19.  Lithium level was collected and sent.  It came back yesterday is 3.0.  Caregiver was informed this morning to bring patient to the ER.  Patient arrive ER and was found to have lithium level of 1.9 with  acute kidney injury.  She is still confused and not at baseline.  Patient is unable to write her name or dress herself.  She was able to interact before and communicate but not anymore.  No fever or chills no nausea vomiting with diarrhea.  Patient is being admitted therefore for evaluation of and treatment of her lithium toxicity..  Past Psychiatric History: Schizoaffective disorder, PTSD  Risk to Self:   Risk to Others:   Prior Inpatient Therapy:   Prior Outpatient Therapy:    Past Medical History:  Past Medical History:  Diagnosis Date  . Depressive disorder   . Mental retardation   . Post traumatic stress disorder   . Psychotic disorder [facility])     Past Surgical History:  Procedure Laterality Date  . BACK SURGERY    . WRIST SURGERY     Family History: History reviewed. No pertinent family history. Family Psychiatric  History: Unknown Social History:  Social History   Substance and Sexual Activity  Alcohol Use No  . Alcohol/week: 0.0 standard drinks     Social History   Substance and Sexual Activity  Drug Use No    Social History   Socioeconomic History  . Marital status: Single    Spouse name: Not on file  . Number of children: Not on file  . Years of education: Not on file  . Highest education level: Not on file  Occupational History  . Not on file  Tobacco Use  . Smoking status: Never Smoker  . Smokeless tobacco: Never Used  Substance  and Sexual Activity  . Alcohol use: No    Alcohol/week: 0.0 standard drinks  . Drug use: No  . Sexual activity: Not on file  Other Topics Concern  . Not on file  Social History Narrative  . Not on file   Social Determinants of Health   Financial Resource Strain:   . Difficulty of Paying Living Expenses:   Food Insecurity:   . Worried About Charity fundraiser in the Last Year:   . Arboriculturist in the Last Year:   Transportation Needs:   . Film/video editor (Medical):   Marland Kitchen Lack of Transportation  (Non-Medical):   Physical Activity:   . Days of Exercise per Week:   . Minutes of Exercise per Session:   Stress:   . Feeling of Stress :   Social Connections:   . Frequency of Communication with Friends and Family:   . Frequency of Social Gatherings with Friends and Family:   . Attends Religious Services:   . Active Member of Clubs or Organizations:   . Attends Archivist Meetings:   Marland Kitchen Marital Status:    Additional Social History:    Allergies:   Allergies  Allergen Reactions  . Grifulvin V [Griseofulvin] Anaphylaxis and Other (See Comments)    Arm swelling    Labs:  Results for orders placed or performed during the hospital encounter of 06/04/19 (from the past 48 hour(s))  Lithium level     Status: None   Collection Time: 06/06/19  4:52 AM  Result Value Ref Range   Lithium Lvl 0.95 0.60 - 1.20 mmol/L    Comment: Performed at [facility], [place] [address] [place], [place] [id number]  Basic metabolic panel     Status: Abnormal   Collection Time: 06/06/19  4:52 AM  Result Value Ref Range   Sodium 141 135 - 145 mmol/L   Potassium 4.3 3.5 - 5.1 mmol/L   Chloride 115 (H) 98 - 111 mmol/L   CO2 20 (L) 22 - 32 mmol/L   Glucose, Bld 120 (H) 70 - 99 mg/dL    Comment: Glucose reference range applies only to samples taken after fasting for at least 8 hours.   BUN 11 6 - 20 mg/dL   Creatinine, Ser 1.65 (H) 0.44 - 1.00 mg/dL   Calcium 10.1 8.9 - 10.3 mg/dL   GFR calc non Af Amer 37 (L) >60 mL/min   GFR calc Af Amer 43 (L) >60 mL/min   Anion gap 6 5 - 15    Comment: Performed at [facility] [address] [place], [place] [address]  CBC with Differential/Platelet     Status: Abnormal   Collection Time: 06/06/19  4:52 AM  Result Value Ref Range   WBC 7.8 4.0 - 10.5 K/uL   RBC 3.58 (L) 3.87 - 5.11 MIL/uL   Hemoglobin 10.7 (L) 12.0 - 15.0 g/dL   HCT 34.4 (L) 36.0 - 46.0 %   MCV 96.1 80.0 - 100.0 fL   MCH 29.9 26.0 - 34.0 pg   MCHC 31.1 30.0 - 36.0 g/dL    RDW 13.2 11.5 - 15.5 %   Platelets 149 (L) 150 - 400 K/uL   nRBC 0.0 0.0 - 0.2 %   Neutrophils Relative % 66 %   Neutro Abs 5.1 1.7 - 7.7 K/uL   Lymphocytes Relative 23 %   Lymphs Abs 1.8 0.7 - 4.0 K/uL   Monocytes Relative 7 %  Monocytes Absolute 0.6 0.1 - 1.0 K/uL   Eosinophils Relative 3 %   Eosinophils Absolute 0.3 0.0 - 0.5 K/uL   Basophils Relative 1 %   Basophils Absolute 0.1 0.0 - 0.1 K/uL   Immature Granulocytes 0 %   Abs Immature Granulocytes 0.03 0.00 - 0.07 K/uL    Comment: Performed at [facility], [place] [address], [place], [place] [address]  Comprehensive metabolic panel     Status: Abnormal   Collection Time: 06/07/19  4:25 AM  Result Value Ref Range   Sodium 144 135 - 145 mmol/L   Potassium 3.9 3.5 - 5.1 mmol/L   Chloride 115 (H) 98 - 111 mmol/L   CO2 22 22 - 32 mmol/L   Glucose, Bld 117 (H) 70 - 99 mg/dL    Comment: Glucose reference range applies only to samples taken after fasting for at least 8 hours.   BUN 7 6 - 20 mg/dL   Creatinine, Ser 1.55 (H) 0.44 - 1.00 mg/dL   Calcium 9.9 8.9 - 10.3 mg/dL   Total Protein 5.7 (L) 6.5 - 8.1 g/dL   Albumin 3.0 (L) 3.5 - 5.0 g/dL   AST 30 15 - 41 U/L   ALT 22 0 - 44 U/L   Alkaline Phosphatase 87 38 - 126 U/L   Total Bilirubin 0.8 0.3 - 1.2 mg/dL   GFR calc non Af Amer 40 (L) >60 mL/min   GFR calc Af Amer 46 (L) >60 mL/min   Anion gap 7 5 - 15    Comment: Performed at [facility] [address], [place], [place] [address]  CBC with Differential/Platelet     Status: Abnormal   Collection Time: 06/07/19  4:25 AM  Result Value Ref Range   WBC 6.0 4.0 - 10.5 K/uL   RBC 3.29 (L) 3.87 - 5.11 MIL/uL   Hemoglobin 10.0 (L) 12.0 - 15.0 g/dL   HCT 31.0 (L) 36.0 - 46.0 %   MCV 94.2 80.0 - 100.0 fL   MCH 30.4 26.0 - 34.0 pg   MCHC 32.3 30.0 - 36.0 g/dL   RDW 13.2 11.5 - 15.5 %   Platelets 146 (L) 150 - 400 K/uL   nRBC 0.0 0.0 - 0.2 %   Neutrophils Relative % 61 %   Neutro Abs 3.8 1.7 - 7.7 K/uL    Lymphocytes Relative 24 %   Lymphs Abs 1.4 0.7 - 4.0 K/uL   Monocytes Relative 9 %   Monocytes Absolute 0.5 0.1 - 1.0 K/uL   Eosinophils Relative 5 %   Eosinophils Absolute 0.3 0.0 - 0.5 K/uL   Basophils Relative 1 %   Basophils Absolute 0.0 0.0 - 0.1 K/uL   Immature Granulocytes 0 %   Abs Immature Granulocytes 0.01 0.00 - 0.07 K/uL    Comment: Performed at [facility] [address] [place], [place] [address]  Magnesium     Status: None   Collection Time: 06/07/19  4:25 AM  Result Value Ref Range   Magnesium 1.9 1.7 - 2.4 mg/dL    Comment: Performed at [facility] [address], [place], [place] [address]  Phosphorus     Status: Abnormal   Collection Time: 06/07/19  4:25 AM  Result Value Ref Range   Phosphorus 2.1 (L) 2.5 - 4.6 mg/dL    Comment: Performed at [facility] [address], [place], [place] [address]    Current Facility-Administered Medications  Medication Dose Route  Frequency Provider Last Rate Last Admin  . 0.9 %  sodium chloride infusion   Intravenous Continuous Dahal, Binaya, MD 100 mL/hr at 06/07/19 1119 New Bag at 06/07/19 1119  . acetaminophen (TYLENOL) tablet 650 mg  650 mg Oral Q6H PRN Elwyn Reach, MD   650 mg at 06/05/19 1858   Or  . acetaminophen (TYLENOL) suppository 650 mg  650 mg Rectal Q6H PRN Elwyn Reach, MD      . amitriptyline (ELAVIL) tablet 100 mg  100 mg Oral QHS Dahal, Marlowe Aschoff, MD   100 mg at 06/06/19 2259  . calcium-vitamin D (OSCAL WITH D) 500-200 MG-UNIT per tablet 1 tablet  1 tablet Oral Daily Dahal, Binaya, MD   1 tablet at 06/06/19 1006  . enoxaparin (LOVENOX) injection 30 mg  30 mg Subcutaneous Q24H Gala Romney L, MD   30 mg at 06/06/19 2300  . fenofibrate tablet 160 mg  160 mg Oral Daily Dahal, Marlowe Aschoff, MD   160 mg at 06/06/19 1006  . fluticasone (FLONASE) 50 MCG/ACT nasal spray 2 spray  2 spray Each Nare Daily PRN Dahal, Binaya, MD      . lamoTRIgine (LAMICTAL) tablet 200 mg  200 mg Oral Daily Dahal,  Binaya, MD      . levothyroxine (SYNTHROID) tablet 25 mcg  25 mcg Oral [id number] Terrilee Croak, MD   25 mcg at 06/07/19 0522  . linaclotide (LINZESS) capsule 145 mcg  145 mcg Oral Daily Dahal, Marlowe Aschoff, MD   145 mcg at 06/06/19 1006  . LORazepam (ATIVAN) tablet 1.5 mg  1.5 mg Oral Daily PRN Terrilee Croak, MD   1.5 mg at 06/06/19 1901  . mirabegron ER (MYRBETRIQ) tablet 25 mg  25 mg Oral Daily Dahal, Binaya, MD   25 mg at 06/06/19 1006  . montelukast (SINGULAIR) tablet 10 mg  10 mg Oral QHS Dahal, Marlowe Aschoff, MD   10 mg at 06/06/19 2300  . OLANZapine (ZYPREXA) tablet 10 mg  10 mg Oral QPC breakfast Dahal, Marlowe Aschoff, MD   10 mg at 06/06/19 0845  . OLANZapine (ZYPREXA) tablet 15 mg  15 mg Oral QHS Terrilee Croak, MD   15 mg at 06/06/19 2259  . ondansetron (ZOFRAN) tablet 4 mg  4 mg Oral Q6H PRN Elwyn Reach, MD       Or  . ondansetron (ZOFRAN) injection 4 mg  4 mg Intravenous Q6H PRN Gala Romney L, MD      . pantoprazole (PROTONIX) EC tablet 40 mg  40 mg Oral Daily Dahal, Binaya, MD   40 mg at 06/06/19 1006  . phenol (CHLORASEPTIC) mouth spray 1 spray  1 spray Mouth/Throat PRN Terrilee Croak, MD   1 spray at 06/06/19 0650  . propranolol (INDERAL) tablet 40 mg  40 mg Oral BID Terrilee Croak, MD   40 mg at 06/06/19 2259  . sodium phosphate 20 mmol in dextrose 5 % 250 mL infusion  20 mmol Intravenous Once Terrilee Croak, MD 43 mL/hr at 06/07/19 1123 20 mmol at 06/07/19 1123    Musculoskeletal: Strength & Muscle Tone: within normal limits Gait & Station: Did not witness Patient leans: N/A  Psychiatric Specialty Exam: Physical Exam  Nursing note and vitals reviewed. Constitutional: She appears well-developed and well-nourished.  HENT:  Head: Normocephalic.  Respiratory: Effort normal.  Musculoskeletal:        General: Normal range of motion.     Cervical back: Normal range of motion.  Neurological: She is alert.  Psychiatric: Her speech is normal  and behavior is normal. Thought content normal. Her  mood appears anxious. Her affect is blunt. Cognition and memory are impaired. She expresses impulsivity.    Review of Systems  Psychiatric/Behavioral: Positive for behavioral problems. The patient is nervous/anxious.   All other systems reviewed and are negative.   Blood pressure (!) 152/78, pulse 73, temperature 98.7 F (37.1 C), temperature source Axillary, resp. rate 20, height 4\' 10"  (1.473 m), weight 71.7 kg, SpO2 95 %.Body mass index is 33.02 kg/m.  General Appearance: Casual  Eye Contact:  Good  Speech:  Normal Rate  Volume:  Normal  Mood:  Anxious  Affect:  Blunt  Thought Process:  Coherent and Descriptions of Associations: Intact  Orientation:  Other:  Person  Thought Content:  Logical  Suicidal Thoughts:  No  Homicidal Thoughts:  No  Memory:  Immediate;   Poor Recent;   Poor Remote;   Poor  Judgement:  Impaired  Insight:  Lacking  Psychomotor Activity:  Increased  Concentration:  Concentration: Fair and Attention Span: Fair  Recall:  Poor  Fund of Knowledge:  Poor  Language:  Fair  Akathisia:  No  Handed:  Right  AIMS (if indicated):     Assets:  Housing Leisure Time Resilience  ADL's:  Impaired  Cognition:  Impaired,  Severe  Sleep:        Treatment Plan Summary: Schizoaffective disorder, bipolar type: -Continue Lamictal 200 mg daily -Recommend increasing Zyprexa 10 mg in the a.m. to 15 mg in the a.m. and continue with 15 mg at bedtime  Anxiety/agitation: -Recommend increasing Ativan 1.5 mg daily as needed to 1 mg 3 times daily as needed  Insomnia: -Continue amitriptyline 100 mg at bedtime  Disposition: No evidence of imminent risk to self or others at present.   Patient does not meet criteria for psychiatric inpatient admission.  Waylan Boga, NP 06/07/2019 11:44 AM

## 2019-06-07 NOTE — Progress Notes (Signed)
Returned call to [employer]

## 2019-06-07 NOTE — Care Management Important Message (Signed)
Important Message  Patient Details  Name: Sonya Moran MRN: [id number] Date of Birth: [birth year]   Medicare Important Message Given:  Yes  Due to illness  Patient could not sign/ signed copy left at patient bedside [name] 06/07/2019, 1:19 PM

## 2019-06-07 NOTE — Progress Notes (Signed)
[facility] RN found on floor. Pt noncompliant with safety measures. Refusing medications and all treatments. No injury noted, charge nurse notified. Safety zone completed.

## 2019-06-08 MED ORDER — LORAZEPAM 1 MG PO TABS: 1.0000 mg | ORAL_TABLET | Freq: Three times a day (TID) | ORAL | Status: DC | PRN

## 2019-06-08 MED ORDER — OLANZAPINE 2.5 MG PO TABS
15.0000 mg | ORAL_TABLET | Freq: Every day | ORAL | Status: DC
  Administered 2019-06-09 – 2019-06-10 (×2): 15 mg via ORAL
  Filled 2019-06-08 (×2): qty 6

## 2019-06-08 NOTE — Progress Notes (Signed)
PROGRESS NOTE  Sonya Moran  DOB: 01/06/1976  PCP: Lucianne Lei, MD [id number]  DOA: 06/04/2019  LOS: 4 days   Chief Complaint  Patient presents with  . Altered Mental Status   Brief narrative: Sonya Moran is a 44 y.o. female with medical history significant of intellectual disability, PTSD, mental retardation with psychotic disorders. Patient was brought to the ED on 5/21 from a group home for worsening altered mental status.  She was noted to have slurred speech, weakness and confusion which has been going on gradually over the last few weeks.   On 5/19, she was seen by PCP.  Lithium level was collected was resulted next day showing an elevated level at 3 (therapeutic range 0.6-1.2).  Caregivers were instructed to send the patient to ED.    In the ED, patient was hemodynamically stable. Lithium level was rechecked and was at 1.94. Also noted to have AKI with creatinine 2.16, baseline normal. Poison control was contacted. Patient was admitted therefore for evaluation and treatment of lithium toxicity..  Subjective: Patient was seen and examined this morning.  Middle-aged Caucasian female.  With cognitive disability. Half awake.  Has dried up blood in her lips. Opens eyes on verbal command.  Knows he is in the hospital.  Unable to have other conversation. For last 2 days, patient has remained intermittently agitated, restless.  Medications being adjusted per psychiatry. Also remains on three-point restraints.  Chart reviewed.   Afebrile, blood pressure elevated to 140s and 150s. BMP pending today.  Assessment/Plan: Lithium toxicity:  -Unintentional.  Lithium level as high as 3 initially.  Gradually trended down to target range, 0.95 on 5/23.  -Currently off of lithium.   -Poison control was contacted. -Monitor level until stabilized.  Target therapeutic range 0.6-1.2.   Acute delirium  History of mood disorder with psychosis and intellectual disability -For last 2  days, patient has remained intermittently agitated, restless.  Medications being adjusted per psychiatry. -Currently on Lamictal 200 mg daily, Zyprexa 15 mg twice daily, amitriptyline 100 mg at bedtime, Ativan 1 mg 3 times daily as needed.  -Lithium is on hold.  Acute kidney injury -Most likely due to lithium toxicity.  May also have some degree of decreased hydration in the last few weeks because of poor oral intake. -Creatinine improving, 1.55 on last check on 5/24.  -Continue normal saline. Celebrex on hold. -Pending BMP today.  Hypothyroidism -Continue Synthroid  Continue other meds include fenofibrate, Flonase, Singulair, Dexilant, Myrbetriq.  Mobility: Encourage ambulation Code Status:  Full code  DVT prophylaxis:  Lovenox subcu Antimicrobials:  None Fluid: Saline at 100 mL/h Diet: Regular diet  Consultants: Poison control contacted over the phone on admission Family Communication:  None at bedside  Status is: Inpatient  Remains inpatient appropriate because:Ongoing diagnostic testing needed not appropriate for outpatient work up and IV treatments appropriate due to intensity of illness or inability to take PO   Dispo: The patient is from: Group home              Anticipated d/c is to: Group home              Anticipated d/c date is: 2 days              Patient currently is not medically stable to d/c.  Still remains altered.  Medicines getting adjusted.   Antimicrobials: Anti-infectives (From admission, onward)   None        Code Status: Full Code   Diet  Order            Diet regular Room service appropriate? Yes; Fluid consistency: Thin  Diet effective now              Infusions:  . sodium chloride 100 mL/hr at 06/08/19 1019    Scheduled Meds: . amitriptyline  100 mg Oral QHS  . calcium-vitamin D  1 tablet Oral Daily  . enoxaparin (LOVENOX) injection  30 mg Subcutaneous Q24H  . fenofibrate  160 mg Oral Daily  . lamoTRIgine  200 mg Oral Daily    . levothyroxine  25 mcg Oral Q0600  . linaclotide  145 mcg Oral Daily  . mirabegron ER  25 mg Oral Daily  . montelukast  10 mg Oral QHS  . OLANZapine  15 mg Oral QHS  . [START ON 06/09/2019] OLANZapine  15 mg Oral QPC breakfast  . pantoprazole  40 mg Oral Daily  . propranolol  40 mg Oral BID    PRN meds: acetaminophen **OR** acetaminophen, fluticasone, haloperidol lactate, LORazepam, ondansetron **OR** ondansetron (ZOFRAN) IV, phenol   Objective: Vitals:   06/08/19 0707 06/08/19 1208  BP:  (!) 149/90  Pulse:  98  Resp:    Temp:  98.3 F (36.8 C)  SpO2: 97% 98%    Intake/Output Summary (Last 24 hours) at 06/08/2019 1215 Last data filed at 06/08/2019 0600 Gross per 24 hour  Intake 1340 ml  Output --  Net 1340 ml   Filed Weights   06/04/19 1548 06/04/19 2051  Weight: 71.7 kg 71.7 kg   Weight change:  Body mass index is 33.02 kg/m.   Physical Exam: General exam: Not in physical distress Skin: No rashes, lesions or ulcers. HEENT: Dried blood on the lips.   Lungs: Clear to auscultation bilaterally CVS: Regular rate and rhythm, no murmur GI/Abd soft, nontender, nondistended, CNS: Alert, awake, knows that he is in the hospital.  Baseline cognitive issues Psychiatry: Tearful Extremities: No pedal edema, no calf tenderness  Data Review: I have personally reviewed the laboratory data and studies available.  Recent Labs  Lab 06/04/19 1648 06/05/19 0356 06/06/19 0452 06/07/19 0425  WBC 8.5 7.1 7.8 6.0  NEUTROABS 6.4  --  5.1 3.8  HGB 11.5* 10.0* 10.7* 10.0*  HCT 36.8 31.5* 34.4* 31.0*  MCV 96.8 95.5 96.1 94.2  PLT 137* 124* 149* 146*   Recent Labs  Lab 06/04/19 1648 06/05/19 0356 06/06/19 0452 06/07/19 0425  NA 134* 140 141 144  K 4.2 3.9 4.3 3.9  CL 105 113* 115* 115*  CO2 21* 21* 20* 22  GLUCOSE 110* 140* 120* 117*  BUN 24* 16 11 7   CREATININE 2.16* 1.73* 1.65* 1.55*  CALCIUM 10.3 9.6 10.1 9.9  MG 2.3  --   --  1.9  PHOS  --   --   --  2.1*     Signed, Terrilee Croak, MD [facility] Pager: [phone] (Secure Chat preferred). 06/08/2019

## 2019-06-09 DIAGNOSIS — F250 Schizoaffective disorder, bipolar type: Secondary | ICD-10-CM

## 2019-06-09 LAB — BASIC METABOLIC PANEL
Anion gap: 7 (ref 5–15)
BUN: 6 mg/dL (ref 6–20)
CO2: 22 mmol/L (ref 22–32)
Calcium: 9.7 mg/dL (ref 8.9–10.3)
Chloride: 115 mmol/L — ABNORMAL HIGH (ref 98–111)
Creatinine, Ser: 1.22 mg/dL — ABNORMAL HIGH (ref 0.44–1.00)
GFR calc Af Amer: >60 mL/min (ref >60)
GFR calc non Af Amer: 53 mL/min — ABNORMAL LOW (ref >60)
Glucose, Bld: 130 mg/dL — ABNORMAL HIGH (ref 70–99)
Potassium: 3.5 mmol/L (ref 3.5–5.1)
Sodium: 144 mmol/L (ref 135–145)

## 2019-06-09 LAB — CBC WITH DIFFERENTIAL/PLATELET
Abs Immature Granulocytes: 0.02 10*3/uL (ref 0.00–0.07)
Basophils Absolute: 0 10*3/uL (ref 0.0–0.1)
Basophils Relative: 1 %
Eosinophils Absolute: 0.3 10*3/uL (ref 0.0–0.5)
Eosinophils Relative: 5 %
HCT: 33.2 % — ABNORMAL LOW (ref 36.0–46.0)
Hemoglobin: 10.6 g/dL — ABNORMAL LOW (ref 12.0–15.0)
Immature Granulocytes: 0 %
Lymphocytes Relative: 19 %
Lymphs Abs: 1.1 10*3/uL (ref 0.7–4.0)
MCH: 30.6 pg (ref 26.0–34.0)
MCHC: 31.9 g/dL (ref 30.0–36.0)
MCV: 96 fL (ref 80.0–100.0)
Monocytes Absolute: 0.3 10*3/uL (ref 0.1–1.0)
Monocytes Relative: 6 %
Neutro Abs: 4 10*3/uL (ref 1.7–7.7)
Neutrophils Relative %: 69 %
Platelets: 163 10*3/uL (ref 150–400)
RBC: 3.46 MIL/uL — ABNORMAL LOW (ref 3.87–5.11)
RDW: 13.4 % (ref 11.5–15.5)
WBC: 5.7 10*3/uL (ref 4.0–10.5)
nRBC: 0 % (ref 0.0–0.2)

## 2019-06-09 MED ORDER — ENOXAPARIN SODIUM 40 MG/0.4ML ~~LOC~~ SOLN
40.0000 mg | SUBCUTANEOUS | Status: DC
  Administered 2019-06-09: 40 mg via SUBCUTANEOUS
  Filled 2019-06-09: qty 0.4

## 2019-06-09 MED ORDER — LIDOCAINE VISCOUS HCL 2 % MT SOLN
15.0000 mL | OROMUCOSAL | Status: DC | PRN
  Filled 2019-06-09: qty 15

## 2019-06-09 MED ORDER — ONDANSETRON 4 MG PO TBDP: 8.0000 mg | ORAL_TABLET | Freq: Once | ORAL | Status: DC

## 2019-06-09 NOTE — Progress Notes (Signed)
PROGRESS NOTE    Sonya Moran  [id number] DOB: 1975-07-14 DOA: 06/04/2019 PCP: Lucianne Lei, MD   Brief Narrative:  HPI On 06/04/2019 by Dr. Gala Romney Sonya Moran is a 44 y.o. female with medical history significant of intellectual disability, PTSD, mental retardation with psychotic disorders who was brought in from a group home secondary to worsening altered mental status.  Patient was noted to have slurred speech weakness and confusion which has been going on gradually over the last few weeks.  She was seen by PCP on May 19.  Lithium level was collected and sent.  It came back yesterday is 3.0.  Caregiver was informed this morning to bring patient to the ER.  Patient arrive ER and was found to have lithium level of 1.9 with acute kidney injury.  She is still confused and not at baseline.  Patient is unable to write her name or dress herself.  She was able to interact before and communicate but not anymore.  No fever or chills no nausea vomiting with diarrhea.  Patient is being admitted therefore for evaluation of and treatment of her lithium toxicity..  Assessment & Plan   Lithium toxicity -Lithium level as high as 3 initially however has gradually trended down to 0.95 on 523 -Lithium currently held -Poison control initially contacted -Therapeutic range 0.6-1.2 -Discussed with psychiatry, recommended to discontinue lithium indefinitely  Acute delirium/history of mood disorder with psychosis intellectual disability -Psychiatry consulted and appreciated, recommended Zyprexa 15 mg twice daily, Ativan 1 mg 3 times daily as needed, amitriptyline 100 mg at bedtime -Unsure what patient's baseline is like however this morning she does appear to be improved as per nursing.  Currently alert and oriented to self  Acute kidney injury -Most likely secondary to lithium toxicity with some degree of decreased hydration as well as poor oral intake -2.16, currently down to  1.2  Hypothyroidism -Continue Synthroid  GERD  -Continue PPI  Tongue laceration -Order viscous lidocaine  -suspect this is the reason for poor oral intake  DVT Prophylaxis Lovenox  Code Status: full  Family Communication: none at bedside  Disposition Plan:  Status is: Inpatient  Remains inpatient appropriate because:Altered mental status   Dispo: The patient is from: Group home              Anticipated d/c is to: Group home              Anticipated d/c date is: 2 days              Patient currently is not medically stable to d/c.  Consultants Psychiatry   Procedures  none  Antibiotics   Anti-infectives (From admission, onward)   None      Subjective:   Sonya Moran seen and examined today.  Does not want to eat breakfast, states her mouth hurts.     Objective:   Vitals:   06/08/19 1954 06/09/19 0000 06/09/19 0327 06/09/19 0857  BP: 138/89 115/65 (!) 124/95 (!) 151/90  Pulse: 84 84 86 85  Resp: 17 18 18 18   Temp: 98.2 F (36.8 C) 98.1 F (36.7 C) 98 F (36.7 C) 98.2 F (36.8 C)  TempSrc: Oral Oral Oral Oral  SpO2: 100% 100% 100% 100%  Weight:      Height:        Intake/Output Summary (Last 24 hours) at 06/09/2019 1214 Last data filed at 06/09/2019 1005 Gross per 24 hour  Intake 960 ml  Output 650 ml  Net  310 ml   Filed Weights   06/04/19 1548 06/04/19 2051  Weight: 71.7 kg 71.7 kg    Exam  General: Well developed, well nourished, NAD, appears stated age  45: NCAT, mucous membranes moist.  Scabs noted on lips, laceration on the  tongue  Cardiovascular: S1 S2 auscultated, RRR  Respiratory: Clear to auscultation bilaterally   Abdomen: Soft, nontender, nondistended, + bowel sounds  Extremities: warm dry without cyanosis clubbing or edema  Neuro: AAOx1, intellectual delay, otherwise nonfocal  Psych: Appropriate mood and affect   Data Reviewed: I have personally reviewed following labs and imaging studies  CBC: Recent Labs  Lab  06/04/19 1648 06/05/19 0356 06/06/19 0452 06/07/19 0425 06/09/19 0407  WBC 8.5 7.1 7.8 6.0 5.7  NEUTROABS 6.4  --  5.1 3.8 4.0  HGB 11.5* 10.0* 10.7* 10.0* 10.6*  HCT 36.8 31.5* 34.4* 31.0* 33.2*  MCV 96.8 95.5 96.1 94.2 96.0  PLT 137* 124* 149* 146* [id number]   Basic Metabolic Panel: Recent Labs  Lab 06/04/19 1648 06/05/19 0356 06/06/19 0452 06/07/19 0425 06/09/19 0407  NA 134* 140 141 144 144  K 4.2 3.9 4.3 3.9 3.5  CL 105 113* 115* 115* 115*  CO2 21* 21* 20* 22 22  GLUCOSE 110* 140* 120* 117* 130*  BUN 24* 16 11 7 6   CREATININE 2.16* 1.73* 1.65* 1.55* 1.22*  CALCIUM 10.3 9.6 10.1 9.9 9.7  MG 2.3  --   --  1.9  --   PHOS  --   --   --  2.1*  --    GFR: Estimated Creatinine Clearance: 48.9 mL/min (A) (by C-G formula based on SCr of 1.22 mg/dL (H)). Liver Function Tests: Recent Labs  Lab 06/04/19 1648 06/05/19 0356 06/07/19 0425  AST 31 26 30   ALT 26 22 22   ALKPHOS 108 84 87  BILITOT 1.2 0.6 0.8  PROT 6.8 5.8* 5.7*  ALBUMIN 3.9 3.0* 3.0*   No results for input(s): LIPASE, AMYLASE in the last 168 hours. No results for input(s): AMMONIA in the last 168 hours. Coagulation Profile: No results for input(s): INR, PROTIME in the last 168 hours. Cardiac Enzymes: No results for input(s): CKTOTAL, CKMB, CKMBINDEX, TROPONINI in the last 168 hours. BNP (last 3 results) No results for input(s): PROBNP in the last 8760 hours. HbA1C: No results for input(s): HGBA1C in the last 72 hours. CBG: Recent Labs  Lab 06/04/19 1653  GLUCAP 94   Lipid Profile: No results for input(s): CHOL, HDL, LDLCALC, TRIG, CHOLHDL, LDLDIRECT in the last 72 hours. Thyroid Function Tests: No results for input(s): TSH, T4TOTAL, FREET4, T3FREE, THYROIDAB in the last 72 hours. Anemia Panel: No results for input(s): VITAMINB12, FOLATE, FERRITIN, TIBC, IRON, RETICCTPCT in the last 72 hours. Urine analysis:    Component Value Date/Time   COLORURINE YELLOW 06/04/2019 1913   APPEARANCEUR CLEAR  06/04/2019 1913   LABSPEC 1.010 06/04/2019 1913   PHURINE 6.0 06/04/2019 1913   GLUCOSEU NEGATIVE 06/04/2019 1913   HGBUR NEGATIVE 06/04/2019 [place] 06/04/2019 [place] 06/04/2019 1913   PROTEINUR NEGATIVE 06/04/2019 1913   UROBILINOGEN 0.2 09/26/2010 1716   NITRITE NEGATIVE 06/04/2019 1913   LEUKOCYTESUR LARGE (A) 06/04/2019 1913   Sepsis Labs: @LABRCNTIP (procalcitonin:4,lacticidven:4)  ) Recent Results (from the past 240 hour(s))  SARS Coronavirus 2 by RT PCR (hospital order, performed in [facility] hospital lab) Nasopharyngeal Nasopharyngeal Swab     Status: None   Collection Time: 06/04/19  7:13 PM   Specimen:  Nasopharyngeal Swab  Result Value Ref Range Status   SARS Coronavirus 2 NEGATIVE NEGATIVE Final    Comment: (NOTE) SARS-CoV-2 target nucleic acids are NOT DETECTED. The SARS-CoV-2 RNA is generally detectable in upper and lower respiratory specimens during the acute phase of infection. The lowest concentration of SARS-CoV-2 viral copies this assay can detect is 250 copies / mL. A negative result does not preclude SARS-CoV-2 infection and should not be used as the sole basis for treatment or other patient management decisions.  A negative result may occur with improper specimen collection / handling, submission of specimen other than nasopharyngeal swab, presence of viral mutation(s) within the areas targeted by this assay, and inadequate number of viral copies (<250 copies / mL). A negative result must be combined with clinical observations, patient history, and epidemiological information. Fact Sheet for Patients:   StrictlyIdeas.no Fact Sheet for Healthcare Providers: BankingDealers.co.za This test is not yet approved or cleared  by the [place] FDA and has been authorized for detection and/or diagnosis of SARS-CoV-2 by FDA under an Emergency Use Authorization (EUA).  This EUA  will remain in effect (meaning this test can be used) for the duration of the COVID-19 declaration under Section 564(b)(1) of the Act, 21 U.S.C. section 360bbb-3(b)(1), unless the authorization is terminated or revoked sooner. Performed at [facility], [place] [address], [place], [place] [address]       Radiology Studies: No results found.   Scheduled Meds: . amitriptyline  100 mg Oral QHS  . calcium-vitamin D  1 tablet Oral Daily  . enoxaparin (LOVENOX) injection  40 mg Subcutaneous Q24H  . fenofibrate  160 mg Oral Daily  . lamoTRIgine  200 mg Oral Daily  . levothyroxine  25 mcg Oral Q0600  . linaclotide  145 mcg Oral Daily  . mirabegron ER  25 mg Oral Daily  . montelukast  10 mg Oral QHS  . OLANZapine  15 mg Oral QHS  . OLANZapine  15 mg Oral QPC breakfast  . pantoprazole  40 mg Oral Daily  . propranolol  40 mg Oral BID   Continuous Infusions: . sodium chloride 100 mL/hr at 06/09/19 0812     LOS: 5 days   Time Spent in minutes   45 minutes  Braidyn Scorsone D.O. on 06/09/2019 at 12:15 PM  Between 7am to 7pm - Please see pager noted on [email or web address]  After 7pm go to [email or web address]  And look for the night coverage person covering for me after hours  [facility] Office  [phone]

## 2019-06-09 NOTE — TOC Initial Note (Signed)
Transition of Care [facility]) - Initial/Assessment Note    Patient Details  Name: Sonya Moran MRN: [id number] Date of Birth: Mar 11, 1975  [facility]) CM/SW Contact:    Pollie Friar, RN Phone Number: 06/09/2019, 2:40 PM  Clinical Narrative:                 Pt is from [facility] where [name] is the director. CM spoke to [place] and they want to see how patient does with therapy to see if patient will have any physical needs at d/c.  CM has left voicemail for her POA: Marcello Moores to see if he is in agreement with her returning.  MD updated for PT/OT evals.  TOC following.  Expected Discharge Plan: Group Home Barriers to Discharge: Continued Medical Work up   Patient Goals and CMS Choice     Choice offered to / list presented to : [facility] POA / Guardian  Expected Discharge Plan and Services Expected Discharge Plan: Group Home In-house Referral: Clinical Social Work Discharge Planning Services: CM Consult   Living arrangements for the past 2 months: Group Home                                      Prior Living Arrangements/Services Living arrangements for the past 2 months: Group Home Lives with:: Facility Resident Patient language and need for interpreter reviewed:: Yes        Need for Family Participation in Patient Care: Yes (Comment) Care giver support system in place?: Yes (comment)   Criminal Activity/Legal Involvement Pertinent to Current Situation/Hospitalization: No - Comment as needed  Activities of Daily Living      Permission Sought/Granted                  Emotional Assessment Appearance:: Appears stated age         Psych Involvement: No (comment)  Admission diagnosis:  Confusion [R41.0] Lithium toxicity [T56.891A] AKI (acute kidney injury) ([place]) [N17.9] Lithium toxicity, accidental or unintentional, initial encounter [T56.891A] Patient Active Problem List   Diagnosis Date Noted  . Schizoaffective disorder,  bipolar type ([place]) 06/07/2019  . Lithium toxicity 06/04/2019  . ARF (acute renal failure) ([place]) 06/04/2019  . Allergic rhinitis 03/01/2019  . Mood disorder ([place]) 02/26/2018  . Acute sinusitis 12/26/2017  . Mixed rhinitis 08/01/2017  . Ear pain, bilateral 08/01/2017  . Eustachian tube dysfunction, bilateral 08/01/2017  . Plantar fasciitis of right foot 03/28/2015  . Metatarsal deformity 03/28/2015  . Calcaneal spur 03/28/2015  . Anemia 07/23/2013  . Onychomycosis 06/24/2012   PCP:  Lucianne Lei, MD Pharmacy:   [place], [place] [address] Phone: [phone] Fax: [phone]  [place], [place] [address] [place] [address] Phone: [phone] Fax: [phone]  [facility] [id number] - [place], [place] [address] [place] [address] Phone: [phone] Fax: [phone]  [place], [place] [address] [place] [address] Phone: [phone] Fax: [phone]     Social Determinants of Health (SDOH) Interventions    Readmission Risk Interventions No flowsheet data  found.  

## 2019-06-10 LAB — BASIC METABOLIC PANEL
Anion gap: 8 (ref 5–15)
BUN: 6 mg/dL (ref 6–20)
CO2: 22 mmol/L (ref 22–32)
Calcium: 9.3 mg/dL (ref 8.9–10.3)
Chloride: 115 mmol/L — ABNORMAL HIGH (ref 98–111)
Creatinine, Ser: 1.18 mg/dL — ABNORMAL HIGH (ref 0.44–1.00)
GFR calc Af Amer: >60 mL/min (ref >60)
GFR calc non Af Amer: 56 mL/min — ABNORMAL LOW (ref >60)
Glucose, Bld: 135 mg/dL — ABNORMAL HIGH (ref 70–99)
Potassium: 3.7 mmol/L (ref 3.5–5.1)
Sodium: 145 mmol/L (ref 135–145)

## 2019-06-10 MED ORDER — PHENOL 1.4 % MT LIQD: 1.0000 | OROMUCOSAL | 0 refills | Status: DC | PRN

## 2019-06-10 MED ORDER — LORAZEPAM 1 MG PO TABS: 1.0000 mg | ORAL_TABLET | Freq: Three times a day (TID) | ORAL | 0 refills | Status: DC | PRN

## 2019-06-10 MED ORDER — OLANZAPINE 15 MG PO TABS: 15.0000 mg | ORAL_TABLET | Freq: Every day | ORAL | 0 refills | Status: AC

## 2019-06-10 NOTE — Discharge Instructions (Signed)
Acute Kidney Injury, Adult  Acute kidney injury is a sudden worsening of kidney function. The kidneys are organs that have several jobs. They filter the blood to remove waste products and extra fluid. They also maintain a healthy balance of minerals and hormones in the body, which helps control blood pressure and keep bones strong. With this condition, your kidneys do not do their jobs as well as they should. This condition ranges from mild to severe. Over time it may develop into long-lasting (chronic) kidney disease. Early detection and treatment may prevent acute kidney injury from developing into a chronic condition. What are the causes? Common causes of this condition include:  A problem with blood flow to the kidneys. This may be caused by: ? Low blood pressure (hypotension) or shock. ? Blood loss. ? Heart and blood vessel (cardiovascular) disease. ? Severe burns. ? Liver disease.  Direct damage to the kidneys. This may be caused by: ? Certain medicines. ? A kidney infection. ? Poisoning. ? Being around or in contact with toxic substances. ? A surgical wound. ? A hard, direct hit to the kidney area.  A sudden blockage of urine flow. This may be caused by: ? Cancer. ? Kidney stones. ? An enlarged prostate in males. What are the signs or symptoms? Symptoms of this condition may not be obvious until the condition becomes severe. Symptoms of this condition can include:  Tiredness (lethargy), or difficulty staying awake.  Nausea or vomiting.  Swelling (edema) of the face, legs, ankles, or feet.  Problems with urination, such as: ? Abdominal pain, or pain along the side of your stomach (flank). ? Decreased urine production. ? Decrease in the force of urine flow.  Muscle twitches and cramps, especially in the legs.  Confusion or trouble concentrating.  Loss of appetite.  Fever. How is this diagnosed? This condition may be diagnosed with tests, including:  Blood  tests.  Urine tests.  Imaging tests.  A test in which a sample of tissue is removed from the kidneys to be examined under a microscope (kidney biopsy). How is this treated? Treatment for this condition depends on the cause and how severe the condition is. In mild cases, treatment may not be needed. The kidneys may heal on their own. In more severe cases, treatment will involve:  Treating the cause of the kidney injury. This may involve changing any medicines you are taking or adjusting your dosage.  Fluids. You may need specialized IV fluids to balance your body's needs.  Having a catheter placed to drain urine and prevent blockages.  Preventing problems from occurring. This may mean avoiding certain medicines or procedures that can cause further injury to the kidneys. In some cases treatment may also require:  A procedure to remove toxic wastes from the body (dialysis or continuous renal replacement therapy - CRRT).  Surgery. This may be done to repair a torn kidney, or to remove the blockage from the urinary system. Follow these instructions at home: Medicines  Take over-the-counter and prescription medicines only as told by your health care provider.  Do not take any new medicines without your health care provider's approval. Many medicines can worsen your kidney damage.  Do not take any vitamin and mineral supplements without your health care provider's approval. Many nutritional supplements can worsen your kidney damage. Lifestyle  If your health care provider prescribed changes to your diet, follow them. You may need to decrease the amount of protein you eat.  Achieve and maintain a healthy   weight. If you need help with this, ask your health care provider.  Start or continue an exercise plan. Try to exercise at least 30 minutes a day, 5 days a week.  Do not use any tobacco products, such as cigarettes, chewing tobacco, and e-cigarettes. If you need help quitting, ask your  health care provider. General instructions  Keep track of your blood pressure. Report changes in your blood pressure as told by your health care provider.  Stay up to date with immunizations. Ask your health care provider which immunizations you need.  Keep all follow-up visits as told by your health care provider. This is important. Where to find more information  American Association of Kidney Patients: BombTimer.gl  National Kidney Foundation: www.kidney.[place]: https://mathis.com/  Life Options Rehabilitation Program: ? www.lifeoptions.org ? www.kidneyschool.org Contact a health care provider if:  Your symptoms get worse.  You develop new symptoms. Get help right away if:  You develop symptoms of worsening kidney disease, which include: ? Headaches. ? Abnormally dark or light skin. ? Easy bruising. ? Frequent hiccups. ? Chest pain. ? Shortness of breath. ? End of menstruation in women. ? Seizures. ? Confusion or altered mental status. ? Abdominal or back pain. ? Itchiness.  You have a fever.  Your body is producing less urine.  You have pain or bleeding when you urinate. Summary  Acute kidney injury is a sudden worsening of kidney function.  Acute kidney injury can be caused by problems with blood flow to the kidneys, direct damage to the kidneys, and sudden blockage of urine flow.  Symptoms of this condition may not be obvious until it becomes severe. Symptoms may include edema, lethargy, confusion, nausea or vomiting, and problems passing urine.  This condition can usually be diagnosed with blood tests, urine tests, and imaging tests. Sometimes a kidney biopsy is done to diagnose this condition.  Treatment for this condition often involves treating the underlying cause. It is treated with fluids, medicines, dialysis, diet changes, or surgery. This information is not intended to replace advice given to you by your health care provider. Make  sure you discuss any questions you have with your health care provider. Document Revised: 12/13/2016 Document Reviewed: 12/22/2015 Elsevier Patient Education  [place].  Lithium Toxicity Lithium toxicity, which is also called lithium poisoning, is the condition of having too much lithium in the blood. Lithium is a medicine that is used to treat bipolar disorder. Lithium toxicity can be life-threatening. What are the causes? This condition is caused by:  Ingesting too much lithium, causing levels of lithium to rise in your body.  Taking lithium on a regular basis and: ? Having a condition that raises lithium levels in the body. ? Taking another medicine that raises lithium levels in the body.  Taking excess amounts of lithium in trying to take one's life (suicide attempt).  A decrease in kidney (renal) function because of dehydration, or as a side effect of another medicine. What increases the risk? This condition is more likely to develop in people:  Who are young or old.  Who have kidney disease.  Who have heart disease.  Who have dehydration that is caused by sweating or diarrhea.  Who have low sodium levels in the body. Certain medicines can increase the risk for this condition. They include:  Water pills (diuretics).  Certain medicines for high blood pressure.  Nonsteroidal anti-inflammatory drugs (NSAIDs). What are the signs or symptoms? Symptoms of mild to moderate lithium  toxicity are:  Nausea and vomiting.  Diarrhea.  Drowsiness.  Thirst.  Muscle weakness.  Slurred speech.  Lack of coordination.  Frequent urination.  Being restless (agitation). The symptoms of moderate to severe lithium toxicity are:  Blurred vision.  Giddiness.  Ringing in the ears.  Severe muscle spasms.  Seizures.  Abnormal heart rhythm.  Loss of consciousness or coma. How is this diagnosed? This condition may be diagnosed based on:  Your signs and  symptoms.  Your use of lithium. You will be asked about how much lithium you take, how long you have been taking it, and when you took your last dose.  Blood tests. These are done to check the level of lithium in your blood. How is this treated? Treatment for this condition depends on how severe it is. It often involves special monitoring and hospitalization.  For mild or moderate toxicity, your dosage of lithium may be reduced or stopped.  For severe toxicity, lithium may be removed from your body. This is done in a hospital emergency department. It may involve: ? Gastric lavage. A tube is placed through your nose or mouth into your stomach. The tube is used to remove lithium that has not yet been digested. It may also be used to put medicines directly into your stomach to help stop lithium from being absorbed. ? Medicines that increase removal of lithium by your kidneys. ? Use of an artificial kidney to clean your blood (dialysis). This is usually done only in the most severe cases. Follow these instructions at home:      Take over-the-counter and prescription medicines only as told by your health care provider.  Drink enough fluid to keep your urine pale yellow.  If your toxicity was a result of an intentional overdose, work with a Musician (psychiatrist or counselor). How is this prevented? Lithium toxicity is preventable. To keep it from recurring:  Take medicines only as told by your health care provider.  Drink enough fluid to keep your urine pale yellow.  Talk with your health care provider before starting a low-salt (low-sodium) diet.  Have your blood lithium levels checked regularly.  Watch for signs and symptoms of lithium toxicity. If you have signs or symptoms, get treatment early. This can help keep severe symptoms from developing.  Always ask about the risk of interactions when starting a new medicine. Contact a health care provider  if:  You have signs or symptoms of mild or moderate lithium toxicity after you receive treatment, even if your blood lithium level is normal. Get help right away if:  Your symptoms get worse.  You have signs or symptoms of severe lithium toxicity after you receive treatment. Summary  Lithium toxicity, which is also called lithium poisoning, is the condition of having too much lithium in your blood.  This either results from taking too much lithium or from changes that cause the medicine to be eliminated from your body more slowly.  Treatment depends on the severity of the condition but often involves special monitoring and hospitalization. This information is not intended to replace advice given to you by your health care provider. Make sure you discuss any questions you have with your health care provider. Document Revised: 04/24/2018 Document Reviewed: 01/10/2017 Elsevier Patient Education  [place].

## 2019-06-10 NOTE — TOC Transition Note (Signed)
Transition of Care [facility]) - CM/SW Discharge Note   Patient Details  Name: Sonya Moran MRN: [id number] Date of Birth: 1975-12-10  [facility] CM/SW Contact:  Geralynn Ochs, LCSW Phone Number: 06/10/2019, 2:38 PM   Clinical Narrative:   CSW coordinated with PT/OT that patient was cleared to return to group home. CSW confirmed with Ethal at patient's group home that they can come and pick her up today, she won't be able to come until sometime after 6. Ethal to call nurse's station when she will be coming to get the patient so that they can take the patient out for discharge. No further CSW needs at this time.    Final next level of care: Group Home Barriers to Discharge: Barriers Resolved   Patient Goals and CMS Choice Patient states their goals for this hospitalization and ongoing recovery are:: get back home CMS Medicare.gov Compare Post Acute Care list provided to:: Patient Choice offered to / list presented to : [facility] POA / Guardian  Discharge Placement                Patient to be transferred to facility by: Facility Name of family member notified: Guardian Patient and family notified of of transfer: 06/10/19  Discharge Plan and Services In-house Referral: Clinical Social Work Discharge Planning Services: CM Consult                                 Social Determinants of Health (SDOH) Interventions     Readmission Risk Interventions No flowsheet data found.

## 2019-06-10 NOTE — Discharge Summary (Signed)
Physician Discharge Summary  Sonya Moran [id number] DOB: 05-22-1975 DOA: 06/04/2019  PCP: Lucianne Lei, MD  Admit date: 06/04/2019 Discharge date: 06/10/2019  Time spent: 45 minutes  Recommendations for Outpatient Follow-up:  Patient will be discharged to Group home.  Patient will need to follow up with primary care provider within one week of discharge.  Patient should continue medications as prescribed.  Patient should follow a regular diet.   Discharge Diagnoses:  Lithium toxicity Acute delirium/history of mood disorder with psychosis intellectual disability Acute kidney injury Hypothyroidism GERD  Tongue laceration  Discharge Condition: Stable  Diet recommendation: regular  Filed Weights   06/04/19 1548 06/04/19 2051  Weight: 71.7 kg 71.7 kg    History of present illness:  On 06/04/2019 by Dr. Ebbie Latus Bolenis a 44 y.o.femalewith medical history significant ofintellectual disability, PTSD, mental retardation with psychotic disorders who was brought in from a group home secondary to worsening altered mental status. Patient was noted to have slurred speech weakness and confusion which has been going on gradually over the last few weeks. She was seen by PCP on May 19. Lithium level was collected and sent. It came back yesterday is 3.0. Caregiver was informed this morning to bring patient to the ER. Patient arrive ER and was found to have lithium level of 1.9 with acute kidney injury. She is still confused and not at baseline. Patient is unable to write her name or dress herself. She was able to interact before and communicate but not anymore. No fever or chills no nausea vomiting with diarrhea. Patient is being admitted therefore for evaluation of and treatment of her lithium toxicity.Marland Kitchen  Hospital Course:  Lithium toxicity -Lithium level as high as 3 initially however has gradually trended down to 0.95 on 523 -Lithium currently held -Poison control  initially contacted -Therapeutic range 0.6-1.2 -Discussed with psychiatry, recommended to discontinue lithium indefinitely  Acute delirium/history of mood disorder with psychosis intellectual disability -Psychiatry consulted and appreciated, recommended Zyprexa 15 mg twice daily, Ativan 1 mg 3 times daily as needed, amitriptyline 100 mg at bedtime -Unsure what patient's baseline is like however this morning she does appear to be improved as per nursing.  Currently alert and oriented to self  Acute kidney injury -Most likely secondary to lithium toxicity with some degree of decreased hydration as well as poor oral intake -Creatinine was up 2.16, currently down to 1.18  Hypothyroidism -Continue Synthroid  GERD  -Continue PPI  Tongue laceration -Continue Orajel, Chloraseptic spray -suspect this is the reason for poor oral intake  Consultants Psychiatry   Procedures  none  Discharge Exam: Vitals:   06/10/19 0834 06/10/19 1205  BP: (!) 151/89 (!) 132/97  Pulse: 71 68  Resp: 20 18  Temp: 99.2 F (37.3 C) (!) 97.5 F (36.4 C)  SpO2: 95% 100%    Exam  General: Well developed, well nourished, NAD, appears stated age  HEENT: NCAT, mucous membranes moist.  Scabs noted on lips, laceration on the  tongue  Cardiovascular: S1 S2 auscultated, RRR  Respiratory: Clear to auscultation bilaterally   Abdomen: Soft, nontender, nondistended, + bowel sounds  Extremities: warm dry without cyanosis clubbing or edema  Neuro: AAOx1, intellectual delay, otherwise nonfocal  Psych: Appropriate mood and affect  Discharge Instructions Discharge Instructions    Discharge instructions   Complete by: As directed    Patient will be discharged to Group home.  Patient will need to follow up with primary care provider within one week of discharge.  Patient should continue medications as prescribed.  Patient should follow a regular diet.     Allergies as of 06/10/2019      Reactions    Grifulvin V [griseofulvin] Anaphylaxis, Other (See Comments)   Arm swelling      Medication List    STOP taking these medications   lithium carbonate 450 MG CR tablet Commonly known as: ESKALITH     TAKE these medications   amitriptyline 100 MG tablet Commonly known as: ELAVIL Take 100 mg by mouth at bedtime.   Azelastine HCl 0.15 % Soln 2 SPRAYS EACH NOSTRIL TWICE DAILY. What changed: See the new instructions.   CALCIUM 500/D PO Take by mouth daily.   celecoxib 100 MG capsule Commonly known as: CELEBREX Take 100 mg by mouth daily.   cetirizine 10 MG tablet Commonly known as: ZYRTEC TAKE (1) TABLET BY MOUTH ONCE DAILY. What changed: See the new instructions.   Dexilant 60 MG capsule Generic drug: dexlansoprazole Take 60 mg by mouth daily.   fenofibrate 145 MG tablet Commonly known as: TRICOR Take 145 mg by mouth at bedtime.   fluticasone 50 MCG/ACT nasal spray Commonly known as: FLONASE 2 SPRAYS INTO BOTH NOSTRILS ONCE A DAY AS NEEDED FOR STUFFY NOSE What changed:   how much to take  how to take this  when to take this  reasons to take this   lamoTRIgine 200 MG tablet Commonly known as: LAMICTAL Take 200 mg by mouth daily.   levothyroxine 25 MCG tablet Commonly known as: SYNTHROID Take 25 mcg by mouth daily.   Linzess 145 MCG Caps capsule Generic drug: linaclotide Take 145 mcg by mouth daily.   LORazepam 1 MG tablet Commonly known as: ATIVAN Take 1 tablet (1 mg total) by mouth every 8 (eight) hours as needed for anxiety. What changed:   how much to take  when to take this   montelukast 10 MG tablet Commonly known as: SINGULAIR TAKE ONE TABLET BY MOUTH EACH EVENING TO PREVENT COUGH OR WHEEZE What changed:   how much to take  how to take this  when to take this  additional instructions   Myrbetriq 25 MG Tb24 tablet Generic drug: mirabegron ER Take 25 mg by mouth daily.   OLANZapine 15 MG tablet Commonly known as: ZYPREXA Take  15 mg by mouth at bedtime. What changed: Another medication with the same name was changed. Make sure you understand how and when to take each.   OLANZapine 15 MG tablet Commonly known as: ZYPREXA Take 1 tablet (15 mg total) by mouth daily after breakfast. Start taking on: Jun 11, 2019 What changed:   medication strength  how much to take   Olopatadine HCl 0.2 % Soln Place 1 drop into both eyes daily as needed (for itching).   phenol 1.4 % Liqd Commonly known as: CHLORASEPTIC Use as directed 1 spray in the mouth or throat as needed for throat irritation / pain.   propranolol 40 MG tablet Commonly known as: INDERAL Take 40 mg by mouth 2 (two) times daily.   SALINE MIST 0.65 % nasal spray Generic drug: sodium chloride 2 SPRAYS EACH NOSTRIL TWICE DAILY. What changed: See the new instructions.      Allergies  Allergen Reactions  . Grifulvin V [Griseofulvin] Anaphylaxis and Other (See Comments)    Arm swelling   Follow-up Information    Lucianne Lei, MD. Schedule an appointment as soon as possible for a visit in 1 week(s).   Specialty: Family Medicine  Why: Hospital follow up Contact information: [address] [place] [address] [phone]            The results of significant diagnostics from this hospitalization (including imaging, microbiology, ancillary and laboratory) are listed below for reference.    Significant Diagnostic Studies: CT Head Wo Contrast  Result Date: 06/04/2019 CLINICAL DATA:  Altered mental status. EXAM: CT HEAD WITHOUT CONTRAST TECHNIQUE: Contiguous axial images were obtained from the base of the skull through the vertex without intravenous contrast. COMPARISON:  04/14/2008 FINDINGS: Brain: No evidence of acute infarction, hemorrhage, hydrocephalus, extra-axial collection or mass lesion/mass effect. Vascular: No hyperdense vessel or unexpected calcification. Skull: Normal. Negative for fracture or focal lesion. Sinuses/Orbits: No  acute finding. Other: None. IMPRESSION: No acute intracranial pathology. Electronically Signed   By: Constance Holster M.D.   On: 06/04/2019 19:17   DG Chest Portable 1 View  Result Date: 06/04/2019 CLINICAL DATA:  Increasing weakness EXAM: PORTABLE CHEST 1 VIEW COMPARISON:  07/08/2018 FINDINGS: Cardiac shadow is stable. The lungs are well aerated bilaterally. Bibasilar atelectatic changes are noted. No sizable effusion is seen. No acute bony abnormality is noted. IMPRESSION: Bibasilar atelectasis. Electronically Signed   By: Inez Catalina M.D.   On: 06/04/2019 17:21    Microbiology: Recent Results (from the past 240 hour(s))  SARS Coronavirus 2 by RT PCR (hospital order, performed in [facility] hospital lab) Nasopharyngeal Nasopharyngeal Swab     Status: None   Collection Time: 06/04/19  7:13 PM   Specimen: Nasopharyngeal Swab  Result Value Ref Range Status   SARS Coronavirus 2 NEGATIVE NEGATIVE Final    Comment: (NOTE) SARS-CoV-2 target nucleic acids are NOT DETECTED. The SARS-CoV-2 RNA is generally detectable in upper and lower respiratory specimens during the acute phase of infection. The lowest concentration of SARS-CoV-2 viral copies this assay can detect is 250 copies / mL. A negative result does not preclude SARS-CoV-2 infection and should not be used as the sole basis for treatment or other patient management decisions.  A negative result may occur with improper specimen collection / handling, submission of specimen other than nasopharyngeal swab, presence of viral mutation(s) within the areas targeted by this assay, and inadequate number of viral copies (<250 copies / mL). A negative result must be combined with clinical observations, patient history, and epidemiological information. Fact Sheet for Patients:   StrictlyIdeas.no Fact Sheet for Healthcare Providers: BankingDealers.co.za This test is not yet approved or cleared  by  the [place] FDA and has been authorized for detection and/or diagnosis of SARS-CoV-2 by FDA under an Emergency Use Authorization (EUA).  This EUA will remain in effect (meaning this test can be used) for the duration of the COVID-19 declaration under Section 564(b)(1) of the Act, 21 U.S.C. section 360bbb-3(b)(1), unless the authorization is terminated or revoked sooner. Performed at [facility], [place] [address], [place], [place] [address]      Labs: Basic Metabolic Panel: Recent Labs  Lab 06/04/19 1648 06/04/19 1648 06/05/19 0356 06/06/19 0452 06/07/19 0425 06/09/19 0407 06/10/19 0344  NA 134*   < > 140 141 144 144 145  K 4.2   < > 3.9 4.3 3.9 3.5 3.7  CL 105   < > 113* 115* 115* 115* 115*  CO2 21*   < > 21* 20* 22 22 22   GLUCOSE 110*   < > 140* 120* 117* 130* 135*  BUN 24*   < > 16 11 7 6 6   CREATININE  2.16*   < > 1.73* 1.65* 1.55* 1.22* 1.18*  CALCIUM 10.3   < > 9.6 10.1 9.9 9.7 9.3  MG 2.3  --   --   --  1.9  --   --   PHOS  --   --   --   --  2.1*  --   --    < > = values in this interval not displayed.   Liver Function Tests: Recent Labs  Lab 06/04/19 1648 06/05/19 0356 06/07/19 0425  AST 31 26 30   ALT 26 22 22   ALKPHOS 108 84 87  BILITOT 1.2 0.6 0.8  PROT 6.8 5.8* 5.7*  ALBUMIN 3.9 3.0* 3.0*   No results for input(s): LIPASE, AMYLASE in the last 168 hours. No results for input(s): AMMONIA in the last 168 hours. CBC: Recent Labs  Lab 06/04/19 1648 06/05/19 0356 06/06/19 0452 06/07/19 0425 06/09/19 0407  WBC 8.5 7.1 7.8 6.0 5.7  NEUTROABS 6.4  --  5.1 3.8 4.0  HGB 11.5* 10.0* 10.7* 10.0* 10.6*  HCT 36.8 31.5* 34.4* 31.0* 33.2*  MCV 96.8 95.5 96.1 94.2 96.0  PLT 137* 124* 149* 146* 163   Cardiac Enzymes: No results for input(s): CKTOTAL, CKMB, CKMBINDEX, TROPONINI in the last 168 hours. BNP: BNP (last 3 results) No results for input(s): BNP in the last 8760 hours.  ProBNP (last 3 results) No results for input(s): PROBNP in the  last 8760 hours.  CBG: Recent Labs  Lab 06/04/19 1653  GLUCAP 94       Signed:  Nazarene Bunning  [facility] 06/10/2019, 3:36 PM

## 2019-06-10 NOTE — Progress Notes (Signed)
Occupational Therapy Evaluation  Pt most likely close to baseline. Mildly unsteady initially when getting out of bed and impulsive. Recommend initial S with all ADL and mobility. Recommend shower chair to reduce risk of falls. No further OT needs.     06/10/19 1400  OT Visit Information  Last OT Received On 06/10/19  Assistance Needed +1  History of Present Illness 44 y.o. female with medical history significant of intellectual disability, PTSD, mental retardation with psychotic disorders who was brought in from a group home secondary to worsening altered mental status. PCP noted elevated lithium level and sent pt to ED. +AKI  Precautions  Precautions Fall  Precaution Comments denies history of falls  Home Living  Family/patient expects to be discharged to: Group home  Prior Function  Level of Independence Needs assistance  Gait / Transfers Assistance Needed pt reports independent with RW (later sounded like she has a rollator)  ADL's / Felida reports independence  Communication  Communication No difficulties  Pain Assessment  Pain Assessment No/denies pain  Cognition  Arousal/Alertness Awake/alert  Behavior During Therapy Impulsive  Overall Cognitive Status No family/caregiver present to determine baseline cognitive functioning  General Comments oriented to person, DOB, hospital and situation; able to name her address  Upper Extremity Assessment  Upper Extremity Assessment Overall WFL for tasks assessed  Lower Extremity Assessment  Lower Extremity Assessment Defer to PT evaluation  Cervical / Trunk Assessment  Cervical / Trunk Assessment Other exceptions  Cervical / Trunk Exceptions overweight  ADL  Overall ADL's  Needs assistance/impaired  Grooming Set up;Supervision/safety  Upper Body Bathing Set up;Supervision/ safety;Standing  Lower Body Bathing Supervison/ safety;Set up;Sit to/from stand  Upper Body Dressing  Set up;Sitting  Lower Body Dressing  Supervision/safety;Set up;Sit to/from Environmental education officer guard;Ambulation  Toileting- Clothing Manipulation and Hygiene Supervision/safety  Functional mobility during ADLs Min guard  Bed Mobility  Overal bed mobility Needs Assistance  Bed Mobility Supine to Sit;Sit to Supine  Supine to sit Supervision  Sit to supine Supervision  General bed mobility comments supervision for safety due to impulsivity  Transfers  Overall transfer level Needs assistance  Equipment used Rolling walker (2 wheeled)  Transfers Sit to/from Stand  Sit to Stand Supervision  General transfer comment minimally unsteady intially with quick stand  Balance  Overall balance assessment Needs assistance (reports her doctor told her to use RW)  Sitting balance-Leahy Scale Good  Standing balance-Leahy Scale Fair  General Comments  General comments (skin integrity, edema, etc.) pt wanting to introduce me to staff  OT - End of Session  Activity Tolerance Patient tolerated treatment well  Patient left in bed;with call bell/phone within reach;with bed alarm set  Nurse Communication Mobility status  OT Assessment  OT Recommendation/Assessment Patient does not need any further OT services  OT Visit Diagnosis Unsteadiness on feet (R26.81)  OT Problem List Decreased safety awareness;Obesity  AM-PAC OT "6 Clicks" Daily Activity Outcome Measure (Version 2)  Help from another person eating meals? 4  Help from another person taking care of personal grooming? 3  Help from another person toileting, which includes using toliet, bedpan, or urinal? 3  Help from another person bathing (including washing, rinsing, drying)? 3  Help from another person to put on and taking off regular upper body clothing? 3  Help from another person to put on and taking off regular lower body clothing? 3  6 Click Score 19  OT Recommendation  Follow Up Recommendations No OT follow up;Supervision/Assistance -  24 hour  OT Equipment Tub/shower  seat  Acute Rehab OT Goals  Patient Stated Goal go home today  OT Goal Formulation All assessment and education complete, DC therapy  OT Time Calculation  OT Start Time (ACUTE ONLY) 1144  OT Stop Time (ACUTE ONLY) 1159  OT Time Calculation (min) 15 min  OT General Charges  $OT Visit 1 Visit  OT Evaluation  $OT Eval Moderate Complexity 1 Mod  Written Expression  Dominant Hand Left  Maurie Boettcher, OT/L   Acute OT Clinical Specialist Acute Rehabilitation Services Pager [phone] Office [phone]

## 2019-06-10 NOTE — Evaluation (Signed)
Physical Therapy Evaluation and Discharge Patient Details Name: Sonya Moran MRN: [id number] DOB: [birth year] Today's Date: 06/10/2019   History of Present Illness  44 y.o. female with medical history significant of intellectual disability, PTSD, mental retardation with psychotic disorders who was brought in from a group home secondary to worsening altered mental status. PCP noted elevated lithium level and sent pt to ED. +AKI  Clinical Impression   Patient evaluated by Physical Therapy with no further acute PT needs identified. Patient moves quickly, impulsively however with no imbalance while walking with RW (despite carrying it over thresholds). She was able to stand at sink and complete bimanual tasks without support with proper sequencing and reaching outside her base of support. Anticipate she can return to her group home as per pt she is at her baseline. PT is signing off. Thank you for this referral.     Follow Up Recommendations No PT follow up;Supervision for mobility/OOB    Equipment Recommendations  None recommended by PT    Recommendations for Other Services       Precautions / Restrictions Precautions Precautions: Fall Precaution Comments: denies history of falls      Mobility  Bed Mobility Overal bed mobility: Needs Assistance Bed Mobility: Supine to Sit;Sit to Supine     Supine to sit: Supervision Sit to supine: Supervision   General bed mobility comments: supervision for safety due to impulsivity  Transfers Overall transfer level: Needs assistance Equipment used: Rolling walker (2 wheeled) Transfers: Sit to/from Stand Sit to Stand: Supervision  From EOB and on/off low toilet (with grab bar); also performed toilet hygiene in standing with supervision for safety with no imbalance.          General transfer comment: pt stood quickly despite cues to sit EOB "for a minute"; no imbalance and denied dizziness.  Ambulation/Gait Ambulation/Gait assistance:  Min guard Gait Distance (Feet): 40 Feet Assistive device: Rolling walker (2 wheeled) Gait Pattern/deviations: Step-through pattern;Decreased stride length;Wide base of support   Gait velocity interpretation: 1.31 - 2.62 ft/sec, indicative of limited community ambulator General Gait Details: pt lifts and carries RW over threshold; cues for attending to IV in her arm and which way she can turn; no imbalance with multiple turns, denied dizziness  Stairs            Wheelchair Mobility    Modified Rankin (Stroke Patients Only)       Balance Overall balance assessment: No apparent balance deficits (not formally assessed)(reports her doctor told her to use RW)                                           Pertinent Vitals/Pain Pain Assessment: No/denies pain    Home Living Family/patient expects to be discharged to:: Group home                      Prior Function Level of Independence: Needs assistance   Gait / Transfers Assistance Needed: pt reports independent with RW (later sounded like she has a rollator)           Hand Dominance        Extremity/Trunk Assessment   Upper Extremity Assessment Upper Extremity Assessment: Overall WFL for tasks assessed    Lower Extremity Assessment Lower Extremity Assessment: Overall WFL for tasks assessed    Cervical / Trunk Assessment Cervical / Trunk Assessment: Other  exceptions Cervical / Trunk Exceptions: overweight  Communication   Communication: No difficulties  Cognition Arousal/Alertness: Awake/alert Behavior During Therapy: Impulsive Overall Cognitive Status: No family/caregiver present to determine baseline cognitive functioning                                 General Comments: oriented to person, DOB, hospital and situation      General Comments General comments (skin integrity, edema, etc.): pt expressed need to go to the bathroom and continent while walking.      Exercises     Assessment/Plan    PT Assessment Patent does not need any further PT services  PT Problem List         PT Treatment Interventions      PT Goals (Current goals can be found in the Care Plan section)  Acute Rehab PT Goals Patient Stated Goal: go home today PT Goal Formulation: All assessment and education complete, DC therapy    Frequency     Barriers to discharge        Co-evaluation               AM-PAC PT "6 Clicks" Mobility  Outcome Measure Help needed turning from your back to your side while in a flat bed without using bedrails?: None Help needed moving from lying on your back to sitting on the side of a flat bed without using bedrails?: None Help needed moving to and from a bed to a chair (including a wheelchair)?: None Help needed standing up from a chair using your arms (e.g., wheelchair or bedside chair)?: None Help needed to walk in hospital room?: None Help needed climbing 3-5 steps with a railing? : A Little 6 Click Score: 23    End of Session Equipment Utilized During Treatment: Gait belt Activity Tolerance: Patient tolerated treatment well Patient left: in bed;with call bell/phone within reach;with bed alarm set Nurse Communication: Mobility status;Other (comment)(can return to group home per PT; not drinking her meds) PT Visit Diagnosis: Other symptoms and signs involving the nervous system [id number])    Time: [id number] PT Time Calculation (min) (ACUTE ONLY): 16 min   Charges:   PT Evaluation $PT Eval Low Complexity: 1 Low           Arby Barrette, PT Pager [phone]   Rexanne Mano 06/10/2019, 11:44 AM

## 2019-06-15 ENCOUNTER — Inpatient Hospital Stay (HOSPITAL_COMMUNITY)
Admission: EM | Admit: 2019-06-15 | Discharge: 2019-06-22 | DRG: 682 | Disposition: A | Discharge status: Home Health | Payer: Medicare Other | Attending: Internal Medicine | Admitting: Internal Medicine

## 2019-06-15 ENCOUNTER — Emergency Department (HOSPITAL_COMMUNITY): Payer: Medicare Other

## 2019-06-15 ENCOUNTER — Other Ambulatory Visit: Payer: Self-pay

## 2019-06-15 ENCOUNTER — Encounter (HOSPITAL_COMMUNITY): Payer: Self-pay

## 2019-06-15 DIAGNOSIS — N179 Acute kidney failure, unspecified: Principal | ICD-10-CM

## 2019-06-15 DIAGNOSIS — G9341 Metabolic encephalopathy: Secondary | ICD-10-CM | POA: Diagnosis not present

## 2019-06-15 DIAGNOSIS — Z79890 Hormone replacement therapy (postmenopausal): Secondary | ICD-10-CM

## 2019-06-15 DIAGNOSIS — E870 Hyperosmolality and hypernatremia: Secondary | ICD-10-CM | POA: Diagnosis present

## 2019-06-15 DIAGNOSIS — I10 Essential (primary) hypertension: Secondary | ICD-10-CM | POA: Diagnosis not present

## 2019-06-15 DIAGNOSIS — Z79899 Other long term (current) drug therapy: Secondary | ICD-10-CM

## 2019-06-15 DIAGNOSIS — F79 Unspecified intellectual disabilities: Secondary | ICD-10-CM | POA: Diagnosis present

## 2019-06-15 DIAGNOSIS — Z20822 Contact with and (suspected) exposure to covid-19: Secondary | ICD-10-CM | POA: Diagnosis not present

## 2019-06-15 DIAGNOSIS — F4310 Post-traumatic stress disorder, unspecified: Secondary | ICD-10-CM | POA: Diagnosis present

## 2019-06-15 DIAGNOSIS — F259 Schizoaffective disorder, unspecified: Secondary | ICD-10-CM | POA: Diagnosis not present

## 2019-06-15 DIAGNOSIS — R404 Transient alteration of awareness: Secondary | ICD-10-CM | POA: Diagnosis not present

## 2019-06-15 DIAGNOSIS — N183 Chronic kidney disease, stage 3 unspecified: Secondary | ICD-10-CM | POA: Diagnosis present

## 2019-06-15 DIAGNOSIS — R531 Weakness: Secondary | ICD-10-CM

## 2019-06-15 DIAGNOSIS — F250 Schizoaffective disorder, bipolar type: Secondary | ICD-10-CM | POA: Diagnosis present

## 2019-06-15 DIAGNOSIS — R5381 Other malaise: Secondary | ICD-10-CM | POA: Diagnosis present

## 2019-06-15 DIAGNOSIS — G40909 Epilepsy, unspecified, not intractable, without status epilepticus: Secondary | ICD-10-CM | POA: Diagnosis present

## 2019-06-15 DIAGNOSIS — R4182 Altered mental status, unspecified: Secondary | ICD-10-CM | POA: Diagnosis not present

## 2019-06-15 DIAGNOSIS — F329 Major depressive disorder, single episode, unspecified: Secondary | ICD-10-CM | POA: Diagnosis present

## 2019-06-15 DIAGNOSIS — E876 Hypokalemia: Secondary | ICD-10-CM | POA: Diagnosis present

## 2019-06-15 DIAGNOSIS — R339 Retention of urine, unspecified: Secondary | ICD-10-CM | POA: Diagnosis present

## 2019-06-15 DIAGNOSIS — G9340 Encephalopathy, unspecified: Secondary | ICD-10-CM | POA: Diagnosis present

## 2019-06-15 DIAGNOSIS — E039 Hypothyroidism, unspecified: Secondary | ICD-10-CM | POA: Diagnosis present

## 2019-06-15 DIAGNOSIS — K219 Gastro-esophageal reflux disease without esophagitis: Secondary | ICD-10-CM | POA: Diagnosis present

## 2019-06-15 DIAGNOSIS — F29 Unspecified psychosis not due to a substance or known physiological condition: Secondary | ICD-10-CM | POA: Diagnosis present

## 2019-06-15 DIAGNOSIS — E860 Dehydration: Secondary | ICD-10-CM | POA: Diagnosis present

## 2019-06-15 DIAGNOSIS — N1831 Chronic kidney disease, stage 3a: Secondary | ICD-10-CM | POA: Diagnosis present

## 2019-06-15 DIAGNOSIS — R0602 Shortness of breath: Secondary | ICD-10-CM

## 2019-06-15 DIAGNOSIS — S0990XA Unspecified injury of head, initial encounter: Secondary | ICD-10-CM | POA: Diagnosis not present

## 2019-06-15 DIAGNOSIS — R0902 Hypoxemia: Secondary | ICD-10-CM | POA: Diagnosis not present

## 2019-06-15 DIAGNOSIS — I517 Cardiomegaly: Secondary | ICD-10-CM | POA: Diagnosis not present

## 2019-06-15 DIAGNOSIS — R400 Somnolence: Secondary | ICD-10-CM

## 2019-06-15 DIAGNOSIS — R6250 Unspecified lack of expected normal physiological development in childhood: Secondary | ICD-10-CM | POA: Diagnosis present

## 2019-06-15 DIAGNOSIS — S199XXA Unspecified injury of neck, initial encounter: Secondary | ICD-10-CM | POA: Diagnosis not present

## 2019-06-15 LAB — POCT I-STAT EG7
Acid-Base Excess: 5 mmol/L — ABNORMAL HIGH (ref 0.0–2.0)
Bicarbonate: 30 mmol/L — ABNORMAL HIGH (ref 20.0–28.0)
Calcium, Ion: 1.29 mmol/L (ref 1.15–1.40)
HCT: 34 % — ABNORMAL LOW (ref 36.0–46.0)
Hemoglobin: 11.6 g/dL — ABNORMAL LOW (ref 12.0–15.0)
O2 Saturation: 94 %
Potassium: 4.1 mmol/L (ref 3.5–5.1)
Sodium: 145 mmol/L (ref 135–145)
TCO2: 31 mmol/L (ref 22–32)
pCO2, Ven: 46.1 mmHg (ref 44.0–60.0)
pH, Ven: 7.421 (ref 7.250–7.430)
pO2, Ven: 69 mmHg — ABNORMAL HIGH (ref 32.0–45.0)

## 2019-06-15 LAB — CBC WITH DIFFERENTIAL/PLATELET
Abs Immature Granulocytes: 0.02 10*3/uL (ref 0.00–0.07)
Basophils Absolute: 0 10*3/uL (ref 0.0–0.1)
Basophils Relative: 1 %
Eosinophils Absolute: 0.1 10*3/uL (ref 0.0–0.5)
Eosinophils Relative: 1 %
HCT: 36.6 % (ref 36.0–46.0)
Hemoglobin: 11.1 g/dL — ABNORMAL LOW (ref 12.0–15.0)
Immature Granulocytes: 0 %
Lymphocytes Relative: 20 %
Lymphs Abs: 1.3 10*3/uL (ref 0.7–4.0)
MCH: 29.6 pg (ref 26.0–34.0)
MCHC: 30.3 g/dL (ref 30.0–36.0)
MCV: 97.6 fL (ref 80.0–100.0)
Monocytes Absolute: 0.5 10*3/uL (ref 0.1–1.0)
Monocytes Relative: 7 %
Neutro Abs: 4.7 10*3/uL (ref 1.7–7.7)
Neutrophils Relative %: 71 %
Platelets: 147 10*3/uL — ABNORMAL LOW (ref 150–400)
RBC: 3.75 MIL/uL — ABNORMAL LOW (ref 3.87–5.11)
RDW: 12.5 % (ref 11.5–15.5)
WBC: 6.6 10*3/uL (ref 4.0–10.5)
nRBC: 0 % (ref 0.0–0.2)

## 2019-06-15 LAB — COMPREHENSIVE METABOLIC PANEL
ALT: 34 U/L (ref 0–44)
AST: 44 U/L — ABNORMAL HIGH (ref 15–41)
Albumin: 3.4 g/dL — ABNORMAL LOW (ref 3.5–5.0)
Alkaline Phosphatase: 78 U/L (ref 38–126)
Anion gap: 12 (ref 5–15)
BUN: 20 mg/dL (ref 6–20)
CO2: 26 mmol/L (ref 22–32)
Calcium: 10.2 mg/dL (ref 8.9–10.3)
Chloride: 106 mmol/L (ref 98–111)
Creatinine, Ser: 1.6 mg/dL — ABNORMAL HIGH (ref 0.44–1.00)
GFR calc Af Amer: 45 mL/min — ABNORMAL LOW (ref >60)
GFR calc non Af Amer: 39 mL/min — ABNORMAL LOW (ref >60)
Glucose, Bld: 92 mg/dL (ref 70–99)
Potassium: 4.3 mmol/L (ref 3.5–5.1)
Sodium: 144 mmol/L (ref 135–145)
Total Bilirubin: 0.6 mg/dL (ref 0.3–1.2)
Total Protein: 6.6 g/dL (ref 6.5–8.1)

## 2019-06-15 LAB — SARS CORONAVIRUS 2 BY RT PCR (HOSPITAL ORDER, PERFORMED IN ~~LOC~~ HOSPITAL LAB): SARS Coronavirus 2: NEGATIVE

## 2019-06-15 LAB — LITHIUM LEVEL: Lithium Lvl: <0.06 mmol/L — ABNORMAL LOW (ref 0.60–1.20)

## 2019-06-15 LAB — AMMONIA: Ammonia: 25 umol/L (ref 9–35)

## 2019-06-15 MED ORDER — SODIUM CHLORIDE 0.9 % IV BOLUS
500.0000 mL | Freq: Once | INTRAVENOUS | Status: AC
  Administered 2019-06-15: 500 mL via INTRAVENOUS

## 2019-06-15 NOTE — ED Provider Notes (Signed)
[facility] EMERGENCY DEPARTMENT Provider Note   CSN: [id number] Arrival date & time: 06/15/19  2053     History Chief Complaint  Patient presents with  . Weakness    Sonya Moran is a 44 y.o. female.  The history is provided by the patient, medical records and a caregiver. No language interpreter was used.  Weakness  Sonya Moran is a 44 y.o. female who presents to the Emergency Department complaining of altered mental status. Level V caveat due to intellectual disability. History is provided by the group home caretaker, Sonya Moran. Sonya Moran presents the emergency department for progressive generalized weakness, increased lethargy and poor oral intake. [name] reports that Sonya Moran's gait is more stumbling and she is following at home. Sonya Moran complains of sore throat and feeling unwell. She states she has a chronic cough. She was recently admitted to the hospital for lithium toxicity. She had a similar presentation at that time per caretaker but more severe than what is currently going on.    Past Medical History:  Diagnosis Date  . Depressive disorder   . Hypothyroidism 06/16/2019  . Mental retardation   . Post traumatic stress disorder   . Psychotic disorder [facility])     Patient Active Problem List   Diagnosis Date Noted  . Acute encephalopathy 06/16/2019  . Hypothyroidism 06/16/2019  . Schizoaffective disorder, bipolar type ([place]) 06/07/2019  . Lithium toxicity 06/04/2019  . Acute renal failure superimposed on stage 3 chronic kidney disease ([place]) 06/04/2019  . Allergic rhinitis 03/01/2019  . Mood disorder ([place]) 02/26/2018  . Acute sinusitis 12/26/2017  . Mixed rhinitis 08/01/2017  . Ear pain, bilateral 08/01/2017  . Eustachian tube dysfunction, bilateral 08/01/2017  . Plantar fasciitis of right foot 03/28/2015  . Metatarsal deformity 03/28/2015  . Calcaneal spur 03/28/2015  . Anemia 07/23/2013  . Onychomycosis 06/24/2012    Past Surgical History:    Procedure Laterality Date  . BACK SURGERY    . WRIST SURGERY       OB History   No obstetric history on file.     History reviewed. No pertinent family history.  Social History   Tobacco Use  . Smoking status: Never Smoker  . Smokeless tobacco: Never Used  Substance Use Topics  . Alcohol use: No    Alcohol/week: 0.0 standard drinks  . Drug use: No    Home Medications Prior to Admission medications   Medication Sig Start Date End Date Taking? Authorizing Provider  amitriptyline (ELAVIL) 100 MG tablet Take 100 mg by mouth at bedtime.  06/11/18   [provider]  Azelastine HCl 0.15 % SOLN 2 SPRAYS EACH NOSTRIL TWICE DAILY. Patient taking differently: Place 2 sprays into the nose in the morning and at bedtime.  10/09/18   Dara Hoyer, FNP  Calcium Carbonate-Vitamin D (CALCIUM 500/D PO) Take by mouth daily.    [provider]  celecoxib (CELEBREX) 100 MG capsule Take 100 mg by mouth daily.     [provider]  cetirizine (ZYRTEC) 10 MG tablet TAKE (1) TABLET BY MOUTH ONCE DAILY. Patient taking differently: Take 10 mg by mouth daily.  04/12/19   Dara Hoyer, FNP  dexlansoprazole (DEXILANT) 60 MG capsule Take 60 mg by mouth daily.    [provider]  fenofibrate (TRICOR) 145 MG tablet Take 145 mg by mouth at bedtime.     [provider]  fluticasone (FLONASE) 50 MCG/ACT nasal spray 2 SPRAYS INTO BOTH NOSTRILS ONCE A  DAY AS NEEDED FOR STUFFY NOSE Patient taking differently: Place 2 sprays into both nostrils daily as needed for allergies or rhinitis. 2 SPRAYS INTO BOTH NOSTRILS ONCE A DAY AS NEEDED FOR STUFFY NOSE 10/09/18   Ambs, Kathrine Cords, FNP  lamoTRIgine (LAMICTAL) 200 MG tablet Take 200 mg by mouth daily.  06/16/18   [provider]  levothyroxine (SYNTHROID) 25 MCG tablet Take 25 mcg by mouth daily.  06/11/18   [provider]  LINZESS 145 MCG CAPS capsule Take 145 mcg by mouth daily. 02/08/19   [provider]   LORazepam (ATIVAN) 1 MG tablet Take 1 tablet (1 mg total) by mouth every 8 (eight) hours as needed for anxiety. 06/10/19   Mikhail, Velta Addison, DO  mirabegron ER (MYRBETRIQ) 25 MG TB24 tablet Take 25 mg by mouth daily.    [provider]  montelukast (SINGULAIR) 10 MG tablet TAKE ONE TABLET BY MOUTH EACH EVENING TO PREVENT COUGH OR WHEEZE Patient taking differently: Take 10 mg by mouth at bedtime.  02/07/17   Valentina Shaggy, MD  OLANZapine (ZYPREXA) 15 MG tablet Take 15 mg by mouth at bedtime. 02/08/19   [provider]  OLANZapine (ZYPREXA) 15 MG tablet Take 1 tablet (15 mg total) by mouth daily after breakfast. 06/11/19   Cristal Ford, DO  Olopatadine HCl 0.2 % SOLN Place 1 drop into both eyes daily as needed (for itching).    [provider]  phenol (CHLORASEPTIC) 1.4 % LIQD Use as directed 1 spray in the mouth or throat as needed for throat irritation / pain. 06/10/19   Mikhail, Velta Addison, DO  propranolol (INDERAL) 40 MG tablet Take 40 mg by mouth 2 (two) times daily. 02/15/19   [provider]  SALINE MIST 0.65 % nasal spray 2 SPRAYS EACH NOSTRIL TWICE DAILY. Patient taking differently: Place 2 sprays into the nose in the morning and at bedtime.  02/10/17   Valentina Shaggy, MD    Allergies    Grifulvin v [griseofulvin]  Review of Systems   Review of Systems  Neurological: Positive for weakness.  All other systems reviewed and are negative.   Physical Exam Updated Vital Signs BP (!) 86/67 (BP Location: Right Arm)   Pulse 74   Temp 98.3 F (36.8 C) (Oral)   Resp 16   Ht 4\' 10"  (1.473 m)   Wt 70.3 kg   SpO2 95%   BMI 32.39 kg/m   Physical Exam Vitals and nursing note reviewed.  Constitutional:      Appearance: She is well-developed. She is ill-appearing.  HENT:     Head: Normocephalic.     Comments: Healing ecchymosis to the right temple.    Mouth/Throat:     Mouth: Mucous membranes are dry.  Cardiovascular:     Rate and Rhythm:  Normal rate and regular rhythm.     Heart sounds: No murmur.  Pulmonary:     Effort: Pulmonary effort is normal. No respiratory distress.     Breath sounds: Normal breath sounds.  Abdominal:     Palpations: Abdomen is soft.     Tenderness: There is no abdominal tenderness. There is no guarding or rebound.  Musculoskeletal:        General: No tenderness.  Skin:    General: Skin is warm and dry.  Neurological:     Mental Status: She is alert.     Comments: Oriented to person and place. Generalized weakness.  Psychiatric:        Behavior:  Behavior normal.     ED Results / Procedures / Treatments   Labs (all labs ordered are listed, but only abnormal results are displayed) Labs Reviewed  LITHIUM LEVEL - Abnormal; Notable for the following components:      Result Value   Lithium Lvl <0.06 (*)    All other components within normal limits  CBC WITH DIFFERENTIAL/PLATELET - Abnormal; Notable for the following components:   RBC 3.75 (*)    Hemoglobin 11.1 (*)    Platelets 147 (*)    All other components within normal limits  COMPREHENSIVE METABOLIC PANEL - Abnormal; Notable for the following components:   Creatinine, Ser 1.60 (*)    Albumin 3.4 (*)    AST 44 (*)    GFR calc non Af Amer 39 (*)    GFR calc Af Amer 45 (*)    All other components within normal limits  COMPREHENSIVE METABOLIC PANEL - Abnormal; Notable for the following components:   Glucose, Bld 105 (*)    Creatinine, Ser 1.46 (*)    Total Protein 6.2 (*)    Albumin 3.3 (*)    AST 44 (*)    GFR calc non Af Amer 43 (*)    GFR calc Af Amer 50 (*)    All other components within normal limits  CBC - Abnormal; Notable for the following components:   RBC 3.70 (*)    Hemoglobin 11.1 (*)    Platelets 135 (*)    All other components within normal limits  SARS CORONAVIRUS 2 BY RT PCR (HOSPITAL ORDER, [facility])  URINE CULTURE  AMMONIA  BRAIN NATRIURETIC PEPTIDE  FOLATE  RPR  VITAMIN B12    URINALYSIS, ROUTINE W REFLEX MICROSCOPIC  SODIUM, URINE, RANDOM  CREATININE, URINE, RANDOM  TROPONIN I (HIGH SENSITIVITY)    EKG EKG Interpretation  Date/Time:  Tuesday June 15 2019 21:26:39 EDT Ventricular Rate:  70 PR Interval:    QRS Duration: 105 QT Interval:  398 QTC Calculation: 430 R Axis:   38 Text Interpretation: Sinus rhythm Abnormal inferior Q waves Confirmed by Quintella Reichert [phone]) on 06/15/2019 11:22:04 PM   Radiology CT Head Wo Contrast  Result Date: 06/15/2019 CLINICAL DATA:  Altered mental status and recent falls EXAM: CT HEAD WITHOUT CONTRAST CT CERVICAL SPINE WITHOUT CONTRAST TECHNIQUE: Multidetector CT imaging of the head and cervical spine was performed following the standard protocol without intravenous contrast. Multiplanar CT image reconstructions of the cervical spine were also generated. COMPARISON:  06/04/2019 FINDINGS: CT HEAD FINDINGS Brain: No evidence of acute infarction, hemorrhage, hydrocephalus, extra-axial collection or mass lesion/mass effect. Vascular: No hyperdense vessel or unexpected calcification. Skull: Normal. Negative for fracture or focal lesion. Sinuses/Orbits: No acute finding. Other: None. CT CERVICAL SPINE FINDINGS Alignment: Within normal limits. Skull base and vertebrae: 7 cervical segments are well visualized. Vertebral body height is well maintained. Osteophytic changes are noted most prominent at C5-6. Mild facet hypertrophic changes are noted. No acute fracture or acute facet abnormality is noted. Soft tissues and spinal canal: Surrounding soft tissue structures are within normal limits. Upper chest: Visualized lung apices are unremarkable. Other: None IMPRESSION: CT of the head: No acute intracranial abnormality noted. CT of the cervical spine: No acute abnormality is noted. Mild degenerative changes are seen primarily at C5-6. Electronically Signed   By: Inez Catalina M.D.   On: 06/15/2019 22:59   CT Cervical Spine Wo Contrast  Result  Date: 06/15/2019 CLINICAL DATA:  Altered mental status and  recent falls EXAM: CT HEAD WITHOUT CONTRAST CT CERVICAL SPINE WITHOUT CONTRAST TECHNIQUE: Multidetector CT imaging of the head and cervical spine was performed following the standard protocol without intravenous contrast. Multiplanar CT image reconstructions of the cervical spine were also generated. COMPARISON:  06/04/2019 FINDINGS: CT HEAD FINDINGS Brain: No evidence of acute infarction, hemorrhage, hydrocephalus, extra-axial collection or mass lesion/mass effect. Vascular: No hyperdense vessel or unexpected calcification. Skull: Normal. Negative for fracture or focal lesion. Sinuses/Orbits: No acute finding. Other: None. CT CERVICAL SPINE FINDINGS Alignment: Within normal limits. Skull base and vertebrae: 7 cervical segments are well visualized. Vertebral body height is well maintained. Osteophytic changes are noted most prominent at C5-6. Mild facet hypertrophic changes are noted. No acute fracture or acute facet abnormality is noted. Soft tissues and spinal canal: Surrounding soft tissue structures are within normal limits. Upper chest: Visualized lung apices are unremarkable. Other: None IMPRESSION: CT of the head: No acute intracranial abnormality noted. CT of the cervical spine: No acute abnormality is noted. Mild degenerative changes are seen primarily at C5-6. Electronically Signed   By: Inez Catalina M.D.   On: 06/15/2019 22:59   DG Chest Port 1 View  Result Date: 06/15/2019 CLINICAL DATA:  Change in mental status EXAM: PORTABLE CHEST 1 VIEW COMPARISON:  Jun 04, 2019 FINDINGS: The heart size and mediastinal contours are mildly enlarged as on prior exam. Bibasilar subsegmental atelectasis is noted. There is elevation of the left hemidiaphragm. There is mild prominence of the central pulmonary vasculature. No acute osseous abnormality. The visualized skeletal structures are unremarkable. IMPRESSION: Mild cardiomegaly and pulmonary vascular  congestion. Electronically Signed   By: Prudencio Pair M.D.   On: 06/15/2019 22:03    Procedures Procedures (including critical care time)  Medications Ordered in ED Medications  0.9 %  sodium chloride infusion ( Intravenous New Bag/Given 06/16/19 0230)  heparin injection 5,000 Units (5,000 Units Subcutaneous Given 06/16/19 [id number])  sodium chloride flush (NS) 0.9 % injection 3 mL (3 mLs Intravenous Not Given 06/16/19 1007)  acetaminophen (TYLENOL) tablet 650 mg (has no administration in time range)    Or  acetaminophen (TYLENOL) suppository 650 mg (has no administration in time range)  senna-docusate (Senokot-S) tablet 1 tablet (has no administration in time range)  ondansetron (ZOFRAN) tablet 4 mg (has no administration in time range)    Or  ondansetron (ZOFRAN) injection 4 mg (has no administration in time range)  sodium chloride 0.9 % bolus 500 mL (0 mLs Intravenous Stopped 06/16/19 0009)    ED Course  I have reviewed the triage vital signs and the nursing notes.  Pertinent labs & imaging results that were available during my care of the patient were reviewed by me and considered in my medical decision making (see chart for details).    MDM Rules/Calculators/A&P                     Patient presents from group home for evaluation of change in mental status over the last week. She is lethargic but arouses will on examination. She appears dehydrated with generalized weakness. Labs demonstrate elevation and creatinine compared to baseline. Chest x-ray with pulmonary vascular congestion, but she does not appear volume overloaded. No evidence of acute infectious process given change in mental status, generalized weakness hospitalist consulted for admission for further treatment.  Final Clinical Impression(s) / ED Diagnoses Final diagnoses:  Weakness  Somnolence    Rx / DC Orders ED Discharge Orders    None  Quintella Reichert, MD 06/16/19 1110

## 2019-06-15 NOTE — ED Triage Notes (Addendum)
Pt BIB [facility] for eval of increased weakness. Caretaker reports that pt has had poor PO intake, decreased urine output. States stumbly gait, falling at home. Pt did void on arrival here, CYU. Pt recently d/c'd from here last Wednesday for lithium toxicity, caretaker reports presentation similar, but not quite as severe

## 2019-06-15 NOTE — ED Notes (Signed)
Lab to add on troponin & BNP

## 2019-06-15 NOTE — ED Notes (Signed)
Sonya Moran, group home owner, [phone] would like an update when available

## 2019-06-16 ENCOUNTER — Encounter (HOSPITAL_COMMUNITY): Payer: Self-pay | Admitting: Family Medicine

## 2019-06-16 DIAGNOSIS — F250 Schizoaffective disorder, bipolar type: Secondary | ICD-10-CM | POA: Diagnosis not present

## 2019-06-16 DIAGNOSIS — N179 Acute kidney failure, unspecified: Secondary | ICD-10-CM | POA: Diagnosis present

## 2019-06-16 DIAGNOSIS — F79 Unspecified intellectual disabilities: Secondary | ICD-10-CM | POA: Diagnosis present

## 2019-06-16 DIAGNOSIS — N1831 Chronic kidney disease, stage 3a: Secondary | ICD-10-CM

## 2019-06-16 DIAGNOSIS — K219 Gastro-esophageal reflux disease without esophagitis: Secondary | ICD-10-CM | POA: Diagnosis present

## 2019-06-16 DIAGNOSIS — R0602 Shortness of breath: Secondary | ICD-10-CM | POA: Diagnosis not present

## 2019-06-16 DIAGNOSIS — E870 Hyperosmolality and hypernatremia: Secondary | ICD-10-CM | POA: Diagnosis present

## 2019-06-16 DIAGNOSIS — E039 Hypothyroidism, unspecified: Secondary | ICD-10-CM | POA: Diagnosis not present

## 2019-06-16 DIAGNOSIS — G40909 Epilepsy, unspecified, not intractable, without status epilepticus: Secondary | ICD-10-CM | POA: Diagnosis present

## 2019-06-16 DIAGNOSIS — Z79899 Other long term (current) drug therapy: Secondary | ICD-10-CM | POA: Diagnosis not present

## 2019-06-16 DIAGNOSIS — F4310 Post-traumatic stress disorder, unspecified: Secondary | ICD-10-CM | POA: Diagnosis present

## 2019-06-16 DIAGNOSIS — R6250 Unspecified lack of expected normal physiological development in childhood: Secondary | ICD-10-CM | POA: Diagnosis present

## 2019-06-16 DIAGNOSIS — F259 Schizoaffective disorder, unspecified: Secondary | ICD-10-CM | POA: Diagnosis present

## 2019-06-16 DIAGNOSIS — G9341 Metabolic encephalopathy: Secondary | ICD-10-CM | POA: Diagnosis present

## 2019-06-16 DIAGNOSIS — E876 Hypokalemia: Secondary | ICD-10-CM | POA: Diagnosis present

## 2019-06-16 DIAGNOSIS — R5381 Other malaise: Secondary | ICD-10-CM | POA: Diagnosis present

## 2019-06-16 DIAGNOSIS — E860 Dehydration: Secondary | ICD-10-CM | POA: Diagnosis present

## 2019-06-16 DIAGNOSIS — Z20822 Contact with and (suspected) exposure to covid-19: Secondary | ICD-10-CM | POA: Diagnosis present

## 2019-06-16 DIAGNOSIS — G9340 Encephalopathy, unspecified: Secondary | ICD-10-CM | POA: Diagnosis present

## 2019-06-16 DIAGNOSIS — F29 Unspecified psychosis not due to a substance or known physiological condition: Secondary | ICD-10-CM | POA: Diagnosis present

## 2019-06-16 DIAGNOSIS — F329 Major depressive disorder, single episode, unspecified: Secondary | ICD-10-CM | POA: Diagnosis present

## 2019-06-16 DIAGNOSIS — Z79890 Hormone replacement therapy (postmenopausal): Secondary | ICD-10-CM | POA: Diagnosis not present

## 2019-06-16 DIAGNOSIS — R339 Retention of urine, unspecified: Secondary | ICD-10-CM | POA: Diagnosis present

## 2019-06-16 HISTORY — DX: Hypothyroidism, unspecified: E03.9

## 2019-06-16 LAB — TROPONIN I (HIGH SENSITIVITY): Troponin I (High Sensitivity): 4 ng/L (ref <18)

## 2019-06-16 LAB — COMPREHENSIVE METABOLIC PANEL
ALT: 32 U/L (ref 0–44)
AST: 44 U/L — ABNORMAL HIGH (ref 15–41)
Albumin: 3.3 g/dL — ABNORMAL LOW (ref 3.5–5.0)
Alkaline Phosphatase: 74 U/L (ref 38–126)
Anion gap: 9 (ref 5–15)
BUN: 15 mg/dL (ref 6–20)
CO2: 24 mmol/L (ref 22–32)
Calcium: 9.6 mg/dL (ref 8.9–10.3)
Chloride: 110 mmol/L (ref 98–111)
Creatinine, Ser: 1.46 mg/dL — ABNORMAL HIGH (ref 0.44–1.00)
GFR calc Af Amer: 50 mL/min — ABNORMAL LOW (ref >60)
GFR calc non Af Amer: 43 mL/min — ABNORMAL LOW (ref >60)
Glucose, Bld: 105 mg/dL — ABNORMAL HIGH (ref 70–99)
Potassium: 3.7 mmol/L (ref 3.5–5.1)
Sodium: 143 mmol/L (ref 135–145)
Total Bilirubin: 0.4 mg/dL (ref 0.3–1.2)
Total Protein: 6.2 g/dL — ABNORMAL LOW (ref 6.5–8.1)

## 2019-06-16 LAB — CBC
HCT: 36.4 % (ref 36.0–46.0)
Hemoglobin: 11.1 g/dL — ABNORMAL LOW (ref 12.0–15.0)
MCH: 30 pg (ref 26.0–34.0)
MCHC: 30.5 g/dL (ref 30.0–36.0)
MCV: 98.4 fL (ref 80.0–100.0)
Platelets: 135 10*3/uL — ABNORMAL LOW (ref 150–400)
RBC: 3.7 MIL/uL — ABNORMAL LOW (ref 3.87–5.11)
RDW: 12.6 % (ref 11.5–15.5)
WBC: 5.6 10*3/uL (ref 4.0–10.5)
nRBC: 0 % (ref 0.0–0.2)

## 2019-06-16 LAB — BRAIN NATRIURETIC PEPTIDE: B Natriuretic Peptide: 20.4 pg/mL (ref 0.0–100.0)

## 2019-06-16 LAB — FOLATE: Folate: 12.9 ng/mL (ref >5.9)

## 2019-06-16 LAB — RPR: RPR Ser Ql: NONREACTIVE

## 2019-06-16 LAB — VITAMIN B12: Vitamin B-12: 903 pg/mL (ref 180–914)

## 2019-06-16 MED ORDER — MIRABEGRON ER 25 MG PO TB24
25.0000 mg | ORAL_TABLET | Freq: Every day | ORAL | Status: DC
  Administered 2019-06-16 – 2019-06-22 (×7): 25 mg via ORAL
  Filled 2019-06-16 (×7): qty 1

## 2019-06-16 MED ORDER — LORAZEPAM 1 MG PO TABS
1.0000 mg | ORAL_TABLET | Freq: Three times a day (TID) | ORAL | Status: DC | PRN
  Administered 2019-06-17 – 2019-06-20 (×2): 1 mg via ORAL
  Filled 2019-06-16 (×2): qty 1

## 2019-06-16 MED ORDER — OLANZAPINE 5 MG PO TABS
15.0000 mg | ORAL_TABLET | Freq: Every day | ORAL | Status: DC
  Administered 2019-06-16 – 2019-06-22 (×7): 15 mg via ORAL
  Filled 2019-06-16 (×7): qty 3

## 2019-06-16 MED ORDER — ACETAMINOPHEN 325 MG PO TABS
650.0000 mg | ORAL_TABLET | Freq: Four times a day (QID) | ORAL | Status: DC | PRN
  Administered 2019-06-17 – 2019-06-18 (×2): 650 mg via ORAL
  Filled 2019-06-16 (×2): qty 2

## 2019-06-16 MED ORDER — FENOFIBRATE 54 MG PO TABS
54.0000 mg | ORAL_TABLET | Freq: Every day | ORAL | Status: DC
  Administered 2019-06-16 – 2019-06-22 (×7): 54 mg via ORAL
  Filled 2019-06-16 (×7): qty 1

## 2019-06-16 MED ORDER — ONDANSETRON HCL 4 MG PO TABS: 4.0000 mg | ORAL_TABLET | Freq: Four times a day (QID) | ORAL | Status: DC | PRN

## 2019-06-16 MED ORDER — AMITRIPTYLINE HCL 50 MG PO TABS
100.0000 mg | ORAL_TABLET | Freq: Every day | ORAL | Status: DC
  Administered 2019-06-16 – 2019-06-21 (×6): 100 mg via ORAL
  Filled 2019-06-16 (×6): qty 2

## 2019-06-16 MED ORDER — SODIUM CHLORIDE 0.9% FLUSH
3.0000 mL | Freq: Two times a day (BID) | INTRAVENOUS | Status: DC
  Administered 2019-06-16 – 2019-06-21 (×7): 3 mL via INTRAVENOUS

## 2019-06-16 MED ORDER — HEPARIN SODIUM (PORCINE) 5000 UNIT/ML IJ SOLN
5000.0000 [IU] | Freq: Three times a day (TID) | INTRAMUSCULAR | Status: DC
  Administered 2019-06-16 – 2019-06-22 (×20): 5000 [IU] via SUBCUTANEOUS
  Filled 2019-06-16 (×19): qty 1

## 2019-06-16 MED ORDER — LINACLOTIDE 145 MCG PO CAPS
145.0000 ug | ORAL_CAPSULE | Freq: Every day | ORAL | Status: DC
  Administered 2019-06-17 – 2019-06-22 (×6): 145 ug via ORAL
  Filled 2019-06-16 (×8): qty 1

## 2019-06-16 MED ORDER — SODIUM CHLORIDE 0.9 % IV SOLN: INTRAVENOUS | Status: AC

## 2019-06-16 MED ORDER — ONDANSETRON HCL 4 MG/2ML IJ SOLN: 4.0000 mg | Freq: Four times a day (QID) | INTRAMUSCULAR | Status: DC | PRN

## 2019-06-16 MED ORDER — ACETAMINOPHEN 650 MG RE SUPP: 650.0000 mg | Freq: Four times a day (QID) | RECTAL | Status: DC | PRN

## 2019-06-16 MED ORDER — SENNOSIDES-DOCUSATE SODIUM 8.6-50 MG PO TABS: 1.0000 | ORAL_TABLET | Freq: Every evening | ORAL | Status: DC | PRN

## 2019-06-16 MED ORDER — MONTELUKAST SODIUM 10 MG PO TABS
10.0000 mg | ORAL_TABLET | Freq: Every day | ORAL | Status: DC
  Administered 2019-06-16 – 2019-06-21 (×6): 10 mg via ORAL
  Filled 2019-06-16 (×6): qty 1

## 2019-06-16 MED ORDER — LEVOTHYROXINE SODIUM 25 MCG PO TABS
25.0000 ug | ORAL_TABLET | Freq: Every day | ORAL | Status: DC
  Administered 2019-06-16 – 2019-06-22 (×7): 25 ug via ORAL
  Filled 2019-06-16 (×7): qty 1

## 2019-06-16 MED ORDER — OLANZAPINE 5 MG PO TABS
15.0000 mg | ORAL_TABLET | Freq: Every day | ORAL | Status: DC
  Administered 2019-06-16 – 2019-06-21 (×6): 15 mg via ORAL
  Filled 2019-06-16 (×6): qty 3

## 2019-06-16 MED ORDER — LAMOTRIGINE 100 MG PO TABS
200.0000 mg | ORAL_TABLET | Freq: Every day | ORAL | Status: DC
  Administered 2019-06-16 – 2019-06-22 (×7): 200 mg via ORAL
  Filled 2019-06-16 (×8): qty 2

## 2019-06-16 MED ORDER — FLUTICASONE PROPIONATE 50 MCG/ACT NA SUSP
2.0000 | Freq: Every day | NASAL | Status: DC | PRN
  Filled 2019-06-16: qty 16

## 2019-06-16 NOTE — Evaluation (Signed)
Physical Therapy Evaluation Patient Details Name: Sonya Moran MRN: [id number] DOB: 30-Aug-1975 Today's Date: 06/16/2019   History of Present Illness  Pt is a 44 y/o female admitted from her [facility] secondary to lethargy, found to have acute encephalopathy. PMH including but not limited to developmental delay, hypothyroidism, PTSD and schizoaffective disorder.    Clinical Impression  Pt presented supine in bed with HOB elevated, awake and willing to participate in therapy session. Pt with cognitive deficits at baseline with no caregiver/family member present to provide any reliable information regarding PLOF. Per chart review from recent admission, pt from a [place] and ambulates with a RW (despite pt denying need for an AD with ambulation). At the time of evaluation, pt required min A for bed mobility, min guard for transfers and min guard to maintain upright sitting at EOB. Attempted to initiate gait training with pt; however, pt impulsively sitting back down on bed and wanting to lie down. Based on pt's current functional mobility status and support of Group Home staff, would recommend f/u services via [place]. Pt would continue to benefit from skilled physical therapy services at this time while admitted and after d/c to address the below listed limitations in order to improve overall safety and independence with functional mobility.     Follow Up Recommendations Home health PT;Supervision/Assistance - 24 hour    Equipment Recommendations  None recommended by PT    Recommendations for Other Services       Precautions / Restrictions Precautions Precautions: Fall Restrictions Weight Bearing Restrictions: No      Mobility  Bed Mobility Overal bed mobility: Needs Assistance Bed Mobility: Supine to Sit;Sit to Supine     Supine to sit: Min assist Sit to supine: Supervision   General bed mobility comments: multimodal cueing to perform tasks; min A to assist pt to sitting EOB for  trunk elevation and tactile cueing for bilateral LEs off of bed  Transfers Overall transfer level: Needs assistance Equipment used: 1 person hand held assist Transfers: Sit to/from Stand Sit to Stand: Min guard         General transfer comment: pt with mild instability with transitions; she performed sit<>stand x3 from EOB  Ambulation/Gait             General Gait Details: attempted to take side steps at EOB but pt impulsively sitting back down and wanting to lie back down in bed  Stairs            Wheelchair Mobility    Modified Rankin (Stroke Patients Only)       Balance Overall balance assessment: Needs assistance Sitting-balance support: Feet supported Sitting balance-Leahy Scale: Good     Standing balance support: Single extremity supported Standing balance-Leahy Scale: Poor                               Pertinent Vitals/Pain Pain Assessment: No/denies pain    Home Living Family/patient expects to be discharged to:: Group home                      Prior Function Level of Independence: Needs assistance   Gait / Transfers Assistance Needed: pt stating "no" when asked if she uses an AD with ambulation  ADL's / Homemaking Assistance Needed: pt stated that "Sonya Moran" helps to take care of her  Comments: unsure of reliability of information provided as pt with cognitive deficits at baseline  and no caregiver/family present     Hand Dominance        Extremity/Trunk Assessment   Upper Extremity Assessment Upper Extremity Assessment: Generalized weakness    Lower Extremity Assessment Lower Extremity Assessment: Generalized weakness       Communication   Communication: Expressive difficulties  Cognition Arousal/Alertness: Awake/alert Behavior During Therapy: Flat affect Overall Cognitive Status: No family/caregiver present to determine baseline cognitive functioning Area of Impairment: Problem  solving;Awareness;Safety/judgement;Memory;Following commands                     Memory: Decreased short-term memory Following Commands: Follows one step commands with increased time;Follows one step commands inconsistently Safety/Judgement: Decreased awareness of deficits;Decreased awareness of safety Awareness: Intellectual Problem Solving: Slow processing;Decreased initiation;Difficulty sequencing;Requires verbal cues;Requires tactile cues        General Comments      Exercises     Assessment/Plan    PT Assessment Patient needs continued PT services  PT Problem List Decreased strength;Decreased balance;Decreased mobility;Decreased coordination;Decreased cognition;Decreased knowledge of use of DME;Decreased safety awareness;Decreased knowledge of precautions       PT Treatment Interventions DME instruction;Gait training;Stair training;Functional mobility training;Therapeutic activities;Therapeutic exercise;Balance training;Neuromuscular re-education;Patient/family education    PT Goals (Current goals can be found in the Care Plan section)  Acute Rehab PT Goals Patient Stated Goal: to lay back down PT Goal Formulation: With patient Time For Goal Achievement: 06/30/19 Potential to Achieve Goals: Good    Frequency Min 3X/week   Barriers to discharge        Co-evaluation               AM-PAC PT "6 Clicks" Mobility  Outcome Measure Help needed turning from your back to your side while in a flat bed without using bedrails?: None Help needed moving from lying on your back to sitting on the side of a flat bed without using bedrails?: A Little Help needed moving to and from a bed to a chair (including a wheelchair)?: A Little Help needed standing up from a chair using your arms (e.g., wheelchair or bedside chair)?: A Little Help needed to walk in hospital room?: A Little Help needed climbing 3-5 steps with a railing? : A Little 6 Click Score: 19    End of  Session   Activity Tolerance: Patient tolerated treatment well Patient left: in bed;with call bell/phone within reach;with bed alarm set Nurse Communication: Mobility status PT Visit Diagnosis: Other abnormalities of gait and mobility (R26.89)    Time: [id number] PT Time Calculation (min) (ACUTE ONLY): 13 min   Charges:   PT Evaluation $PT Eval Moderate Complexity: 1 Mod          Eduard Clos, PT, DPT  Acute Rehabilitation Services Pager [phone] Office [employer] 06/16/2019, 11:51 AM

## 2019-06-16 NOTE — H&P (Signed)
History and Physical    TARY OAKS [id number] DOB: Nov 10, 1975 DOA: 06/15/2019  PCP: Lucianne Lei, MD   Patient coming from: Group home   Chief Complaint: Lethargy, decreased oral intake, falls  HPI: Sonya Moran is a 44 y.o. female with medical history significant for developmental delay, hypothyroidism, and schizoaffective disorder, now presenting to the emergency department from her group home with lethargy, decreased oral intake, and falls.  Patient was discharged in the hospital less than a week ago after admission with lithium toxicity, has not taken any lithium since prior to the recent admission, and was reportedly alert and oriented to self by time of recent discharge, but has since developed progressive lethargy, and is not eating or drinking much of anything, has not been urinating much, and has had multiple falls.  Patient denies SOB, states that her cough is chronic, denies chest pain or abdominal pain, and denies fevers or dysuria. She complains of fatigue and general weakness.   ED Course: Upon arrival to the ED, patient is found to be afebrile, saturating mid 90s, and with stable blood pressure.  EKG features sinus rhythm and chest x-ray with mild cardiomegaly and pulmonary vascular congestion.  Noncontrast head CT is negative for acute intracranial abnormality and cervical spine CT is negative for acute pathology.  Chemistry panel notable for creatinine 1.60, up from 1.18 a few days earlier.  CBC unremarkable.  Ammonia level is normal, lithium undetectably low, and Covid PCR is negative.  Patient was given 500 cc of saline in the ED.  Urinalysis and urine culture were ordered from the ED.  Review of Systems:  Unable to complete ROS secondary to the patient's clinical condition.  Past Medical History:  Diagnosis Date  . Depressive disorder   . Hypothyroidism 06/16/2019  . Mental retardation   . Post traumatic stress disorder   . Psychotic disorder [facility])     Past Surgical  History:  Procedure Laterality Date  . BACK SURGERY    . WRIST SURGERY       reports that she has never smoked. She has never used smokeless tobacco. She reports that she does not drink alcohol or use drugs.  Allergies  Allergen Reactions  . Grifulvin V [Griseofulvin] Anaphylaxis and Other (See Comments)    Arm swelling    History reviewed. No pertinent family history.   Prior to Admission medications   Medication Sig Start Date End Date Taking? Authorizing Provider  amitriptyline (ELAVIL) 100 MG tablet Take 100 mg by mouth at bedtime.  06/11/18   [provider]  Azelastine HCl 0.15 % SOLN 2 SPRAYS EACH NOSTRIL TWICE DAILY. Patient taking differently: Place 2 sprays into the nose in the morning and at bedtime.  10/09/18   Dara Hoyer, FNP  Calcium Carbonate-Vitamin D (CALCIUM 500/D PO) Take by mouth daily.    [provider]  celecoxib (CELEBREX) 100 MG capsule Take 100 mg by mouth daily.     [provider]  cetirizine (ZYRTEC) 10 MG tablet TAKE (1) TABLET BY MOUTH ONCE DAILY. Patient taking differently: Take 10 mg by mouth daily.  04/12/19   Dara Hoyer, FNP  dexlansoprazole (DEXILANT) 60 MG capsule Take 60 mg by mouth daily.    [provider]  fenofibrate (TRICOR) 145 MG tablet Take 145 mg by mouth at bedtime.     [provider]  fluticasone (FLONASE) 50 MCG/ACT nasal spray 2 SPRAYS INTO BOTH NOSTRILS ONCE A DAY AS NEEDED FOR STUFFY NOSE Patient  taking differently: Place 2 sprays into both nostrils daily as needed for allergies or rhinitis. 2 SPRAYS INTO BOTH NOSTRILS ONCE A DAY AS NEEDED FOR STUFFY NOSE 10/09/18   Ambs, Kathrine Cords, FNP  lamoTRIgine (LAMICTAL) 200 MG tablet Take 200 mg by mouth daily.  06/16/18   [provider]  levothyroxine (SYNTHROID) 25 MCG tablet Take 25 mcg by mouth daily.  06/11/18   [provider]  LINZESS 145 MCG CAPS capsule Take 145 mcg by mouth daily. 02/08/19   [provider]    LORazepam (ATIVAN) 1 MG tablet Take 1 tablet (1 mg total) by mouth every 8 (eight) hours as needed for anxiety. 06/10/19   Mikhail, Velta Addison, DO  mirabegron ER (MYRBETRIQ) 25 MG TB24 tablet Take 25 mg by mouth daily.    [provider]  montelukast (SINGULAIR) 10 MG tablet TAKE ONE TABLET BY MOUTH EACH EVENING TO PREVENT COUGH OR WHEEZE Patient taking differently: Take 10 mg by mouth at bedtime.  02/07/17   Valentina Shaggy, MD  OLANZapine (ZYPREXA) 15 MG tablet Take 15 mg by mouth at bedtime. 02/08/19   [provider]  OLANZapine (ZYPREXA) 15 MG tablet Take 1 tablet (15 mg total) by mouth daily after breakfast. 06/11/19   Cristal Ford, DO  Olopatadine HCl 0.2 % SOLN Place 1 drop into both eyes daily as needed (for itching).    [provider]  phenol (CHLORASEPTIC) 1.4 % LIQD Use as directed 1 spray in the mouth or throat as needed for throat irritation / pain. 06/10/19   Mikhail, Velta Addison, DO  propranolol (INDERAL) 40 MG tablet Take 40 mg by mouth 2 (two) times daily. 02/15/19   [provider]  SALINE MIST 0.65 % nasal spray 2 SPRAYS EACH NOSTRIL TWICE DAILY. Patient taking differently: Place 2 sprays into the nose in the morning and at bedtime.  02/10/17   Valentina Shaggy, MD    Physical Exam: Vitals:   06/15/19 2124 06/15/19 2130 06/15/19 2318 06/15/19 2337  BP:  106/67 126/87   Pulse:  72 70   Resp:  16 19   Temp:    98.4 F (36.9 C)  TempSrc:    Oral  SpO2:  98% 96%   Weight: 72 kg     Height: 4\' 10"  (1.473 m)        Constitutional: NAD, calm  Eyes: PERTLA, lids and conjunctivae normal ENMT: Mucous membranes are moist. Posterior pharynx clear of any exudate or lesions.   Neck: normal, supple, no masses, no thyromegaly Respiratory: no wheezing, no crackles. No accessory muscle use.  Cardiovascular: S1 & S2 heard, regular rate and rhythm. No extremity edema.   Abdomen: No distension, no tenderness, soft. Bowel sounds active.   Musculoskeletal: no clubbing / cyanosis. No joint deformity upper and lower extremities.   Skin: no significant rashes, lesions, ulcers. Warm, dry, well-perfused. Neurologic: No gross facial asymmetry. Sensation intact. Moving all extremities.  Psychiatric: Sleeping, wakes to voice. Oriented to person and place only. Cooperative.    Labs and Imaging on Admission: I have personally reviewed following labs and imaging studies  CBC: Recent Labs  Lab 06/09/19 0407 06/15/19 2121 06/15/19 2159  WBC 5.7 6.6  --   NEUTROABS 4.0 4.7  --   HGB 10.6* 11.1* 11.6*  HCT 33.2* 36.6 34.0*  MCV 96.0 97.6  --   PLT 163 147*  --    Basic Metabolic Panel: Recent Labs  Lab 06/09/19 0407 06/10/19 0344 06/15/19 2121 06/15/19 2159  NA 144 145 144 145  K 3.5 3.7 4.3 4.1  CL 115* 115* 106  --   CO2 22 22 26   --   GLUCOSE 130* 135* 92  --   BUN 6 6 20   --   CREATININE 1.22* 1.18* 1.60*  --   CALCIUM 9.7 9.3 10.2  --    GFR: Estimated Creatinine Clearance: 37.4 mL/min (A) (by C-G formula based on SCr of 1.6 mg/dL (H)). Liver Function Tests: Recent Labs  Lab 06/15/19 2121  AST 44*  ALT 34  ALKPHOS 78  BILITOT 0.6  PROT 6.6  ALBUMIN 3.4*   No results for input(s): LIPASE, AMYLASE in the last 168 hours. Recent Labs  Lab 06/15/19 2120  AMMONIA 25   Coagulation Profile: No results for input(s): INR, PROTIME in the last 168 hours. Cardiac Enzymes: No results for input(s): CKTOTAL, CKMB, CKMBINDEX, TROPONINI in the last 168 hours. BNP (last 3 results) No results for input(s): PROBNP in the last 8760 hours. HbA1C: No results for input(s): HGBA1C in the last 72 hours. CBG: No results for input(s): GLUCAP in the last 168 hours. Lipid Profile: No results for input(s): CHOL, HDL, LDLCALC, TRIG, CHOLHDL, LDLDIRECT in the last 72 hours. Thyroid Function Tests: No results for input(s): TSH, T4TOTAL, FREET4, T3FREE, THYROIDAB in the last 72 hours. Anemia Panel: No results for input(s):  VITAMINB12, FOLATE, FERRITIN, TIBC, IRON, RETICCTPCT in the last 72 hours. Urine analysis:    Component Value Date/Time   COLORURINE YELLOW 06/04/2019 1913   APPEARANCEUR CLEAR 06/04/2019 1913   LABSPEC 1.010 06/04/2019 1913   PHURINE 6.0 06/04/2019 1913   GLUCOSEU NEGATIVE 06/04/2019 1913   HGBUR NEGATIVE 06/04/2019 [place] 06/04/2019 [place] 06/04/2019 1913   PROTEINUR NEGATIVE 06/04/2019 1913   UROBILINOGEN 0.2 09/26/2010 1716   NITRITE NEGATIVE 06/04/2019 1913   LEUKOCYTESUR LARGE (A) 06/04/2019 1913   Sepsis Labs: @LABRCNTIP (procalcitonin:4,lacticidven:4) ) Recent Results (from the past 240 hour(s))  SARS Coronavirus 2 by RT PCR (hospital order, performed in [place] hospital lab) Nasopharyngeal Nasopharyngeal Swab     Status: None   Collection Time: 06/15/19  9:41 PM   Specimen: Nasopharyngeal Swab  Result Value Ref Range Status   SARS Coronavirus 2 NEGATIVE NEGATIVE Final    Comment: (NOTE) SARS-CoV-2 target nucleic acids are NOT DETECTED. The SARS-CoV-2 RNA is generally detectable in upper and lower respiratory specimens during the acute phase of infection. The lowest concentration of SARS-CoV-2 viral copies this assay can detect is 250 copies / mL. A negative result does not preclude SARS-CoV-2 infection and should not be used as the sole basis for treatment or other patient management decisions.  A negative result may occur with improper specimen collection / handling, submission of specimen other than nasopharyngeal swab, presence of viral mutation(s) within the areas targeted by this assay, and inadequate number of viral copies (<250 copies / mL). A negative result must be combined with clinical observations, patient history, and epidemiological information. Fact Sheet for Patients:   StrictlyIdeas.no Fact Sheet for Healthcare Providers: BankingDealers.co.za This test is not  yet approved or cleared  by the [place] FDA and has been authorized for detection and/or diagnosis of SARS-CoV-2 by FDA under an Emergency Use Authorization (EUA).  This EUA will remain in effect (meaning this test can be used) for the duration of the COVID-19 declaration under Section 564(b)(1) of the Act, 21 U.S.C. section 360bbb-3(b)(1), unless the authorization is terminated or revoked sooner. Performed at  [facility], [place] [address], [place], [place] [address]      Radiological Exams on Admission: CT Head Wo Contrast  Result Date: 06/15/2019 CLINICAL DATA:  Altered mental status and recent falls EXAM: CT HEAD WITHOUT CONTRAST CT CERVICAL SPINE WITHOUT CONTRAST TECHNIQUE: Multidetector CT imaging of the head and cervical spine was performed following the standard protocol without intravenous contrast. Multiplanar CT image reconstructions of the cervical spine were also generated. COMPARISON:  06/04/2019 FINDINGS: CT HEAD FINDINGS Brain: No evidence of acute infarction, hemorrhage, hydrocephalus, extra-axial collection or mass lesion/mass effect. Vascular: No hyperdense vessel or unexpected calcification. Skull: Normal. Negative for fracture or focal lesion. Sinuses/Orbits: No acute finding. Other: None. CT CERVICAL SPINE FINDINGS Alignment: Within normal limits. Skull base and vertebrae: 7 cervical segments are well visualized. Vertebral body height is well maintained. Osteophytic changes are noted most prominent at C5-6. Mild facet hypertrophic changes are noted. No acute fracture or acute facet abnormality is noted. Soft tissues and spinal canal: Surrounding soft tissue structures are within normal limits. Upper chest: Visualized lung apices are unremarkable. Other: None IMPRESSION: CT of the head: No acute intracranial abnormality noted. CT of the cervical spine: No acute abnormality is noted. Mild degenerative changes are seen primarily at C5-6. Electronically Signed   By: Inez Catalina M.D.   On: 06/15/2019 22:59   CT Cervical Spine Wo Contrast  Result Date: 06/15/2019 CLINICAL DATA:  Altered mental status and recent falls EXAM: CT HEAD WITHOUT CONTRAST CT CERVICAL SPINE WITHOUT CONTRAST TECHNIQUE: Multidetector CT imaging of the head and cervical spine was performed following the standard protocol without intravenous contrast. Multiplanar CT image reconstructions of the cervical spine were also generated. COMPARISON:  06/04/2019 FINDINGS: CT HEAD FINDINGS Brain: No evidence of acute infarction, hemorrhage, hydrocephalus, extra-axial collection or mass lesion/mass effect. Vascular: No hyperdense vessel or unexpected calcification. Skull: Normal. Negative for fracture or focal lesion. Sinuses/Orbits: No acute finding. Other: None. CT CERVICAL SPINE FINDINGS Alignment: Within normal limits. Skull base and vertebrae: 7 cervical segments are well visualized. Vertebral body height is well maintained. Osteophytic changes are noted most prominent at C5-6. Mild facet hypertrophic changes are noted. No acute fracture or acute facet abnormality is noted. Soft tissues and spinal canal: Surrounding soft tissue structures are within normal limits. Upper chest: Visualized lung apices are unremarkable. Other: None IMPRESSION: CT of the head: No acute intracranial abnormality noted. CT of the cervical spine: No acute abnormality is noted. Mild degenerative changes are seen primarily at C5-6. Electronically Signed   By: Inez Catalina M.D.   On: 06/15/2019 22:59   DG Chest Port 1 View  Result Date: 06/15/2019 CLINICAL DATA:  Change in mental status EXAM: PORTABLE CHEST 1 VIEW COMPARISON:  Jun 04, 2019 FINDINGS: The heart size and mediastinal contours are mildly enlarged as on prior exam. Bibasilar subsegmental atelectasis is noted. There is elevation of the left hemidiaphragm. There is mild prominence of the central pulmonary vasculature. No acute osseous abnormality. The visualized skeletal structures  are unremarkable. IMPRESSION: Mild cardiomegaly and pulmonary vascular congestion. Electronically Signed   By: Prudencio Pair M.D.   On: 06/15/2019 22:03    EKG: Independently reviewed. Sinus rhythm.   Assessment/Plan   1. Acute encephalopathy  - Presents with lethargy and not eating or drinking after recent admission with lithium toxicity  - Lithium level low, no acute findings on CT head, and ammonia level normal in ED  - She had normal TSH recently  - There has not  been any seizure-like activity noted  - Possibly related to dehydration  - Continue gentle IVF hydration, check b12, folate, and RPR, continue supportive care    2. Acute kidney injury superimposed on CKD IIIa  - SCr is 1.60 on admission, up from 1.18 several days earlier  - Likely acute prerenal azotemia in setting of poor oral intake  - She was given 500 cc NS in ED  - Renally-dose medications, avoid nephrotoxins, continue IVF hydration, repeat chem panel in am    3. Schizoaffective disorder  - Lethargic on admission  - Pharmacy medication-reconciliation pending    4. Hypothyroidism  - TSH was normal 2 weeks ago  - Plan to continue Synthroid pending pharmacy medication-reconciliation     DVT prophylaxis: Lovenox  Code Status: Full  Family Communication: Discussed with patient  Disposition Plan:  Patient is from: Group home  Anticipated d/c is to: TBD Anticipated d/c date is: 06/17/19 Patient currently: Pending additional workup for lethargy  Consults called: None  Admission status: Observation     Vianne Bulls, MD [facility] Pager: See [email or web address]  If 7AM-7PM, please contact the daytime attending [email or web address]  06/16/2019, 1:19 AM

## 2019-06-16 NOTE — Progress Notes (Signed)
PROGRESS NOTE        PATIENT DETAILS Name: Sonya Moran Age: 44 y.o. Sex: female Date of Birth: Mar 01, 1975 Admit Date: 06/15/2019 Admitting Physician Vianne Bulls, MD [id number], Myra Rude, MD  Brief Narrative: Patient is a 44 y.o. female with history of developmental delay, schizoaffective disorder, hypothyroidism who was just discharged from this hospital on 5/27 after being treated for AKI, lithium toxicity-was referred to the ED from her group home on 6/1 for evaluation of-she was found to have AKI-subsequently admitted to the hospitalist service.  Significant events: 6/1>> referred to the ED from group home-for decreased oral intake/lethargy-found to have AKI.  Significant studies: 6/1: CT head/C-spine>> no acute abnormality noted. 6/1: Chest x-ray>> mild cardiomegaly and pulmonary vascular congestion  Antimicrobial therapy: None  Microbiology data: None  Procedures : None  Consults: None  DVT Prophylaxis : Prophylactic Heparin   Subjective: Awake-seems to answer appropriately at times to simple questions.  Not sure what her usual baseline is at.  Assessment/Plan: Acute metabolic encephalopathy: Secondary to AKI, dehydration.  Relatively awake and alert this morning.  Not sure what her baseline is.  CT imaging head negative for acute abnormalities.  Vitamin B12 within normal limits, RPR nonreactive.  Continue supportive care.  AKI on CKD stage IIIa: Improved but not yet at baseline-etiology of AKI likely hemodynamically mediated.  Volume status stable-stop IV fluids-follow creatinine.  Continue supportive care-encourage oral intake/hydration.  Hypothyroidism: Continue Synthroid  Intellectual disability/Schizoaffective disorder: Appears stable-resume olanzapine, nortriptyline and Lamictal.  No longer on lithium given recent lithium toxicity.  GERD: Continue PPI  Debility/deconditioning: Not sure what her usual baseline is-obtain PT/OT before  consideration of discharge.  Diet: Diet Order            Diet regular Room service appropriate? Yes; Fluid consistency: Thin  Diet effective now               Code Status: Full code   Family Communication: Legal guardian over the phone   Disposition Plan: Status is: Observation  The patient remains OBS appropriate and will d/c before 2 midnights.  Dispo: The patient is from: [place]              Anticipated d/c is to: Group Home              Anticipated d/c date is: 1 day-hopefully on 6/3 if clinical improvement continues.              Patient currently is not medically stable to d/c.   Barriers to Discharge: Resolving AKI-awaiting PT eval-encouraging oral intake-needs recheck of renal function in a.m. before consideration of discharge.  Antimicrobial agents: Anti-infectives (From admission, onward)   None       Time spent: 25- minutes-Greater than 50% of this time was spent in counseling, explanation of diagnosis, planning of further management, and coordination of care.  MEDICATIONS: Scheduled Meds: . heparin  5,000 Units Subcutaneous Q8H  . sodium chloride flush  3 mL Intravenous Q12H   Continuous Infusions: . sodium chloride 90 mL/hr at 06/16/19 0230   PRN Meds:.acetaminophen **OR** acetaminophen, ondansetron **OR** ondansetron (ZOFRAN) IV, senna-docusate   PHYSICAL EXAM: Vital signs: Vitals:   06/16/19 0100 06/16/19 0127 06/16/19 0200 06/16/19 0621  BP: 124/68  (!) 141/76 (!) 86/67  Pulse: 75  73 74  Resp: 18  16 16   Temp:  98.6 F (37 C) 98.7 F (37.1 C) 98.3 F (36.8 C)  TempSrc:  Oral  Oral  SpO2: 97%  96% 95%  Weight:   70.3 kg   Height:       Filed Weights   06/15/19 2124 06/16/19 0200  Weight: 72 kg 70.3 kg   Body mass index is 32.39 kg/m.   Gen Exam:Alert awake-not in any distress HEENT:atraumatic, normocephalic Chest: B/L clear to auscultation anteriorly CVS:S1S2 regular Abdomen:soft non tender, non  distended Extremities:no edema Neurology: Moves all 4 extremities. Skin: no rash  I have personally reviewed following labs and imaging studies  LABORATORY DATA: CBC: Recent Labs  Lab 06/15/19 2121 06/15/19 2159 06/16/19 0635  WBC 6.6  --  5.6  NEUTROABS 4.7  --   --   HGB 11.1* 11.6* 11.1*  HCT 36.6 34.0* 36.4  MCV 97.6  --  98.4  PLT 147*  --  135*    Basic Metabolic Panel: Recent Labs  Lab 06/10/19 0344 06/15/19 2121 06/15/19 2159 06/16/19 0635  NA 145 144 145 143  K 3.7 4.3 4.1 3.7  CL 115* 106  --  110  CO2 22 26  --  24  GLUCOSE 135* 92  --  105*  BUN 6 20  --  15  CREATININE 1.18* 1.60*  --  1.46*  CALCIUM 9.3 10.2  --  9.6    GFR: Estimated Creatinine Clearance: 40.5 mL/min (A) (by C-G formula based on SCr of 1.46 mg/dL (H)).  Liver Function Tests: Recent Labs  Lab 06/15/19 2121 06/16/19 0635  AST 44* 44*  ALT 34 32  ALKPHOS 78 74  BILITOT 0.6 0.4  PROT 6.6 6.2*  ALBUMIN 3.4* 3.3*   No results for input(s): LIPASE, AMYLASE in the last 168 hours. Recent Labs  Lab 06/15/19 2120  AMMONIA 25    Coagulation Profile: No results for input(s): INR, PROTIME in the last 168 hours.  Cardiac Enzymes: No results for input(s): CKTOTAL, CKMB, CKMBINDEX, TROPONINI in the last 168 hours.  BNP (last 3 results) No results for input(s): PROBNP in the last 8760 hours.  Lipid Profile: No results for input(s): CHOL, HDL, LDLCALC, TRIG, CHOLHDL, LDLDIRECT in the last 72 hours.  Thyroid Function Tests: No results for input(s): TSH, T4TOTAL, FREET4, T3FREE, THYROIDAB in the last 72 hours.  Anemia Panel: Recent Labs    06/16/19 0635  VITAMINB12 903  FOLATE 12.9    Urine analysis:    Component Value Date/Time   COLORURINE YELLOW 06/04/2019 1913   APPEARANCEUR CLEAR 06/04/2019 1913   LABSPEC 1.010 06/04/2019 1913   PHURINE 6.0 06/04/2019 1913   GLUCOSEU NEGATIVE 06/04/2019 1913   HGBUR NEGATIVE 06/04/2019 [place] 06/04/2019  [place] 06/04/2019 1913   PROTEINUR NEGATIVE 06/04/2019 1913   UROBILINOGEN 0.2 09/26/2010 1716   NITRITE NEGATIVE 06/04/2019 1913   LEUKOCYTESUR LARGE (A) 06/04/2019 1913    Sepsis Labs: Lactic Acid, Venous No results found for: LATICACIDVEN  MICROBIOLOGY: Recent Results (from the past 240 hour(s))  SARS Coronavirus 2 by RT PCR (hospital order, performed in [place] hospital lab) Nasopharyngeal Nasopharyngeal Swab     Status: None   Collection Time: 06/15/19  9:41 PM   Specimen: Nasopharyngeal Swab  Result Value Ref Range Status   SARS Coronavirus 2 NEGATIVE NEGATIVE Final    Comment: (NOTE) SARS-CoV-2 target nucleic acids are NOT DETECTED. The SARS-CoV-2 RNA is generally detectable in upper and lower respiratory specimens during the acute phase  of infection. The lowest concentration of SARS-CoV-2 viral copies this assay can detect is 250 copies / mL. A negative result does not preclude SARS-CoV-2 infection and should not be used as the sole basis for treatment or other patient management decisions.  A negative result may occur with improper specimen collection / handling, submission of specimen other than nasopharyngeal swab, presence of viral mutation(s) within the areas targeted by this assay, and inadequate number of viral copies (<250 copies / mL). A negative result must be combined with clinical observations, patient history, and epidemiological information. Fact Sheet for Patients:   StrictlyIdeas.no Fact Sheet for Healthcare Providers: BankingDealers.co.za This test is not yet approved or cleared  by the [place] FDA and has been authorized for detection and/or diagnosis of SARS-CoV-2 by FDA under an Emergency Use Authorization (EUA).  This EUA will remain in effect (meaning this test can be used) for the duration of the COVID-19 declaration under Section 564(b)(1) of the Act, 21 U.S.C. section  360bbb-3(b)(1), unless the authorization is terminated or revoked sooner. Performed at [facility], [place] [address] [place], [place] [address]     RADIOLOGY STUDIES/RESULTS: CT Head Wo Contrast  Result Date: 06/15/2019 CLINICAL DATA:  Altered mental status and recent falls EXAM: CT HEAD WITHOUT CONTRAST CT CERVICAL SPINE WITHOUT CONTRAST TECHNIQUE: Multidetector CT imaging of the head and cervical spine was performed following the standard protocol without intravenous contrast. Multiplanar CT image reconstructions of the cervical spine were also generated. COMPARISON:  06/04/2019 FINDINGS: CT HEAD FINDINGS Brain: No evidence of acute infarction, hemorrhage, hydrocephalus, extra-axial collection or mass lesion/mass effect. Vascular: No hyperdense vessel or unexpected calcification. Skull: Normal. Negative for fracture or focal lesion. Sinuses/Orbits: No acute finding. Other: None. CT CERVICAL SPINE FINDINGS Alignment: Within normal limits. Skull base and vertebrae: 7 cervical segments are well visualized. Vertebral body height is well maintained. Osteophytic changes are noted most prominent at C5-6. Mild facet hypertrophic changes are noted. No acute fracture or acute facet abnormality is noted. Soft tissues and spinal canal: Surrounding soft tissue structures are within normal limits. Upper chest: Visualized lung apices are unremarkable. Other: None IMPRESSION: CT of the head: No acute intracranial abnormality noted. CT of the cervical spine: No acute abnormality is noted. Mild degenerative changes are seen primarily at C5-6. Electronically Signed   By: Inez Catalina M.D.   On: 06/15/2019 22:59   CT Cervical Spine Wo Contrast  Result Date: 06/15/2019 CLINICAL DATA:  Altered mental status and recent falls EXAM: CT HEAD WITHOUT CONTRAST CT CERVICAL SPINE WITHOUT CONTRAST TECHNIQUE: Multidetector CT imaging of the head and cervical spine was performed following the standard protocol without  intravenous contrast. Multiplanar CT image reconstructions of the cervical spine were also generated. COMPARISON:  06/04/2019 FINDINGS: CT HEAD FINDINGS Brain: No evidence of acute infarction, hemorrhage, hydrocephalus, extra-axial collection or mass lesion/mass effect. Vascular: No hyperdense vessel or unexpected calcification. Skull: Normal. Negative for fracture or focal lesion. Sinuses/Orbits: No acute finding. Other: None. CT CERVICAL SPINE FINDINGS Alignment: Within normal limits. Skull base and vertebrae: 7 cervical segments are well visualized. Vertebral body height is well maintained. Osteophytic changes are noted most prominent at C5-6. Mild facet hypertrophic changes are noted. No acute fracture or acute facet abnormality is noted. Soft tissues and spinal canal: Surrounding soft tissue structures are within normal limits. Upper chest: Visualized lung apices are unremarkable. Other: None IMPRESSION: CT of the head: No acute intracranial abnormality noted. CT of the cervical spine: No acute abnormality is  noted. Mild degenerative changes are seen primarily at C5-6. Electronically Signed   By: Inez Catalina M.D.   On: 06/15/2019 22:59   DG Chest Port 1 View  Result Date: 06/15/2019 CLINICAL DATA:  Change in mental status EXAM: PORTABLE CHEST 1 VIEW COMPARISON:  Jun 04, 2019 FINDINGS: The heart size and mediastinal contours are mildly enlarged as on prior exam. Bibasilar subsegmental atelectasis is noted. There is elevation of the left hemidiaphragm. There is mild prominence of the central pulmonary vasculature. No acute osseous abnormality. The visualized skeletal structures are unremarkable. IMPRESSION: Mild cardiomegaly and pulmonary vascular congestion. Electronically Signed   By: Prudencio Pair M.D.   On: 06/15/2019 22:03     LOS: 0 days   Oren Binet, MD  [facility]    To contact the attending provider between 7A-7P or the covering provider during after hours 7P-7A, please log into  the web site [email or web address] and access using universal [place] password for that web site. If you do not have the password, please call the hospital operator.  06/16/2019, 11:07 AM

## 2019-06-16 NOTE — TOC Initial Note (Signed)
Transition of Care [facility]) - Initial/Assessment Note    Patient Details  Name: Sonya Moran MRN: [id number] Date of Birth: October 26, 1975  [facility] CM/SW Contact:    Bartholomew Crews, RN Phone Number: [phone] 06/16/2019, 4:42 PM  Clinical Narrative:                  Spoke with legal guardian, Gershon Mussel, on the phone about transition plans. He advised that he handles the legal, and Ethel handles the day to day. Spoke with [name] about transition plans. Discussed recommendations for home health - agreed. Reviewed medicare.gov list for choice of agency in [facility] - address confirmed. Referral accepted by [facility] for PT. Patient will need HH orders for PT with Face to Face at discharge. [name] will provide transportation home. TOC following for transition needs.   Expected Discharge Plan: [place] Barriers to Discharge: Continued Medical Work up   Patient Goals and CMS Choice Patient states their goals for this hospitalization and ongoing recovery are:: return to group home CMS Medicare.gov Compare Post Acute Care list provided to:: Patient Represenative (must comment) Choice offered to / list presented to : [facility] POA / Guardian  Expected Discharge Plan and Services Expected Discharge Plan: [place] In-house Referral: Clinical Social Work Discharge Planning Services: CM Consult Post Acute Care Choice: [place] arrangements for the past 2 months: Group Home                 DME Arranged: N/A DME Agency: NA       HH Arranged: PT [place] Agency: [facility] Date HH Agency Contacted: 06/16/19 Time [place]: [id number] Representative spoke with at [place] Arrangements/Services Living arrangements for the past 2 months: [place] with:: Facility Resident          Need for Family Participation in Patient Care: Yes (Comment) Care giver support system in place?: Yes (comment)       Activities of Daily Living      Permission Sought/Granted                  Emotional Assessment         Alcohol / Substance Use: Not Applicable Psych Involvement: No (comment)  Admission diagnosis:  Acute encephalopathy [G93.40] AKI (acute kidney injury) ([place]) [N17.9] Patient Active Problem List   Diagnosis Date Noted  . Acute encephalopathy 06/16/2019  . Hypothyroidism 06/16/2019  . AKI (acute kidney injury) ([place]) 06/16/2019  . Schizoaffective disorder, bipolar type ([place]) 06/07/2019  . Lithium toxicity 06/04/2019  . Acute renal failure superimposed on stage 3 chronic kidney disease ([place]) 06/04/2019  . Allergic rhinitis 03/01/2019  . Mood disorder ([place]) 02/26/2018  . Acute sinusitis 12/26/2017  . Mixed rhinitis 08/01/2017  . Ear pain, bilateral 08/01/2017  . Eustachian tube dysfunction, bilateral 08/01/2017  . Plantar fasciitis of right foot 03/28/2015  . Metatarsal deformity 03/28/2015  . Calcaneal spur 03/28/2015  . Anemia 07/23/2013  . Onychomycosis 06/24/2012   PCP:  Lucianne Lei, MD Pharmacy:   [facility], [place] [address] Phone: [phone] Fax: [phone]  [facility], [place] [address] [place] [address] Phone: [phone] Fax: [phone]     Social Determinants of Health (SDOH) Interventions    Readmission Risk Interventions  No flowsheet data found.

## 2019-06-17 ENCOUNTER — Inpatient Hospital Stay (HOSPITAL_COMMUNITY): Payer: Medicare Other

## 2019-06-17 LAB — COMPREHENSIVE METABOLIC PANEL
ALT: 39 U/L (ref 0–44)
AST: 54 U/L — ABNORMAL HIGH (ref 15–41)
Albumin: 2.9 g/dL — ABNORMAL LOW (ref 3.5–5.0)
Alkaline Phosphatase: 65 U/L (ref 38–126)
Anion gap: 7 (ref 5–15)
BUN: 13 mg/dL (ref 6–20)
CO2: 25 mmol/L (ref 22–32)
Calcium: 9.6 mg/dL (ref 8.9–10.3)
Chloride: 112 mmol/L — ABNORMAL HIGH (ref 98–111)
Creatinine, Ser: 1.5 mg/dL — ABNORMAL HIGH (ref 0.44–1.00)
GFR calc Af Amer: 48 mL/min — ABNORMAL LOW (ref >60)
GFR calc non Af Amer: 42 mL/min — ABNORMAL LOW (ref >60)
Glucose, Bld: 102 mg/dL — ABNORMAL HIGH (ref 70–99)
Potassium: 3.6 mmol/L (ref 3.5–5.1)
Sodium: 144 mmol/L (ref 135–145)
Total Bilirubin: 0.7 mg/dL (ref 0.3–1.2)
Total Protein: 6 g/dL — ABNORMAL LOW (ref 6.5–8.1)

## 2019-06-17 MED ORDER — TAMSULOSIN HCL 0.4 MG PO CAPS
0.4000 mg | ORAL_CAPSULE | Freq: Every day | ORAL | Status: DC
  Administered 2019-06-17 – 2019-06-22 (×6): 0.4 mg via ORAL
  Filled 2019-06-17 (×6): qty 1

## 2019-06-17 MED ORDER — PHENOL 1.4 % MT LIQD
1.0000 | OROMUCOSAL | Status: DC | PRN
  Administered 2019-06-17 (×2): 1 via OROMUCOSAL
  Filled 2019-06-17: qty 177

## 2019-06-17 MED ORDER — CHLORHEXIDINE GLUCONATE CLOTH 2 % EX PADS
6.0000 | MEDICATED_PAD | Freq: Every day | CUTANEOUS | Status: DC
  Administered 2019-06-17 – 2019-06-21 (×4): 6 via TOPICAL

## 2019-06-17 MED ORDER — SODIUM CHLORIDE 0.9 % IV SOLN: INTRAVENOUS | Status: AC

## 2019-06-17 MED ORDER — GUAIFENESIN-DM 100-10 MG/5ML PO SYRP
10.0000 mL | ORAL_SOLUTION | ORAL | Status: DC | PRN
  Administered 2019-06-17 – 2019-06-18 (×2): 10 mL via ORAL
  Filled 2019-06-17: qty 10

## 2019-06-17 NOTE — Plan of Care (Signed)
  Problem: Health Behavior/Discharge Planning: Goal: Ability to manage health-related needs will improve Outcome: Progressing   Problem: Safety: Goal: Ability to remain free from injury will improve Outcome: Progressing   

## 2019-06-17 NOTE — Progress Notes (Addendum)
PROGRESS NOTE        PATIENT DETAILS Name: Sonya Moran Age: 44 y.o. Sex: female Date of Birth: 11-Mar-1975 Admit Date: 06/15/2019 Admitting Physician Evalee Mutton Kristeen Mans, MD [id number], Myra Rude, MD  Brief Narrative: Patient is a 44 y.o. female with history of developmental delay, schizoaffective disorder, hypothyroidism who was just discharged from this hospital on 5/27 after being treated for AKI, lithium toxicity-was referred to the ED from her group home on 6/1 for evaluation of-she was found to have AKI-subsequently admitted to the hospitalist service.  Significant events: 6/1>> referred to the ED from group home-for decreased oral intake/lethargy-found to have AKI.  Significant studies: 6/1: CT head/C-spine>> no acute abnormality noted. 6/1: Chest x-ray>> mild cardiomegaly and pulmonary vascular congestion  Antimicrobial therapy: None  Microbiology data: None  Procedures : None  Consults: None  DVT Prophylaxis : Prophylactic Heparin   Subjective: Awake-answering simple questions.  No chest pain or shortness of breath.  Assessment/Plan: Acute metabolic encephalopathy: Secondary to AKI, dehydration.  Improving-suspect she is not far from her baseline.  CT imaging head negative for acute abnormalities.  Vitamin B12 within normal limits, RPR nonreactive.  Continue supportive care.  AKI on CKD stage IIIa: Although improved-continues to have AKI-in fact slightly worse than yesterday-resume IV fluid hydration-continue to avoid nephrotoxic agents.  Check frequent bladder scans and renal ultrasound.  Follow closely.    Addendum:  More than 700 cc of urine on bladder scan-has acute urinary retention causing AKI-Place Foley catheter.  Start Flomax.  Hypothyroidism: Continue Synthroid  Intellectual disability/Schizoaffective disorder: Appears stable-continue olanzapine, nortriptyline and Lamictal.  No longer on lithium given recent lithium  toxicity.  GERD: Continue PPI  Debility/deconditioning: Secondary to acute illness-evaluated by PT-recommendations are for home health services.  Diet: Diet Order            Diet regular Room service appropriate? Yes; Fluid consistency: Thin  Diet effective now               Code Status: Full code   Family Communication: Legal guardian over the phone on 6/2-we will update over the next few days.   Disposition Plan: Status is: Inpatient  The patient remains inpatient appropriate given requirement for IV fluids. Dispo: The patient is from: [place]              Anticipated d/c is to: Group Home              Anticipated d/c date is: 1 day-hopefully on 6/4 if clinical improvement continues.              Patient currently is not medically stable to d/c.   Barriers to Discharge: Persistent AKI requiring further work-up-IV fluids.  Antimicrobial agents: Anti-infectives (From admission, onward)   None       Time spent: 25- minutes-Greater than 50% of this time was spent in counseling, explanation of diagnosis, planning of further management, and coordination of care.  MEDICATIONS: Scheduled Meds: . amitriptyline  100 mg Oral QHS  . fenofibrate  54 mg Oral Daily  . heparin  5,000 Units Subcutaneous Q8H  . lamoTRIgine  200 mg Oral Daily  . levothyroxine  25 mcg Oral QAC breakfast  . linaclotide  145 mcg Oral QAC breakfast  . mirabegron ER  25 mg Oral Daily  . montelukast  10 mg Oral QHS  .  OLANZapine  15 mg Oral QHS  . OLANZapine  15 mg Oral QPC breakfast  . sodium chloride flush  3 mL Intravenous Q12H   Continuous Infusions: . sodium chloride 50 mL/hr at 06/17/19 0900   PRN Meds:.acetaminophen **OR** acetaminophen, fluticasone, LORazepam, ondansetron **OR** ondansetron (ZOFRAN) IV, phenol, senna-docusate   PHYSICAL EXAM: Vital signs: Vitals:   06/16/19 1706 06/16/19 2107 06/17/19 0409 06/17/19 0912  BP: 136/86 130/76 119/79 (!) 152/88  Pulse: 85 82 80 97   Resp: 18 18 16 20   Temp: 98.2 F (36.8 C) 98 F (36.7 C) 98 F (36.7 C) 98.9 F (37.2 C)  TempSrc: Oral Oral Oral Oral  SpO2: 95% 94% 92% 95%  Weight:      Height:       Filed Weights   06/15/19 2124 06/16/19 0200  Weight: 72 kg 70.3 kg   Body mass index is 32.39 kg/m.   Gen Exam:Alert awake-not in any distress HEENT:atraumatic, normocephalic Chest: B/L clear to auscultation anteriorly CVS:S1S2 regular Abdomen:soft non tender, non distended Extremities:no edema Neurology: Non focal Skin: no rash  I have personally reviewed following labs and imaging studies  LABORATORY DATA: CBC: Recent Labs  Lab 06/15/19 2121 06/15/19 2159 06/16/19 0635  WBC 6.6  --  5.6  NEUTROABS 4.7  --   --   HGB 11.1* 11.6* 11.1*  HCT 36.6 34.0* 36.4  MCV 97.6  --  98.4  PLT 147*  --  135*    Basic Metabolic Panel: Recent Labs  Lab 06/15/19 2121 06/15/19 2159 06/16/19 0635 06/17/19 0351  NA 144 145 143 144  K 4.3 4.1 3.7 3.6  CL 106  --  110 112*  CO2 26  --  24 25  GLUCOSE 92  --  105* 102*  BUN 20  --  15 13  CREATININE 1.60*  --  1.46* 1.50*  CALCIUM 10.2  --  9.6 9.6    GFR: Estimated Creatinine Clearance: 39.4 mL/min (A) (by C-G formula based on SCr of 1.5 mg/dL (H)).  Liver Function Tests: Recent Labs  Lab 06/15/19 2121 06/16/19 0635 06/17/19 0351  AST 44* 44* 54*  ALT 34 32 39  ALKPHOS 78 74 65  BILITOT 0.6 0.4 0.7  PROT 6.6 6.2* 6.0*  ALBUMIN 3.4* 3.3* 2.9*   No results for input(s): LIPASE, AMYLASE in the last 168 hours. Recent Labs  Lab 06/15/19 2120  AMMONIA 25    Coagulation Profile: No results for input(s): INR, PROTIME in the last 168 hours.  Cardiac Enzymes: No results for input(s): CKTOTAL, CKMB, CKMBINDEX, TROPONINI in the last 168 hours.  BNP (last 3 results) No results for input(s): PROBNP in the last 8760 hours.  Lipid Profile: No results for input(s): CHOL, HDL, LDLCALC, TRIG, CHOLHDL, LDLDIRECT in the last 72 hours.  Thyroid  Function Tests: No results for input(s): TSH, T4TOTAL, FREET4, T3FREE, THYROIDAB in the last 72 hours.  Anemia Panel: Recent Labs    06/16/19 0635  VITAMINB12 903  FOLATE 12.9    Urine analysis:    Component Value Date/Time   COLORURINE YELLOW 06/04/2019 1913   APPEARANCEUR CLEAR 06/04/2019 1913   LABSPEC 1.010 06/04/2019 1913   PHURINE 6.0 06/04/2019 1913   GLUCOSEU NEGATIVE 06/04/2019 [place] 06/04/2019 [place] 06/04/2019 [place] 06/04/2019 1913   PROTEINUR NEGATIVE 06/04/2019 1913   UROBILINOGEN 0.2 09/26/2010 1716   NITRITE NEGATIVE 06/04/2019 1913   LEUKOCYTESUR LARGE (A) 06/04/2019 1913  Sepsis Labs: Lactic Acid, Venous No results found for: LATICACIDVEN  MICROBIOLOGY: Recent Results (from the past 240 hour(s))  SARS Coronavirus 2 by RT PCR (hospital order, performed in [facility] hospital lab) Nasopharyngeal Nasopharyngeal Swab     Status: None   Collection Time: 06/15/19  9:41 PM   Specimen: Nasopharyngeal Swab  Result Value Ref Range Status   SARS Coronavirus 2 NEGATIVE NEGATIVE Final    Comment: (NOTE) SARS-CoV-2 target nucleic acids are NOT DETECTED. The SARS-CoV-2 RNA is generally detectable in upper and lower respiratory specimens during the acute phase of infection. The lowest concentration of SARS-CoV-2 viral copies this assay can detect is 250 copies / mL. A negative result does not preclude SARS-CoV-2 infection and should not be used as the sole basis for treatment or other patient management decisions.  A negative result may occur with improper specimen collection / handling, submission of specimen other than nasopharyngeal swab, presence of viral mutation(s) within the areas targeted by this assay, and inadequate number of viral copies (<250 copies / mL). A negative result must be combined with clinical observations, patient history, and epidemiological information. Fact Sheet for Patients:    StrictlyIdeas.no Fact Sheet for Healthcare Providers: BankingDealers.co.za This test is not yet approved or cleared  by the [place] FDA and has been authorized for detection and/or diagnosis of SARS-CoV-2 by FDA under an Emergency Use Authorization (EUA).  This EUA will remain in effect (meaning this test can be used) for the duration of the COVID-19 declaration under Section 564(b)(1) of the Act, 21 U.S.C. section 360bbb-3(b)(1), unless the authorization is terminated or revoked sooner. Performed at [facility], [place] [address], [place], [place] [address]     RADIOLOGY STUDIES/RESULTS: CT Head Wo Contrast  Result Date: 06/15/2019 CLINICAL DATA:  Altered mental status and recent falls EXAM: CT HEAD WITHOUT CONTRAST CT CERVICAL SPINE WITHOUT CONTRAST TECHNIQUE: Multidetector CT imaging of the head and cervical spine was performed following the standard protocol without intravenous contrast. Multiplanar CT image reconstructions of the cervical spine were also generated. COMPARISON:  06/04/2019 FINDINGS: CT HEAD FINDINGS Brain: No evidence of acute infarction, hemorrhage, hydrocephalus, extra-axial collection or mass lesion/mass effect. Vascular: No hyperdense vessel or unexpected calcification. Skull: Normal. Negative for fracture or focal lesion. Sinuses/Orbits: No acute finding. Other: None. CT CERVICAL SPINE FINDINGS Alignment: Within normal limits. Skull base and vertebrae: 7 cervical segments are well visualized. Vertebral body height is well maintained. Osteophytic changes are noted most prominent at C5-6. Mild facet hypertrophic changes are noted. No acute fracture or acute facet abnormality is noted. Soft tissues and spinal canal: Surrounding soft tissue structures are within normal limits. Upper chest: Visualized lung apices are unremarkable. Other: None IMPRESSION: CT of the head: No acute intracranial abnormality noted. CT of the  cervical spine: No acute abnormality is noted. Mild degenerative changes are seen primarily at C5-6. Electronically Signed   By: Inez Catalina M.D.   On: 06/15/2019 22:59   CT Cervical Spine Wo Contrast  Result Date: 06/15/2019 CLINICAL DATA:  Altered mental status and recent falls EXAM: CT HEAD WITHOUT CONTRAST CT CERVICAL SPINE WITHOUT CONTRAST TECHNIQUE: Multidetector CT imaging of the head and cervical spine was performed following the standard protocol without intravenous contrast. Multiplanar CT image reconstructions of the cervical spine were also generated. COMPARISON:  06/04/2019 FINDINGS: CT HEAD FINDINGS Brain: No evidence of acute infarction, hemorrhage, hydrocephalus, extra-axial collection or mass lesion/mass effect. Vascular: No hyperdense vessel or unexpected calcification. Skull: Normal. Negative for  fracture or focal lesion. Sinuses/Orbits: No acute finding. Other: None. CT CERVICAL SPINE FINDINGS Alignment: Within normal limits. Skull base and vertebrae: 7 cervical segments are well visualized. Vertebral body height is well maintained. Osteophytic changes are noted most prominent at C5-6. Mild facet hypertrophic changes are noted. No acute fracture or acute facet abnormality is noted. Soft tissues and spinal canal: Surrounding soft tissue structures are within normal limits. Upper chest: Visualized lung apices are unremarkable. Other: None IMPRESSION: CT of the head: No acute intracranial abnormality noted. CT of the cervical spine: No acute abnormality is noted. Mild degenerative changes are seen primarily at C5-6. Electronically Signed   By: Inez Catalina M.D.   On: 06/15/2019 22:59   DG Chest Port 1 View  Result Date: 06/15/2019 CLINICAL DATA:  Change in mental status EXAM: PORTABLE CHEST 1 VIEW COMPARISON:  Jun 04, 2019 FINDINGS: The heart size and mediastinal contours are mildly enlarged as on prior exam. Bibasilar subsegmental atelectasis is noted. There is elevation of the left  hemidiaphragm. There is mild prominence of the central pulmonary vasculature. No acute osseous abnormality. The visualized skeletal structures are unremarkable. IMPRESSION: Mild cardiomegaly and pulmonary vascular congestion. Electronically Signed   By: Prudencio Pair M.D.   On: 06/15/2019 22:03     LOS: 1 day   Oren Binet, MD  [facility]    To contact the attending provider between 7A-7P or the covering provider during after hours 7P-7A, please log into the web site [email or web address] and access using universal  password for that web site. If you do not have the password, please call the hospital operator.  06/17/2019, 12:14 PM

## 2019-06-17 NOTE — Plan of Care (Signed)
  Problem: Clinical Measurements: Goal: Ability to maintain clinical measurements within normal limits will improve Outcome: Completed/Met Goal: Will remain free from infection Outcome: Completed/Met Goal: Diagnostic test results will improve Outcome: Completed/Met Goal: Cardiovascular complication will be avoided Outcome: Completed/Met   Problem: Activity: Goal: Risk for activity intolerance will decrease Outcome: Completed/Met   Problem: Nutrition: Goal: Adequate nutrition will be maintained Outcome: Completed/Met   Problem: Coping: Goal: Level of anxiety will decrease Outcome: Completed/Met   Problem: Elimination: Goal: Will not experience complications related to urinary retention Outcome: Completed/Met   Problem: Pain Managment: Goal: General experience of comfort will improve Outcome: Completed/Met   Problem: Skin Integrity: Goal: Risk for impaired skin integrity will decrease Outcome: Completed/Met

## 2019-06-18 LAB — COMPREHENSIVE METABOLIC PANEL
ALT: 44 U/L (ref 0–44)
AST: 55 U/L — ABNORMAL HIGH (ref 15–41)
Albumin: 3.1 g/dL — ABNORMAL LOW (ref 3.5–5.0)
Alkaline Phosphatase: 70 U/L (ref 38–126)
Anion gap: 10 (ref 5–15)
BUN: 11 mg/dL (ref 6–20)
CO2: 23 mmol/L (ref 22–32)
Calcium: 9.7 mg/dL (ref 8.9–10.3)
Chloride: 110 mmol/L (ref 98–111)
Creatinine, Ser: 1.41 mg/dL — ABNORMAL HIGH (ref 0.44–1.00)
GFR calc Af Amer: 52 mL/min — ABNORMAL LOW (ref >60)
GFR calc non Af Amer: 45 mL/min — ABNORMAL LOW (ref >60)
Glucose, Bld: 120 mg/dL — ABNORMAL HIGH (ref 70–99)
Potassium: 3.9 mmol/L (ref 3.5–5.1)
Sodium: 143 mmol/L (ref 135–145)
Total Bilirubin: 0.5 mg/dL (ref 0.3–1.2)
Total Protein: 6.1 g/dL — ABNORMAL LOW (ref 6.5–8.1)

## 2019-06-18 NOTE — Progress Notes (Signed)
PROGRESS NOTE        PATIENT DETAILS Name: Sonya Moran Age: 44 y.o. Sex: female Date of Birth: May 31, 1975 Admit Date: 06/15/2019 Admitting Physician Evalee Mutton Kristeen Mans, MD [id number], Myra Rude, MD  Brief Narrative: Patient is a 44 y.o. female with history of developmental delay, schizoaffective disorder, hypothyroidism who was just discharged from this hospital on 5/27 after being treated for AKI, lithium toxicity-was referred to the ED from her group home on 6/1 for evaluation of-she was found to have AKI-subsequently admitted to the hospitalist service.  Significant events: 6/1>> referred to the ED from group home-for decreased oral intake/lethargy-found to have AKI. 6/3>> acute urinary retention requiring Foley catheter insertion-Flomax started.  Significant studies: 6/1: CT head/C-spine>> no acute abnormality noted. 6/1: Chest x-ray>> mild cardiomegaly and pulmonary vascular congestion 6/3: Renal ultrasound>> no hydronephrosis-small right renal cyst.  Antimicrobial therapy: None  Microbiology data: None  Procedures : None  Consults: None  DVT Prophylaxis : Prophylactic Heparin   Subjective: At baseline-answers simple questions. Per nursing staff no major events overnight.  Assessment/Plan: Acute metabolic encephalopathy: Secondary to AKI, dehydration.  Improved with supportive care-suspect she is close to her baseline.   CT imaging head negative for acute abnormalities.  Vitamin B12 within normal limits, RPR nonreactive.  Continue supportive care.  AKI on CKD stage IIIa: Felt to be multifactorial-from urinary retention (more than 700 cc on bladder scan on 6/3), hemodynamic mediated kidney injury in the setting of dehydration. Foley catheter placed 6/3-started Flomax-renal function gradually improving-continue Foley catheter for another day or so-we'll attempt a voiding trial prior to discharge once renal function improves further.  Hypothyroidism:  Continue Synthroid  Intellectual disability/Schizoaffective disorder: Appears stable-continue olanzapine, nortriptyline and Lamictal.  No longer on lithium given recent lithium toxicity.  GERD: Continue PPI  Debility/deconditioning: Secondary to acute illness-evaluated by PT-recommendations are for home health services.  Diet: Diet Order            Diet regular Room service appropriate? Yes; Fluid consistency: Thin  Diet effective now               Code Status: Full code   Family Communication: Legal guardian Gordy Savers) [phone] left voicemail on 6/3  Disposition Plan: Status is: Inpatient  The patient remains inpatient appropriate given requirement for IV fluids. Dispo: The patient is from: [place]              Anticipated d/c is to: Group Home              Anticipated d/c date is: 1 day-hopefully on 6/4 if clinical improvement continues.              Patient currently is not medically stable to d/c.   Barriers to Discharge: Persistent AKI-although improving-but not yet at baseline-needs a voiding trial prior to discharge.  Antimicrobial agents: Anti-infectives (From admission, onward)   None       Time spent: 25- minutes-Greater than 50% of this time was spent in counseling, explanation of diagnosis, planning of further management, and coordination of care.  MEDICATIONS: Scheduled Meds: . amitriptyline  100 mg Oral QHS  . Chlorhexidine Gluconate Cloth  6 each Topical Daily  . fenofibrate  54 mg Oral Daily  . heparin  5,000 Units Subcutaneous Q8H  . lamoTRIgine  200 mg Oral Daily  . levothyroxine  25 mcg Oral  QAC breakfast  . linaclotide  145 mcg Oral QAC breakfast  . mirabegron ER  25 mg Oral Daily  . montelukast  10 mg Oral QHS  . OLANZapine  15 mg Oral QHS  . OLANZapine  15 mg Oral QPC breakfast  . sodium chloride flush  3 mL Intravenous Q12H  . tamsulosin  0.4 mg Oral Daily   Continuous Infusions:  PRN Meds:.acetaminophen **OR**  acetaminophen, fluticasone, guaiFENesin-dextromethorphan, LORazepam, ondansetron **OR** ondansetron (ZOFRAN) IV, phenol, senna-docusate   PHYSICAL EXAM: Vital signs: Vitals:   06/17/19 1643 06/17/19 2033 06/18/19 0446 06/18/19 0858  BP: 131/78 129/87 110/81 122/80  Pulse: 93 97 96 92  Resp: 18 19 18 18   Temp: 99.7 F (37.6 C) 99.2 F (37.3 C) 97.7 F (36.5 C) 97.8 F (36.6 C)  TempSrc: Oral Oral Oral Oral  SpO2: 92% 93% 92% 96%  Weight:      Height:       Filed Weights   06/15/19 2124 06/16/19 0200  Weight: 72 kg 70.3 kg   Body mass index is 32.39 kg/m.   Gen Exam:Alert awake-not in any distress HEENT:atraumatic, normocephalic Chest: B/L clear to auscultation anteriorly CVS:S1S2 regular Abdomen:soft non tender, non distended Extremities:no edema Neurology: Non focal Skin: no rash  I have personally reviewed following labs and imaging studies  LABORATORY DATA: CBC: Recent Labs  Lab 06/15/19 2121 06/15/19 2159 06/16/19 0635  WBC 6.6  --  5.6  NEUTROABS 4.7  --   --   HGB 11.1* 11.6* 11.1*  HCT 36.6 34.0* 36.4  MCV 97.6  --  98.4  PLT 147*  --  135*    Basic Metabolic Panel: Recent Labs  Lab 06/15/19 2121 06/15/19 2159 06/16/19 0635 06/17/19 0351 06/18/19 0608  NA 144 145 143 144 143  K 4.3 4.1 3.7 3.6 3.9  CL 106  --  110 112* 110  CO2 26  --  24 25 23   GLUCOSE 92  --  105* 102* 120*  BUN 20  --  15 13 11   CREATININE 1.60*  --  1.46* 1.50* 1.41*  CALCIUM 10.2  --  9.6 9.6 9.7    GFR: Estimated Creatinine Clearance: 41.9 mL/min (A) (by C-G formula based on SCr of 1.41 mg/dL (H)).  Liver Function Tests: Recent Labs  Lab 06/15/19 2121 06/16/19 0635 06/17/19 0351 06/18/19 0608  AST 44* 44* 54* 55*  ALT 34 32 39 44  ALKPHOS 78 74 65 70  BILITOT 0.6 0.4 0.7 0.5  PROT 6.6 6.2* 6.0* 6.1*  ALBUMIN 3.4* 3.3* 2.9* 3.1*   No results for input(s): LIPASE, AMYLASE in the last 168 hours. Recent Labs  Lab 06/15/19 2120  AMMONIA 25     Coagulation Profile: No results for input(s): INR, PROTIME in the last 168 hours.  Cardiac Enzymes: No results for input(s): CKTOTAL, CKMB, CKMBINDEX, TROPONINI in the last 168 hours.  BNP (last 3 results) No results for input(s): PROBNP in the last 8760 hours.  Lipid Profile: No results for input(s): CHOL, HDL, LDLCALC, TRIG, CHOLHDL, LDLDIRECT in the last 72 hours.  Thyroid Function Tests: No results for input(s): TSH, T4TOTAL, FREET4, T3FREE, THYROIDAB in the last 72 hours.  Anemia Panel: Recent Labs    06/16/19 0635  VITAMINB12 903  FOLATE 12.9    Urine analysis:    Component Value Date/Time   COLORURINE YELLOW 06/04/2019 1913   APPEARANCEUR CLEAR 06/04/2019 1913   LABSPEC 1.010 06/04/2019 1913   PHURINE 6.0 06/04/2019 1913   GLUCOSEU NEGATIVE  06/04/2019 [place] 06/04/2019 [place] 06/04/2019 [place] 06/04/2019 1913   PROTEINUR NEGATIVE 06/04/2019 1913   UROBILINOGEN 0.2 09/26/2010 1716   NITRITE NEGATIVE 06/04/2019 1913   LEUKOCYTESUR LARGE (A) 06/04/2019 1913    Sepsis Labs: Lactic Acid, Venous No results found for: LATICACIDVEN  MICROBIOLOGY: Recent Results (from the past 240 hour(s))  SARS Coronavirus 2 by RT PCR (hospital order, performed in [place] hospital lab) Nasopharyngeal Nasopharyngeal Swab     Status: None   Collection Time: 06/15/19  9:41 PM   Specimen: Nasopharyngeal Swab  Result Value Ref Range Status   SARS Coronavirus 2 NEGATIVE NEGATIVE Final    Comment: (NOTE) SARS-CoV-2 target nucleic acids are NOT DETECTED. The SARS-CoV-2 RNA is generally detectable in upper and lower respiratory specimens during the acute phase of infection. The lowest concentration of SARS-CoV-2 viral copies this assay can detect is 250 copies / mL. A negative result does not preclude SARS-CoV-2 infection and should not be used as the sole basis for treatment or other patient management decisions.  A  negative result may occur with improper specimen collection / handling, submission of specimen other than nasopharyngeal swab, presence of viral mutation(s) within the areas targeted by this assay, and inadequate number of viral copies (<250 copies / mL). A negative result must be combined with clinical observations, patient history, and epidemiological information. Fact Sheet for Patients:   StrictlyIdeas.no Fact Sheet for Healthcare Providers: BankingDealers.co.za This test is not yet approved or cleared  by the [place] FDA and has been authorized for detection and/or diagnosis of SARS-CoV-2 by FDA under an Emergency Use Authorization (EUA).  This EUA will remain in effect (meaning this test can be used) for the duration of the COVID-19 declaration under Section 564(b)(1) of the Act, 21 U.S.C. section 360bbb-3(b)(1), unless the authorization is terminated or revoked sooner. Performed at [facility], [place] [address] [place], [place] [address]     RADIOLOGY STUDIES/RESULTS: US RENAL  Result Date: 06/17/2019 CLINICAL DATA:  Acute renal injury EXAM: RENAL / URINARY TRACT ULTRASOUND COMPLETE COMPARISON:  None. FINDINGS: Right Kidney: Renal measurements: 10.6 x 4.4 x 4.2 cm. = volume: 102 mL. No mass lesion or hydronephrosis is noted. Small 12 mm cyst is noted in the lower pole. Left Kidney: Renal measurements: 10.0 x 4.6 x 3.6 cm = volume: 88 mL. Echogenicity within normal limits. No mass or hydronephrosis visualized. Bladder: Decompressed by Foley catheter. Other: None. IMPRESSION: Small right renal cyst.  No obstructive changes are noted. Electronically Signed   By: Inez Catalina M.D.   On: 06/17/2019 21:43     LOS: 2 days   Oren Binet, MD  [facility]    To contact the attending provider between 7A-7P or the covering provider during after hours 7P-7A, please log into the web site [email or web address] and access using  universal  password for that web site. If you do not have the password, please call the hospital operator.  06/18/2019, 2:33 PM

## 2019-06-18 NOTE — Progress Notes (Signed)
Physical Therapy Treatment Patient Details Name: Sonya Moran MRN: [id number] DOB: 1976-01-05 Today's Date: 06/18/2019    History of Present Illness Pt is a 44 y/o female admitted 06/15/19 from her [place] secondary to lethargy; worked up for acute encephalopathy secondary to AKI, dehydration. PMH including but not limited to developmental delay, CKD, hypothyroidism, PTSD, schizoaffective disorder.   PT Comments    Pt with c/o nausea and stomach pain this session, also demonstrates increased fatigue with limited mobility. Pt with inconsistent sitting balance, requiring assist to keep from laying down once sitting EOB, but able to maintain balance well long sitting in bed to perform functional task. MinA to stand and maintain balance, pt declined further mobility with impulsive return to bed due to c/o pain, declining further mobility. Pt hopeful for d/c home soon.    Follow Up Recommendations  Home health PT;Supervision/Assistance - 24 hour     Equipment Recommendations  None recommended by PT    Recommendations for Other Services       Precautions / Restrictions Precautions Precautions: Fall Restrictions Weight Bearing Restrictions: No    Mobility  Bed Mobility Overal bed mobility: Needs Assistance Bed Mobility: Supine to Sit;Sit to Supine     Supine to sit: Min assist Sit to supine: Min assist   General bed mobility comments: MinA for LE management, frequent cues for sequencing and to attend to task; once supine, mod independent to come to long sitting from elevated HOB when needing to cough and wash face  Transfers Overall transfer level: Needs assistance   Transfers: Sit to/from Stand Sit to Stand: Min assist         General transfer comment: MinA to maintain balance with standing; max cues to keep pt from sitting, only tolerating standing ~1 min  Ambulation/Gait             General Gait Details: attempted to take side steps at EOB but pt impulsively  sitting back down and wanting to lie back down in bed, c/o nausea   Stairs             Wheelchair Mobility    Modified Rankin (Stroke Patients Only)       Balance Overall balance assessment: Needs assistance Sitting-balance support: Feet supported Sitting balance-Leahy Scale: Poor Sitting balance - Comments: L lateral lean and anterior lean, requiring assist to maintain upright, pt reports due to pain; unsure if related to fatigue or motivation; demonstrates improved balance with long sitting in bed for functional task (washing face)     Standing balance-Leahy Scale: Poor Standing balance comment: Reliant on UE support                            Cognition Arousal/Alertness: Awake/alert Behavior During Therapy: Flat affect Overall Cognitive Status: History of cognitive impairments - at baseline Area of Impairment: Problem solving;Awareness;Safety/judgement;Memory;Following commands                     Memory: Decreased short-term memory Following Commands: Follows one step commands with increased time;Follows one step commands inconsistently Safety/Judgement: Decreased awareness of deficits;Decreased awareness of safety Awareness: Intellectual Problem Solving: Slow processing;Decreased initiation;Difficulty sequencing;Requires verbal cues;Requires tactile cues        Exercises      General Comments        Pertinent Vitals/Pain Pain Assessment: Faces Faces Pain Scale: Hurts little more Pain Location: Stomach/nausea Pain Descriptors / Indicators: Discomfort;Guarding Pain Intervention(s): Monitored during  session    Home Living                      Prior Function            PT Goals (current goals can now be found in the care plan section) Progress towards PT goals: Not progressing toward goals - comment(fatigue/nausea)    Frequency    Min 3X/week      PT Plan Current plan remains appropriate    Co-evaluation               AM-PAC PT "6 Clicks" Mobility   Outcome Measure  Help needed turning from your back to your side while in a flat bed without using bedrails?: None Help needed moving from lying on your back to sitting on the side of a flat bed without using bedrails?: A Little Help needed moving to and from a bed to a chair (including a wheelchair)?: A Little Help needed standing up from a chair using your arms (e.g., wheelchair or bedside chair)?: A Little Help needed to walk in hospital room?: A Little Help needed climbing 3-5 steps with a railing? : A Little 6 Click Score: 19    End of Session   Activity Tolerance: Patient limited by fatigue Patient left: in bed;with call bell/phone within reach;with bed alarm set Nurse Communication: Mobility status PT Visit Diagnosis: Other abnormalities of gait and mobility (R26.89)     Time: 3704-8889 PT Time Calculation (min) (ACUTE ONLY): 16 min  Charges:  $Therapeutic Activity: 8-22 mins                    Mabeline Caras, PT, DPT Acute Rehabilitation Services  Pager [phone] Office [place] 06/18/2019, 9:56 AM

## 2019-06-19 LAB — COMPREHENSIVE METABOLIC PANEL
ALT: 47 U/L — ABNORMAL HIGH (ref 0–44)
AST: 55 U/L — ABNORMAL HIGH (ref 15–41)
Albumin: 3 g/dL — ABNORMAL LOW (ref 3.5–5.0)
Alkaline Phosphatase: 70 U/L (ref 38–126)
Anion gap: 8 (ref 5–15)
BUN: 10 mg/dL (ref 6–20)
CO2: 24 mmol/L (ref 22–32)
Calcium: 9.7 mg/dL (ref 8.9–10.3)
Chloride: 114 mmol/L — ABNORMAL HIGH (ref 98–111)
Creatinine, Ser: 1.38 mg/dL — ABNORMAL HIGH (ref 0.44–1.00)
GFR calc Af Amer: 53 mL/min — ABNORMAL LOW (ref >60)
GFR calc non Af Amer: 46 mL/min — ABNORMAL LOW (ref >60)
Glucose, Bld: 108 mg/dL — ABNORMAL HIGH (ref 70–99)
Potassium: 3.2 mmol/L — ABNORMAL LOW (ref 3.5–5.1)
Sodium: 146 mmol/L — ABNORMAL HIGH (ref 135–145)
Total Bilirubin: 0.5 mg/dL (ref 0.3–1.2)
Total Protein: 6.2 g/dL — ABNORMAL LOW (ref 6.5–8.1)

## 2019-06-19 MED ORDER — DEXTROSE 5 % IV SOLN: INTRAVENOUS | Status: DC

## 2019-06-19 MED ORDER — POTASSIUM CHLORIDE CRYS ER 20 MEQ PO TBCR
40.0000 meq | EXTENDED_RELEASE_TABLET | Freq: Once | ORAL | Status: AC
  Administered 2019-06-19: 40 meq via ORAL
  Filled 2019-06-19: qty 2

## 2019-06-19 NOTE — Progress Notes (Signed)
[facility]  PROGRESS NOTE  Sonya Moran KNL:[id number] DOB: 12/13/1975 DOA: 06/15/2019 PCP: Lucianne Lei, MD   Brief HPI:   44 year old female with history of developmental delay, schizoaffective disorder, hypothyroidism who was just discharged from the hospital on 06/10/2019 after being treated for AKI, lithium toxicity.  She was referred to ED from her group home on 06/15/2019 for lethargy, decreased oral intake.  She was found to have AKI and admitted to hospital service.  She was found to have acute urinary tension, Foley catheter was inserted and Flomax was started.    Subjective   Patient seen and examined, denies any complaints.   Assessment/Plan:     1. Acute metabolic encephalopathy-resolved, secondary to poor p.o. intake, AKI.  CT head was negative for acute abnormality.  Vitamin B12 within normal limits, RPR nonreactive. 2. Acute kidney injury on CKD stage III-likely multifactorial from poor p.o. intake, also found to have urine retention.  Foley catheter was inserted on 06/17/2019, started on Flomax.  Renal ultrasound showed no hydronephrosis.  Kidney function is slowly improving.  Still not back to baseline.  She will need voiding trial before discharge.  Follow BMP in a.m. 3. Hypothyroidism-continue Synthroid 4. Intellectual disability/seizure disorder-stable, continue olanzapine, nortriptyline, labetalol.  Patient is not on lithium given recent lithium toxicity. 5. GERD continue PPI. 6. Hypernatremia-mild, sodium is slowly increasing.  Today sodium is 146.  Will start D5W at 50 mL/h for 12 hours.  Follow BMP in a.m. 7. Hypokalemia-potassium is 3.2, will replace potassium and follow BMP in a.m. 8. Debility/deconditioning-PT recommends home health PT     SpO2: 92 %   COVID-19 Labs  No results for input(s): DDIMER, FERRITIN, LDH, CRP in the last 72 hours.  Lab Results  Component Value Date   SARSCOV2NAA NEGATIVE 06/15/2019   [place] NEGATIVE 06/04/2019    SARSCOV2NAA NOT DETECTED 07/08/2018     CBG: No results for input(s): GLUCAP in the last 168 hours.  CBC: Recent Labs  Lab 06/15/19 2121 06/15/19 2159 06/16/19 0635  WBC 6.6  --  5.6  NEUTROABS 4.7  --   --   HGB 11.1* 11.6* 11.1*  HCT 36.6 34.0* 36.4  MCV 97.6  --  98.4  PLT 147*  --  135*    Basic Metabolic Panel: Recent Labs  Lab 06/15/19 2121 06/15/19 2121 06/15/19 2159 06/16/19 0635 06/17/19 0351 06/18/19 0608 06/19/19 0555  NA 144   < > 145 143 144 143 146*  K 4.3   < > 4.1 3.7 3.6 3.9 3.2*  CL 106  --   --  110 112* 110 114*  CO2 26  --   --  24 25 23 24   GLUCOSE 92  --   --  105* 102* 120* 108*  BUN 20  --   --  15 13 11 10   CREATININE 1.60*  --   --  1.46* 1.50* 1.41* 1.38*  CALCIUM 10.2  --   --  9.6 9.6 9.7 9.7   < > = values in this interval not displayed.     Liver Function Tests: Recent Labs  Lab 06/15/19 2121 06/16/19 0635 06/17/19 0351 06/18/19 0608 06/19/19 0555  AST 44* 44* 54* 55* 55*  ALT 34 32 39 44 47*  ALKPHOS 78 74 65 70 70  BILITOT 0.6 0.4 0.7 0.5 0.5  PROT 6.6 6.2* 6.0* 6.1* 6.2*  ALBUMIN 3.4* 3.3* 2.9* 3.1* 3.0*        DVT prophylaxis: Heparin  Code Status:  Full code  Family Communication: No family at bedside    Status is: Inpatient  Dispo: The patient is from: Group home              Anticipated d/c is to: Group home              Anticipated d/c date is: 06/21/2019              Patient currently not medically stable for discharge  Barrier to discharge-persistent AKI, improving.  She will need voiding trial prior to discharge.        Scheduled medications:  . amitriptyline  100 mg Oral QHS  . Chlorhexidine Gluconate Cloth  6 each Topical Daily  . fenofibrate  54 mg Oral Daily  . heparin  5,000 Units Subcutaneous Q8H  . lamoTRIgine  200 mg Oral Daily  . levothyroxine  25 mcg Oral QAC breakfast  . linaclotide  145 mcg Oral QAC breakfast  . mirabegron ER  25 mg Oral Daily  . montelukast  10 mg Oral  QHS  . OLANZapine  15 mg Oral QHS  . OLANZapine  15 mg Oral QPC breakfast  . sodium chloride flush  3 mL Intravenous Q12H  . tamsulosin  0.4 mg Oral Daily    Consultants:  None  Procedures:  None  Antibiotics:   Anti-infectives (From admission, onward)   None       Objective   Vitals:   06/18/19 1628 06/18/19 2024 06/19/19 0431 06/19/19 0837  BP: 136/74 115/77 (!) 146/93 122/83  Pulse: (!) 106 88 93 89  Resp: 18 18 18 20   Temp: 98.6 F (37 C) (!) 97.4 F (36.3 C) 98.6 F (37 C) 98.4 F (36.9 C)  TempSrc: Oral Oral Oral Oral  SpO2: 93% 94% 92% 92%  Weight:  68.9 kg    Height:        Intake/Output Summary (Last 24 hours) at 06/19/2019 1330 Last data filed at 06/19/2019 1251 Gross per 24 hour  Intake 1100 ml  Output 2250 ml  Net -1150 ml    06/03 1901 - 06/05 0700 In: 1259.4 [P.O.:642; I.V.:617.4] Out: 3400 [Urine:3400]  Filed Weights   06/15/19 2124 06/16/19 0200 06/18/19 2024  Weight: 72 kg 70.3 kg 68.9 kg    Physical Examination:    General: Appears in no acute distress  Cardiovascular: S1-S2, regular, no murmur auscultated  Respiratory: Clear to auscultation bilaterally  Abdomen: Abdomen is soft, nontender, no organomegaly  Extremities: No edema in the lower extremities  Neurologic: Alert, oriented x3, no focal deficit noted    Data Reviewed:   Recent Results (from the past 240 hour(s))  SARS Coronavirus 2 by RT PCR (hospital order, performed in [place] hospital lab) Nasopharyngeal Nasopharyngeal Swab     Status: None   Collection Time: 06/15/19  9:41 PM   Specimen: Nasopharyngeal Swab  Result Value Ref Range Status   SARS Coronavirus 2 NEGATIVE NEGATIVE Final    Comment: (NOTE) SARS-CoV-2 target nucleic acids are NOT DETECTED. The SARS-CoV-2 RNA is generally detectable in upper and lower respiratory specimens during the acute phase of infection. The lowest concentration of SARS-CoV-2 viral copies this assay can detect is  250 copies / mL. A negative result does not preclude SARS-CoV-2 infection and should not be used as the sole basis for treatment or other patient management decisions.  A negative result may occur with improper specimen collection / handling, submission of specimen other than nasopharyngeal swab, presence of viral mutation(s)  within the areas targeted by this assay, and inadequate number of viral copies (<250 copies / mL). A negative result must be combined with clinical observations, patient history, and epidemiological information. Fact Sheet for Patients:   StrictlyIdeas.no Fact Sheet for Healthcare Providers: BankingDealers.co.za This test is not yet approved or cleared  by the [place] FDA and has been authorized for detection and/or diagnosis of SARS-CoV-2 by FDA under an Emergency Use Authorization (EUA).  This EUA will remain in effect (meaning this test can be used) for the duration of the COVID-19 declaration under Section 564(b)(1) of the Act, 21 U.S.C. section 360bbb-3(b)(1), unless the authorization is terminated or revoked sooner. Performed at [facility], [place] [address], [place], [place] [address]     No results for input(s): LIPASE, AMYLASE in the last 168 hours. Recent Labs  Lab 06/15/19 2120  AMMONIA 25   BNP (last 3 results) Recent Labs    06/15/19 2345  BNP 20.4     Studies:  US RENAL  Result Date: 07-11-19 CLINICAL DATA:  Acute renal injury EXAM: RENAL / URINARY TRACT ULTRASOUND COMPLETE COMPARISON:  None. FINDINGS: Right Kidney: Renal measurements: 10.6 x 4.4 x 4.2 cm. = volume: 102 mL. No mass lesion or hydronephrosis is noted. Small 12 mm cyst is noted in the lower pole. Left Kidney: Renal measurements: 10.0 x 4.6 x 3.6 cm = volume: 88 mL. Echogenicity within normal limits. No mass or hydronephrosis visualized. Bladder: Decompressed by Foley catheter. Other: None. IMPRESSION: Small right renal  cyst.  No obstructive changes are noted. Electronically Signed   By: Inez Catalina M.D.   On: 11-Jul-2019 21:43       [place]   [facility] If 7PM-7AM, please contact night-coverage at [email or web address], Office  [phone]   06/19/2019, 1:30 PM  LOS: 3 days

## 2019-06-20 LAB — BASIC METABOLIC PANEL
Anion gap: 12 (ref 5–15)
BUN: 10 mg/dL (ref 6–20)
CO2: 23 mmol/L (ref 22–32)
Calcium: 10 mg/dL (ref 8.9–10.3)
Chloride: 108 mmol/L (ref 98–111)
Creatinine, Ser: 1.43 mg/dL — ABNORMAL HIGH (ref 0.44–1.00)
GFR calc Af Amer: 51 mL/min — ABNORMAL LOW (ref >60)
GFR calc non Af Amer: 44 mL/min — ABNORMAL LOW (ref >60)
Glucose, Bld: 124 mg/dL — ABNORMAL HIGH (ref 70–99)
Potassium: 3.3 mmol/L — ABNORMAL LOW (ref 3.5–5.1)
Sodium: 143 mmol/L (ref 135–145)

## 2019-06-20 LAB — CBC
HCT: 36.4 % (ref 36.0–46.0)
Hemoglobin: 11.1 g/dL — ABNORMAL LOW (ref 12.0–15.0)
MCH: 29.4 pg (ref 26.0–34.0)
MCHC: 30.5 g/dL (ref 30.0–36.0)
MCV: 96.6 fL (ref 80.0–100.0)
Platelets: 109 10*3/uL — ABNORMAL LOW (ref 150–400)
RBC: 3.77 MIL/uL — ABNORMAL LOW (ref 3.87–5.11)
RDW: 12.5 % (ref 11.5–15.5)
WBC: 4.1 10*3/uL (ref 4.0–10.5)
nRBC: 0 % (ref 0.0–0.2)

## 2019-06-20 LAB — MAGNESIUM: Magnesium: 2 mg/dL (ref 1.7–2.4)

## 2019-06-20 MED ORDER — MAGNESIUM SULFATE IN D5W 1-5 GM/100ML-% IV SOLN
1.0000 g | Freq: Once | INTRAVENOUS | Status: AC
  Administered 2019-06-20: 1 g via INTRAVENOUS
  Filled 2019-06-20: qty 100

## 2019-06-20 MED ORDER — POTASSIUM CHLORIDE CRYS ER 20 MEQ PO TBCR
40.0000 meq | EXTENDED_RELEASE_TABLET | Freq: Once | ORAL | Status: AC
  Administered 2019-06-20: 40 meq via ORAL
  Filled 2019-06-20: qty 2

## 2019-06-20 NOTE — Progress Notes (Addendum)
Patient unable to urinate with the purewick. Took patient to the bathroom and she voided 312ml of yellow colored urine.

## 2019-06-20 NOTE — Progress Notes (Signed)
PROGRESS NOTE        PATIENT DETAILS Name: Sonya Moran Age: 44 y.o. Sex: female Date of Birth: Mar 19, 1975 Admit Date: 06/15/2019 Admitting Physician Evalee Mutton Kristeen Mans, MD [id number], Myra Rude, MD  Brief Narrative: Patient is a 44 y.o. female with history of developmental delay, schizoaffective disorder, hypothyroidism who was just discharged from this hospital on 5/27 after being treated for AKI, lithium toxicity-was referred to the ED from her group home on 6/1 for evaluation of-she was found to have AKI-subsequently admitted to the hospitalist service.  Significant events: 6/1>> referred to the ED from group home-for decreased oral intake/lethargy-found to have AKI. 6/3>> acute urinary retention requiring Foley catheter insertion-Flomax started.  Significant studies: 6/1: CT head/C-spine>> no acute abnormality noted. 6/1: Chest x-ray>> mild cardiomegaly and pulmonary vascular congestion 6/3: Renal ultrasound>> no hydronephrosis-small right renal cyst.  Antimicrobial therapy: None  Microbiology data: None  Procedures : None  Consults: None  DVT Prophylaxis : Prophylactic Heparin   Subjective:  Patient in bed, appears comfortable, denies any headache, no fever, no chest pain or pressure, no shortness of breath , no abdominal pain. No focal weakness.   Assessment/Plan:  Acute metabolic encephalopathy: Secondary to AKI, dehydration.  Improved with supportive care-suspect she is close to her baseline.   CT imaging head negative for acute abnormalities.  Vitamin B12 within normal limits, RPR nonreactive.  Continue supportive care.  AKI on CKD stage IIIa: Felt to be multifactorial-from urinary retention (more than 700 cc on bladder scan on 6/3), hemodynamic mediated kidney injury in the setting of dehydration. Foley catheter placed 6/3-started Flomax-renal function gradually improving-continue Foley catheter for another day or so-we'll attempt a voiding  trial prior to discharge once renal function improves further.  Hypothyroidism: Continue Synthroid  Intellectual disability/Schizoaffective disorder: Appears stable-continue olanzapine, nortriptyline and Lamictal.  No longer on lithium given recent lithium toxicity.  GERD: Continue PPI  Urinary retention.  Placed on Foley and Flomax, Foley removed after 3 days on 06/20/2019, will monitor postvoid residual.    Debility/deconditioning: Secondary to acute illness-evaluated by PT-recommendations are for home health services.  Diet: Diet Order            Diet regular Room service appropriate? Yes; Fluid consistency: Thin  Diet effective now               Code Status: Full code   Family Communication: Legal guardian Gordy Savers) [phone] left voicemail on 6/3  Disposition Plan: Status is: Inpatient  The patient remains inpatient appropriate given requirement for IV fluids. Dispo: The patient is from: [place]              Anticipated d/c is to: Group Home              Anticipated d/c date is: Likely back to group home on 06/21/2019 with HHPT if no further urinary retention, Foley catheter removed today.   Barriers to Discharge: Persistent AKI-although improving-but not yet at baseline-needs a voiding trial prior to discharge.  Antimicrobial agents: Anti-infectives (From admission, onward)   None       Time spent: 25- minutes-Greater than 50% of this time was spent in counseling, explanation of diagnosis, planning of further management, and coordination of care.  MEDICATIONS: Scheduled Meds: . amitriptyline  100 mg Oral QHS  . Chlorhexidine Gluconate Cloth  6 each Topical Daily  .  fenofibrate  54 mg Oral Daily  . heparin  5,000 Units Subcutaneous Q8H  . lamoTRIgine  200 mg Oral Daily  . levothyroxine  25 mcg Oral QAC breakfast  . linaclotide  145 mcg Oral QAC breakfast  . mirabegron ER  25 mg Oral Daily  . montelukast  10 mg Oral QHS  . OLANZapine  15 mg Oral  QHS  . OLANZapine  15 mg Oral QPC breakfast  . sodium chloride flush  3 mL Intravenous Q12H  . tamsulosin  0.4 mg Oral Daily   Continuous Infusions:  PRN Meds:.acetaminophen **OR** [DISCONTINUED] acetaminophen, fluticasone, guaiFENesin-dextromethorphan, LORazepam, [DISCONTINUED] ondansetron **OR** ondansetron (ZOFRAN) IV, phenol, senna-docusate   PHYSICAL EXAM: Vital signs: Vitals:   06/19/19 1743 06/19/19 2018 06/20/19 0409 06/20/19 0917  BP: 121/70 122/83 118/79 96/66  Pulse: 97 94 94 98  Resp: 20 20 18 18   Temp: 98.8 F (37.1 C) 98.8 F (37.1 C) 98.5 F (36.9 C) 97.9 F (36.6 C)  TempSrc:  Oral Oral   SpO2: 93% 92% 92% 94%  Weight:  67.6 kg    Height:       Filed Weights   06/16/19 0200 06/18/19 2024 06/19/19 2018  Weight: 70.3 kg 68.9 kg 67.6 kg   Body mass index is 31.15 kg/m.   Gen Exam:  Awake Alert, No new F.N deficits, Normal affect [place].AT,PERRAL Supple Neck,No JVD, No cervical lymphadenopathy appriciated.  Symmetrical Chest wall movement, Good air movement bilaterally, CTAB RRR,No Gallops, Rubs or new Murmurs, No Parasternal Heave +ve B.Sounds, Abd Soft, No tenderness, No organomegaly appriciated, No rebound - guarding or rigidity. No Cyanosis, Clubbing or edema, No new Rash or bruise  I have personally reviewed following labs and imaging studies  LABORATORY DATA: CBC: Recent Labs  Lab 06/15/19 2121 06/15/19 2159 06/16/19 0635 06/20/19 0429  WBC 6.6  --  5.6 4.1  NEUTROABS 4.7  --   --   --   HGB 11.1* 11.6* 11.1* 11.1*  HCT 36.6 34.0* 36.4 36.4  MCV 97.6  --  98.4 96.6  PLT 147*  --  135* 109*    Basic Metabolic Panel: Recent Labs  Lab 06/16/19 0635 06/17/19 0351 06/18/19 0608 06/19/19 0555 06/20/19 0429  NA 143 144 143 146* 143  K 3.7 3.6 3.9 3.2* 3.3*  CL 110 112* 110 114* 108  CO2 24 25 23 24 23   GLUCOSE 105* 102* 120* 108* 124*  BUN 15 13 11 10 10   CREATININE 1.46* 1.50* 1.41* 1.38* 1.43*  CALCIUM 9.6 9.6 9.7 9.7 10.0  MG  --    --   --   --  2.0    GFR: Estimated Creatinine Clearance: 40.5 mL/min (A) (by C-G formula based on SCr of 1.43 mg/dL (H)).  Liver Function Tests: Recent Labs  Lab 06/15/19 2121 06/16/19 0635 06/17/19 0351 06/18/19 0608 06/19/19 0555  AST 44* 44* 54* 55* 55*  ALT 34 32 39 44 47*  ALKPHOS 78 74 65 70 70  BILITOT 0.6 0.4 0.7 0.5 0.5  PROT 6.6 6.2* 6.0* 6.1* 6.2*  ALBUMIN 3.4* 3.3* 2.9* 3.1* 3.0*   No results for input(s): LIPASE, AMYLASE in the last 168 hours. Recent Labs  Lab 06/15/19 2120  AMMONIA 25    Coagulation Profile: No results for input(s): INR, PROTIME in the last 168 hours.  Cardiac Enzymes: No results for input(s): CKTOTAL, CKMB, CKMBINDEX, TROPONINI in the last 168 hours.  BNP (last 3 results) No results for input(s): PROBNP in the last 8760 hours.  Lipid Profile: No results for input(s): CHOL, HDL, LDLCALC, TRIG, CHOLHDL, LDLDIRECT in the last 72 hours.  Thyroid Function Tests: No results for input(s): TSH, T4TOTAL, FREET4, T3FREE, THYROIDAB in the last 72 hours.  Anemia Panel: No results for input(s): VITAMINB12, FOLATE, FERRITIN, TIBC, IRON, RETICCTPCT in the last 72 hours.  Urine analysis:    Component Value Date/Time   COLORURINE YELLOW 06/04/2019 1913   APPEARANCEUR CLEAR 06/04/2019 1913   LABSPEC 1.010 06/04/2019 1913   PHURINE 6.0 06/04/2019 1913   GLUCOSEU NEGATIVE 06/04/2019 1913   HGBUR NEGATIVE 06/04/2019 [place] 06/04/2019 1913   KETONESUR NEGATIVE 06/04/2019 1913   PROTEINUR NEGATIVE 06/04/2019 1913   UROBILINOGEN 0.2 09/26/2010 1716   NITRITE NEGATIVE 06/04/2019 1913   LEUKOCYTESUR LARGE (A) 06/04/2019 1913    Sepsis Labs: Lactic Acid, Venous No results found for: LATICACIDVEN  MICROBIOLOGY: Recent Results (from the past 240 hour(s))  SARS Coronavirus 2 by RT PCR (hospital order, performed in [place] hospital lab) Nasopharyngeal Nasopharyngeal Swab     Status: None   Collection Time: 06/15/19   9:41 PM   Specimen: Nasopharyngeal Swab  Result Value Ref Range Status   SARS Coronavirus 2 NEGATIVE NEGATIVE Final    Comment: (NOTE) SARS-CoV-2 target nucleic acids are NOT DETECTED. The SARS-CoV-2 RNA is generally detectable in upper and lower respiratory specimens during the acute phase of infection. The lowest concentration of SARS-CoV-2 viral copies this assay can detect is 250 copies / mL. A negative result does not preclude SARS-CoV-2 infection and should not be used as the sole basis for treatment or other patient management decisions.  A negative result may occur with improper specimen collection / handling, submission of specimen other than nasopharyngeal swab, presence of viral mutation(s) within the areas targeted by this assay, and inadequate number of viral copies (<250 copies / mL). A negative result must be combined with clinical observations, patient history, and epidemiological information. Fact Sheet for Patients:   StrictlyIdeas.no Fact Sheet for Healthcare Providers: BankingDealers.co.za This test is not yet approved or cleared  by the [place] FDA and has been authorized for detection and/or diagnosis of SARS-CoV-2 by FDA under an Emergency Use Authorization (EUA).  This EUA will remain in effect (meaning this test can be used) for the duration of the COVID-19 declaration under Section 564(b)(1) of the Act, 21 U.S.C. section 360bbb-3(b)(1), unless the authorization is terminated or revoked sooner. Performed at [facility], [place] [address] [place], [place] [address]     RADIOLOGY STUDIES/RESULTS: No results found.   LOS: 4 days   Signature  Lala Lund M.D on 06/20/2019 at 10:40 AM   -  To page go to [email or web address]

## 2019-06-21 ENCOUNTER — Inpatient Hospital Stay (HOSPITAL_COMMUNITY): Payer: Medicare Other

## 2019-06-21 LAB — BASIC METABOLIC PANEL
Anion gap: 10 (ref 5–15)
BUN: 12 mg/dL (ref 6–20)
CO2: 23 mmol/L (ref 22–32)
Calcium: 9.9 mg/dL (ref 8.9–10.3)
Chloride: 106 mmol/L (ref 98–111)
Creatinine, Ser: 1.32 mg/dL — ABNORMAL HIGH (ref 0.44–1.00)
GFR calc Af Amer: 56 mL/min — ABNORMAL LOW (ref >60)
GFR calc non Af Amer: 49 mL/min — ABNORMAL LOW (ref >60)
Glucose, Bld: 126 mg/dL — ABNORMAL HIGH (ref 70–99)
Potassium: 3.5 mmol/L (ref 3.5–5.1)
Sodium: 139 mmol/L (ref 135–145)

## 2019-06-21 LAB — BRAIN NATRIURETIC PEPTIDE: B Natriuretic Peptide: 44.9 pg/mL (ref 0.0–100.0)

## 2019-06-21 MED ORDER — ALBUTEROL SULFATE HFA 108 (90 BASE) MCG/ACT IN AERS
2.0000 | INHALATION_SPRAY | RESPIRATORY_TRACT | Status: DC | PRN
  Administered 2019-06-21: 2 via RESPIRATORY_TRACT
  Filled 2019-06-21: qty 6.7

## 2019-06-21 MED ORDER — POTASSIUM CHLORIDE CRYS ER 20 MEQ PO TBCR: 40.0000 meq | EXTENDED_RELEASE_TABLET | Freq: Once | ORAL | Status: DC

## 2019-06-21 NOTE — Progress Notes (Signed)
Physical Therapy Treatment Patient Details Name: Sonya Moran MRN: [id number] DOB: 26-Nov-1975 Today's Date: 06/21/2019    History of Present Illness Pt is a 44 y/o female admitted 06/15/19 from her [place] secondary to lethargy; worked up for acute encephalopathy secondary to AKI, dehydration. PMH including but not limited to developmental delay, CKD, hypothyroidism, PTSD, schizoaffective disorder.    PT Comments    Continuing work on functional mobility and activity tolerance;  Session focused on progressive ambulation, and Sonya Moran participated well; She was able to express the need to go to the restroom, and was continent walking to the bathroom; Seemed to enjoy walking the hallways; Notable progress; I favor dc home to her familiar environment, routine, and caregivers; would like for HHPT follow up; Ended session with Sonya Moran in chair, lunch had been delivered, and she expressed interest in eating her roll; Discussed status with Buckner Malta., LCSW   Follow Up Recommendations  Home health PT;Supervision/Assistance - 24 hour(Consider the [place])     Equipment Recommendations  None recommended by PT    Recommendations for Other Services       Precautions / Restrictions Precautions Precautions: Fall Restrictions Weight Bearing Restrictions: No    Mobility  Bed Mobility                  Transfers Overall transfer level: Needs assistance Equipment used: 1 person hand held assist Transfers: Sit to/from Stand Sit to Stand: Min guard         General transfer comment: Minguard for safety; no physical assist needed  Ambulation/Gait Ambulation/Gait assistance: Min guard(without physical contact) Gait Distance (Feet): 150 Feet Assistive device: Rolling walker (2 wheeled)(and managing in the room without RW) Gait Pattern/deviations: Step-through pattern;Decreased step length - right;Decreased step length - left;Decreased stride length;Wide base of  support     General Gait Details: Walked in the hallway with RW and little difficulty; notable head forward and shoulders rounded posture; no overt signs of fatigue, and she seemed to enjoy walking in the hallway   Stairs             Wheelchair Mobility    Modified Rankin (Stroke Patients Only)       Balance     Sitting balance-Leahy Scale: Fair(approaching Good)       Standing balance-Leahy Scale: Fair                              Cognition Arousal/Alertness: Awake/alert Behavior During Therapy: Flat affect Overall Cognitive Status: History of cognitive impairments - at baseline                                        Exercises      General Comments General comments (skin integrity, edema, etc.): Able to express need to go to the bathroom and continent while walking to the bathroom      Pertinent Vitals/Pain Pain Assessment: No/denies pain    Home Living                      Prior Function            PT Goals (current goals can now be found in the care plan section) Acute Rehab PT Goals Patient Stated Goal: reported a need to go to the bathroom PT Goal Formulation:  With patient Time For Goal Achievement: 06/30/19 Potential to Achieve Goals: Good Progress towards PT goals: Progressing toward goals    Frequency    Min 3X/week      PT Plan Current plan remains appropriate    Co-evaluation              AM-PAC PT "6 Clicks" Mobility   Outcome Measure  Help needed turning from your back to your side while in a flat bed without using bedrails?: None Help needed moving from lying on your back to sitting on the side of a flat bed without using bedrails?: A Little Help needed moving to and from a bed to a chair (including a wheelchair)?: None Help needed standing up from a chair using your arms (e.g., wheelchair or bedside chair)?: None Help needed to walk in hospital room?: None Help needed climbing  3-5 steps with a railing? : A Little 6 Click Score: 22    End of Session Equipment Utilized During Treatment: Gait belt Activity Tolerance: Patient tolerated treatment well Patient left: in chair;with call bell/phone within reach;with chair alarm set Nurse Communication: Mobility status PT Visit Diagnosis: Other abnormalities of gait and mobility (R26.89)     Time: 1125-1150 PT Time Calculation (min) (ACUTE ONLY): 25 min  Charges:  $Gait Training: 23-37 mins                     Roney Marion, [place]  [place] Pager [phone] Office [place] 06/21/2019, 4:00 PM

## 2019-06-21 NOTE — TOC Progression Note (Signed)
Transition of Care [facility]) - Progression Note    Patient Details  Name: Sonya Moran MRN: [id number] Date of Birth: Apr 14, 1975  Transition [facility] Care [facility]) CM/SW Contact  Jacalyn Lefevre Edson Snowball, RN Phone Number: 06/21/2019, 11:25 AM  Clinical Narrative:     Rubin Payor with [facility] aware home health orders have been placed.   Expected Discharge Plan: [place] Barriers to Discharge: Continued Medical Work up  Expected Discharge Plan and Services Expected Discharge Plan: [place] In-house Referral: Clinical Social Work Discharge Planning Services: CM Consult Post Acute Care Choice: [place] arrangements for the past 2 months: Group Home                 DME Arranged: N/A DME Agency: NA       HH Arranged: PT [place] Agency: [facility] Date [place]: 06/16/19 Time [place]: [id number] Representative spoke with at [place]: Cassie   Social Determinants of Health ([place]) Interventions    Readmission Risk Interventions No flowsheet data found.

## 2019-06-21 NOTE — Progress Notes (Signed)
Patient had walked to the bathroom  unassisted with out calling for assistance. CNA was able to assist her back to the chair with patient having a steady gait and CNA on standby.  Patient was transferred from chair to bed with standby assist for bladder scan and then transferred back to chair with standby assist. Gait was steady and able to move around with directions.Follows command.

## 2019-06-21 NOTE — Progress Notes (Addendum)
PROGRESS NOTE        PATIENT DETAILS Name: Sonya Moran Age: 44 y.o. Sex: female Date of Birth: Feb 28, 1975 Admit Date: 06/15/2019 Admitting Physician Evalee Mutton Kristeen Mans, MD WSF:KCLEX, Myra Rude, MD  Brief Narrative: Patient is a 44 y.o. female with history of developmental delay, schizoaffective disorder, hypothyroidism who was just discharged from this hospital on 5/27 after being treated for AKI, lithium toxicity-was referred to the ED from her group home on 6/1 for evaluation of-she was found to have AKI-subsequently admitted to the hospitalist service.  Significant events: 6/1>> referred to the ED from group home-for decreased oral intake/lethargy-found to have AKI. 6/3>> acute urinary retention requiring Foley catheter insertion-Flomax started.  Significant studies: 6/1: CT head/C-spine>> no acute abnormality noted. 6/1: Chest x-ray>> mild cardiomegaly and pulmonary vascular congestion 6/3: Renal ultrasound>> no hydronephrosis-small right renal cyst.  Antimicrobial therapy: None  Microbiology data: None  Procedures : None  Consults: None  DVT Prophylaxis : Prophylactic Heparin   Subjective:  Patient in bed, appears comfortable, denies any headache, no fever, no chest pain or pressure, mild wheezing and  shortness of breath , no abdominal pain. No focal weakness.   Assessment/Plan:  Acute metabolic encephalopathy: Secondary to AKI, dehydration.  Improved with supportive care-suspect she is close to her baseline.   CT imaging head negative for acute abnormalities.  Vitamin B12 within normal limits, RPR nonreactive.  Continue supportive care.  AKI on CKD stage IIIa baseline creatinine around 1.6: Felt to be multifactorial-from urinary retention (more than 700 cc on bladder scan on 6/3), hemodynamic mediated kidney injury in the setting of dehydration. Foley catheter placed 6/3-started Flomax-renal function gradually improved, Foley catheter removed  06/21/2019, stable post void residuals.  Continue Flomax for now.  Shortness of breath and wheezing - sounds like reactive airway versus mild fluid overload, chest x-ray stable, trial of nebulizer treatment, low-dose Lasix, monitor.  If stable discharge tomorrow.    Hypothyroidism: Continue Synthroid  Intellectual disability/Schizoaffective disorder: Appears stable-continue olanzapine, nortriptyline and Lamictal.  No longer on lithium given recent lithium toxicity.  GERD: Continue PPI  Urinary retention.  Placed on Foley and Flomax, Foley removed after 3 days on 06/20/2019, will monitor postvoid residual.    Debility/deconditioning: Secondary to acute illness-evaluated by PT-recommendations are for home health services.  Diet: Diet Order            Diet regular Room service appropriate? Yes; Fluid consistency: Thin  Diet effective now               Code Status: Full code   Family Communication: Legal guardian Gordy Savers) [phone] discussed with on 06/21/2019  Disposition Plan: Status is: Inpatient  The patient remains inpatient appropriate given requirement for IV fluids. Dispo: The patient is from: [place]              Anticipated d/c is to: Group Home              Anticipated d/c date is: Likely back to group home on 06/22/2019 with HHPT if no wheezing and SOB.   Barriers to Discharge: Persistent AKI-although improving-but not yet at baseline-needs a voiding trial prior to discharge.  Antimicrobial agents: Anti-infectives (From admission, onward)   None       Time spent:  25- minutes-Greater than 50% of this time was spent in counseling, explanation of diagnosis, planning  of further management, and coordination of care.  MEDICATIONS:  Scheduled Meds: . amitriptyline  100 mg Oral QHS  . Chlorhexidine Gluconate Cloth  6 each Topical Daily  . fenofibrate  54 mg Oral Daily  . heparin  5,000 Units Subcutaneous Q8H  . lamoTRIgine  200 mg Oral Daily  .  levothyroxine  25 mcg Oral QAC breakfast  . linaclotide  145 mcg Oral QAC breakfast  . mirabegron ER  25 mg Oral Daily  . montelukast  10 mg Oral QHS  . OLANZapine  15 mg Oral QHS  . OLANZapine  15 mg Oral QPC breakfast  . sodium chloride flush  3 mL Intravenous Q12H  . tamsulosin  0.4 mg Oral Daily   Continuous Infusions:  PRN Meds:.acetaminophen **OR** [DISCONTINUED] acetaminophen, albuterol, fluticasone, guaiFENesin-dextromethorphan, LORazepam, [DISCONTINUED] ondansetron **OR** ondansetron (ZOFRAN) IV, phenol, senna-docusate   PHYSICAL EXAM: Vital signs: Vitals:   06/20/19 1650 06/20/19 2036 06/21/19 0510 06/21/19 0951  BP: 103/83 (!) 107/91 140/82 115/88  Pulse: 95 89 78 91  Resp: 18 16 19 18   Temp: 98.1 F (36.7 C) 99.6 F (37.6 C) 97.9 F (36.6 C) 98.1 F (36.7 C)  TempSrc: Oral Oral Oral Oral  SpO2: 94% 94% 93% 94%  Weight:      Height:       Filed Weights   06/16/19 0200 06/18/19 2024 06/19/19 2018  Weight: 70.3 kg 68.9 kg 67.6 kg   Body mass index is 31.15 kg/m.    Gen Exam:  Awake Alert, No new F.N deficits, Normal affect [place].AT,PERRAL Supple Neck,No JVD, No cervical lymphadenopathy appriciated.  Symmetrical Chest wall movement, Good air movement bilaterally, mild wheezing RRR,No Gallops, Rubs or new Murmurs, No Parasternal Heave +ve B.Sounds, Abd Soft, No tenderness, No organomegaly appriciated, No rebound - guarding or rigidity. No Cyanosis, Clubbing or edema, No new Rash or bruise   I have personally reviewed following labs and imaging studies  LABORATORY DATA: CBC: Recent Labs  Lab 06/15/19 2121 06/15/19 2159 06/16/19 0635 06/20/19 0429  WBC 6.6  --  5.6 4.1  NEUTROABS 4.7  --   --   --   HGB 11.1* 11.6* 11.1* 11.1*  HCT 36.6 34.0* 36.4 36.4  MCV 97.6  --  98.4 96.6  PLT 147*  --  135* 109*    Basic Metabolic Panel: Recent Labs  Lab 06/16/19 0635 06/17/19 0351 06/18/19 0608 06/19/19 0555 06/20/19 0429  NA 143 144 143 146* 143    K 3.7 3.6 3.9 3.2* 3.3*  CL 110 112* 110 114* 108  CO2 24 25 23 24 23   GLUCOSE 105* 102* 120* 108* 124*  BUN 15 13 11 10 10   CREATININE 1.46* 1.50* 1.41* 1.38* 1.43*  CALCIUM 9.6 9.6 9.7 9.7 10.0  MG  --   --   --   --  2.0    GFR: Estimated Creatinine Clearance: 40.5 mL/min (A) (by C-G formula based on SCr of 1.43 mg/dL (H)).  Liver Function Tests: Recent Labs  Lab 06/15/19 2121 06/16/19 0635 06/17/19 0351 06/18/19 0608 06/19/19 0555  AST 44* 44* 54* 55* 55*  ALT 34 32 39 44 47*  ALKPHOS 78 74 65 70 70  BILITOT 0.6 0.4 0.7 0.5 0.5  PROT 6.6 6.2* 6.0* 6.1* 6.2*  ALBUMIN 3.4* 3.3* 2.9* 3.1* 3.0*   No results for input(s): LIPASE, AMYLASE in the last 168 hours. Recent Labs  Lab 06/15/19 2120  AMMONIA 25    Coagulation Profile: No results for input(s): INR, PROTIME  in the last 168 hours.  Cardiac Enzymes: No results for input(s): CKTOTAL, CKMB, CKMBINDEX, TROPONINI in the last 168 hours.  BNP (last 3 results) No results for input(s): PROBNP in the last 8760 hours.  Lipid Profile: No results for input(s): CHOL, HDL, LDLCALC, TRIG, CHOLHDL, LDLDIRECT in the last 72 hours.  Thyroid Function Tests: No results for input(s): TSH, T4TOTAL, FREET4, T3FREE, THYROIDAB in the last 72 hours.  Anemia Panel: No results for input(s): VITAMINB12, FOLATE, FERRITIN, TIBC, IRON, RETICCTPCT in the last 72 hours.  Urine analysis:    Component Value Date/Time   COLORURINE YELLOW 06/04/2019 1913   APPEARANCEUR CLEAR 06/04/2019 1913   LABSPEC 1.010 06/04/2019 1913   PHURINE 6.0 06/04/2019 1913   GLUCOSEU NEGATIVE 06/04/2019 1913   HGBUR NEGATIVE 06/04/2019 [place] 06/04/2019 1913   KETONESUR NEGATIVE 06/04/2019 1913   PROTEINUR NEGATIVE 06/04/2019 1913   UROBILINOGEN 0.2 09/26/2010 1716   NITRITE NEGATIVE 06/04/2019 1913   LEUKOCYTESUR LARGE (A) 06/04/2019 1913    Sepsis Labs: Lactic Acid, Venous No results found for:  LATICACIDVEN  MICROBIOLOGY: Recent Results (from the past 240 hour(s))  SARS Coronavirus 2 by RT PCR (hospital order, performed in [place] hospital lab) Nasopharyngeal Nasopharyngeal Swab     Status: None   Collection Time: 06/15/19  9:41 PM   Specimen: Nasopharyngeal Swab  Result Value Ref Range Status   SARS Coronavirus 2 NEGATIVE NEGATIVE Final    Comment: (NOTE) SARS-CoV-2 target nucleic acids are NOT DETECTED. The SARS-CoV-2 RNA is generally detectable in upper and lower respiratory specimens during the acute phase of infection. The lowest concentration of SARS-CoV-2 viral copies this assay can detect is 250 copies / mL. A negative result does not preclude SARS-CoV-2 infection and should not be used as the sole basis for treatment or other patient management decisions.  A negative result may occur with improper specimen collection / handling, submission of specimen other than nasopharyngeal swab, presence of viral mutation(s) within the areas targeted by this assay, and inadequate number of viral copies (<250 copies / mL). A negative result must be combined with clinical observations, patient history, and epidemiological information. Fact Sheet for Patients:   StrictlyIdeas.no Fact Sheet for Healthcare Providers: BankingDealers.co.za This test is not yet approved or cleared  by the [place] FDA and has been authorized for detection and/or diagnosis of SARS-CoV-2 by FDA under an Emergency Use Authorization (EUA).  This EUA will remain in effect (meaning this test can be used) for the duration of the COVID-19 declaration under Section 564(b)(1) of the Act, 21 U.S.C. section 360bbb-3(b)(1), unless the authorization is terminated or revoked sooner. Performed at [facility], [place] [address], [place], [place] [address]     RADIOLOGY STUDIES/RESULTS: DG Chest Port 1 View  Result Date: 06/21/2019 CLINICAL DATA:  Sudden  shortness of breath, acute encephalopathy EXAM: PORTABLE CHEST 1 VIEW COMPARISON:  Portable exam 0915 hours compared to 06/15/2019 FINDINGS: Borderline enlargement of cardiac silhouette. Mediastinal contours and pulmonary vascularity normal. Decreased lung volumes with bibasilar atelectasis and crowding of perihilar markings. No definite infiltrate, pleural effusion or pneumothorax. Mild elevation of LEFT diaphragm. Bones demineralized. IMPRESSION: Decreased lung volumes with bibasilar atelectasis. Electronically Signed   By: Lavonia Dana M.D.   On: 06/21/2019 09:48     LOS: 5 days   Signature  Lala Lund M.D on 06/21/2019 at 10:38 AM   -  To page go to [email or web address]

## 2019-06-21 NOTE — Plan of Care (Signed)
  Problem: Elimination: Goal: Will not experience complications related to urinary retention Outcome: Progressing   Problem: Nutrition: Goal: Adequate nutrition will be maintained Outcome: Progressing

## 2019-06-22 MED ORDER — ALBUTEROL SULFATE HFA 108 (90 BASE) MCG/ACT IN AERS: 2.0000 | INHALATION_SPRAY | Freq: Four times a day (QID) | RESPIRATORY_TRACT | 0 refills | Status: DC | PRN

## 2019-06-22 MED ORDER — POTASSIUM CHLORIDE CRYS ER 20 MEQ PO TBCR
40.0000 meq | EXTENDED_RELEASE_TABLET | Freq: Once | ORAL | Status: AC
  Administered 2019-06-22: 40 meq via ORAL
  Filled 2019-06-22: qty 2

## 2019-06-22 NOTE — TOC Transition Note (Signed)
Transition of Care [facility]) - CM/SW Discharge Note   Patient Details  Name: Sonya Moran MRN: [id number] Date of Birth: 06/06/1975  [facility] CM/SW Contact:  Sable Feil, LCSW Phone Number: 06/22/2019, 10:43 AM   Clinical Narrative:  Patient medically stable for discharge back to [facility]. Legal Judd Gaudier contacted [phone]) and HIPPA compliant message left. Contacted group home director, [name] [phone]) and informed her of patient's readiness for discharge. [name] informed CSW that she will pick patient up around 6 pm. Patient's nurse updated.        Final next level of care: Group Home([facility]) Barriers to Discharge: Barriers Resolved   Patient Goals and CMS Choice Patient states their goals for this hospitalization and ongoing recovery are:: Patient wants to return to the group home CMS Medicare.gov Compare Post Acute Care list provided to:: Other (Comment Required)(Not needed as patient from group home and returning there at d/c) Choice offered to / list presented to : NA  Discharge Placement              Patient chooses bed at: ([facility]) Patient to be transferred to facility by: Facility administrator, [name] Name of family member notified: Gordy Savers - Legal Guardian [phone]). Contacted and HIPPA complaint message left regarding patient's discharge Patient and family notified of of transfer: 06/22/19  Discharge Plan and Services In-house Referral: Clinical Social Work Discharge Planning Services: CM Consult Post Acute Care Choice: Home Health          DME Arranged: N/A DME Agency: NA       HH Arranged: PT HH Agency: [facility] Date [place]: 06/16/19 Time [place]: 7588 Representative spoke with at [place]: Cassie  Social Determinants of Health ([place]) Interventions  No SDOH interventions requested or needed at  discharge   Readmission Risk Interventions No flowsheet data found.

## 2019-06-22 NOTE — Discharge Instructions (Signed)
Follow with Primary MD Lucianne Lei, MD in 7 days   Get CBC, CMP checked next visit within 1 week by Primary MD   Activity: As tolerated with Full fall precautions use walker/cane & assistance as needed  Disposition Home    Diet: Heart Healthy, all meals sitting up in chair.  Special Instructions: If you have smoked or chewed Tobacco  in the last 2 yrs please stop smoking, stop any regular Alcohol  and or any Recreational drug use.  On your next visit with your primary care physician please Get Medicines reviewed and adjusted.  Please request your Prim.MD to go over all Hospital Tests and Procedure/Radiological results at the follow up, please get all Hospital records sent to your Prim MD by signing hospital release before you go home.  If you experience worsening of your admission symptoms, develop shortness of breath, life threatening emergency, suicidal or homicidal thoughts you must seek medical attention immediately by calling 911 or calling your MD immediately  if symptoms less severe.  You Must read complete instructions/literature along with all the possible adverse reactions/side effects for all the Medicines you take and that have been prescribed to you. Take any new Medicines after you have completely understood and accpet all the possible adverse reactions/side effects.

## 2019-06-22 NOTE — Discharge Summary (Signed)
Sonya Moran QVZ:[id number] DOB: 1975-02-19 DOA: 06/15/2019  PCP: Lucianne Lei, MD  Admit date: 06/15/2019  Discharge date: 06/22/2019  Admitted From: Group Home   Disposition:  Group Home   Recommendations for Outpatient Follow-up:   Follow up with PCP in 1-2 weeks  PCP Please obtain BMP/CBC, 2 view CXR in 1week,  (see Discharge instructions)   PCP Please follow up on the following pending results: Monitor her psychiatric medications and avoid excessive sedation.   Home Health: PT, RN, SLP Equipment/Devices: walker  Consultations: None  Discharge Condition: Stable    CODE STATUS: Full    Diet Recommendation: Heart Healthy     Chief Complaint  Patient presents with  . Weakness     Brief history of present illness from the day of admission and additional interim summary    Patient is a 44 y.o. female with history of developmental delay, schizoaffective disorder, hypothyroidism who was just discharged from this hospital on 5/27 after being treated for AKI, lithium toxicity-was referred to the ED from her group home on 6/1 for evaluation of-she was found to have AKI-subsequently admitted to the hospitalist service.  Significant events: 6/1>> referred to the ED from group home-for decreased oral intake/lethargy-found to have AKI. 6/3>> acute urinary retention requiring Foley catheter insertion-Flomax started.  Significant studies: 6/1: CT head/C-spine>> no acute abnormality noted. 6/1: Chest x-ray>> mild cardiomegaly and pulmonary vascular congestion 6/3: Renal ultrasound>> no hydronephrosis-small right renal cyst.                                                                 Hospital Course    Acute metabolic encephalopathy: Secondary to AKI, dehydration.  Improved with supportive care-suspect she is  close to her baseline.   CT imaging head negative for acute abnormalities.  Vitamin B12 within normal limits, RPR nonreactive.    She is much improved and will be discharged back to group home, will order home PT, RN and speech for follow-up, she does have some baseline speech issues and she will benefit from speech follow-up as well.  Will be provided a walker as well.  AKI on CKD stage IIIa baseline creatinine around 1.6: Felt to be multifactorial-from urinary retention (more than 700 cc on bladder scan on 6/3), hemodynamic mediated kidney injury in the setting of dehydration. Foley catheter placed 6/3-started Flomax-renal function gradually improved, Foley catheter removed 06/21/2019, stable post void residuals.  Flomax has been stopped.  Shortness of breath and wheezing - was mild bronchoconstriction which has resolved after albuterol treatments, will give albuterol inhaler upon discharge.  Hypothyroidism: Continue Synthroid  Intellectual disability/Schizoaffective disorder: Appears stable-continue olanzapine, nortriptyline and Lamictal.  No longer on lithium given recent lithium toxicity.  May benefit from psych follow-up within a week to get her medications adjusted.  GERD: Continue  PPI  Urinary retention.  Placed on Foley and Flomax, Foley removed after 3 days on 06/20/2019, Flomax stopped, post void residuals stable.      Debility/deconditioning: Secondary to acute illness-evaluated by PT-recommendations are for home health services.   Discharge diagnosis     Principal Problem:   Acute encephalopathy Active Problems:   Acute renal failure superimposed on stage 3 chronic kidney disease (HCC)   Schizoaffective disorder, bipolar type (HCC)   Hypothyroidism   AKI (acute kidney injury) ([place])    Discharge instructions    Discharge Instructions    Diet - low sodium heart healthy   Complete by: As directed    Discharge instructions   Complete by: As directed    Follow with  Primary MD Lucianne Lei, MD in 7 days   Get CBC, CMP checked next visit within 1 week by Primary MD   Activity: As tolerated with Full fall precautions use walker/cane & assistance as needed  Disposition Home    Diet: Heart Healthy, all meals sitting up in chair.  Special Instructions: If you have smoked or chewed Tobacco  in the last 2 yrs please stop smoking, stop any regular Alcohol  and or any Recreational drug use.  On your next visit with your primary care physician please Get Medicines reviewed and adjusted.  Please request your Prim.MD to go over all Hospital Tests and Procedure/Radiological results at the follow up, please get all Hospital records sent to your Prim MD by signing hospital release before you go home.  If you experience worsening of your admission symptoms, develop shortness of breath, life threatening emergency, suicidal or homicidal thoughts you must seek medical attention immediately by calling 911 or calling your MD immediately  if symptoms less severe.  You Must read complete instructions/literature along with all the possible adverse reactions/side effects for all the Medicines you take and that have been prescribed to you. Take any new Medicines after you have completely understood and accpet all the possible adverse reactions/side effects.   Increase activity slowly   Complete by: As directed       Discharge Medications   Allergies as of 06/22/2019      Reactions   Grifulvin V [griseofulvin] Anaphylaxis, Other (See Comments)   Arm swelling      Medication List    STOP taking these medications   celecoxib 100 MG capsule Commonly known as: CELEBREX   LORazepam 1 MG tablet Commonly known as: ATIVAN     TAKE these medications   albuterol 108 (90 Base) MCG/ACT inhaler Commonly known as: VENTOLIN HFA Inhale 2 puffs into the lungs every 6 (six) hours as needed for wheezing or shortness of breath.   amitriptyline 100 MG tablet Commonly known as:  ELAVIL Take 100 mg by mouth at bedtime.   Azelastine HCl 0.15 % Soln 2 SPRAYS EACH NOSTRIL TWICE DAILY.   cetirizine 10 MG tablet Commonly known as: ZYRTEC TAKE (1) TABLET BY MOUTH ONCE DAILY. What changed: See the new instructions.   Dexilant 60 MG capsule Generic drug: dexlansoprazole Take 60 mg by mouth daily.   fenofibrate 145 MG tablet Commonly known as: TRICOR Take 145 mg by mouth at bedtime.   fluticasone 50 MCG/ACT nasal spray Commonly known as: FLONASE 2 SPRAYS INTO BOTH NOSTRILS ONCE A DAY AS NEEDED FOR STUFFY NOSE What changed:   how much to take  how to take this  when to take this  reasons to take this   lamoTRIgine 200 MG tablet  Commonly known as: LAMICTAL Take 200 mg by mouth daily.   levothyroxine 25 MCG tablet Commonly known as: SYNTHROID Take 25 mcg by mouth daily.   Linzess 145 MCG Caps capsule Generic drug: linaclotide Take 145 mcg by mouth daily.   montelukast 10 MG tablet Commonly known as: SINGULAIR TAKE ONE TABLET BY MOUTH EACH EVENING TO PREVENT COUGH OR WHEEZE What changed:   how much to take  how to take this  when to take this  additional instructions   Myrbetriq 25 MG Tb24 tablet Generic drug: mirabegron ER Take 25 mg by mouth daily.   OLANZapine 15 MG tablet Commonly known as: ZYPREXA Take 1 tablet (15 mg total) by mouth daily after breakfast. What changed: when to take this   Olopatadine HCl 0.2 % Soln Place 1 drop into both eyes daily as needed (for itching).   phenol 1.4 % Liqd Commonly known as: CHLORASEPTIC Use as directed 1 spray in the mouth or throat as needed for throat irritation / pain.   propranolol 40 MG tablet Commonly known as: INDERAL Take 40 mg by mouth 2 (two) times daily.   SALINE MIST 0.65 % nasal spray Generic drug: sodium chloride 2 SPRAYS EACH NOSTRIL TWICE DAILY. What changed: See the new instructions.            Durable Medical Equipment  (From admission, onward)          Start     Ordered   06/22/19 0917  DME Gilford Rile  Once    Question Answer Comment  Walker: With 5 Inch Wheels   Patient needs a walker to treat with the following condition Weakness      06/22/19 0917          Follow-up Information    [facility] Follow up.   Specialty: [place] Why: the office will call to schedule visits Contact information: [address] [address] [phone]        Lucianne Lei, MD. Schedule an appointment as soon as possible for a visit in 1 week(s).   Specialty: Family Medicine Why: Follow with your psychiatrist within a week and get your psych medications readjusted. Contact information: [address] [place] [address] [phone]           Major procedures and Radiology Reports - PLEASE review detailed and final reports thoroughly  -        CT Head Wo Contrast  Result Date: 06/15/2019 CLINICAL DATA:  Altered mental status and recent falls EXAM: CT HEAD WITHOUT CONTRAST CT CERVICAL SPINE WITHOUT CONTRAST TECHNIQUE: Multidetector CT imaging of the head and cervical spine was performed following the standard protocol without intravenous contrast. Multiplanar CT image reconstructions of the cervical spine were also generated. COMPARISON:  06/04/2019 FINDINGS: CT HEAD FINDINGS Brain: No evidence of acute infarction, hemorrhage, hydrocephalus, extra-axial collection or mass lesion/mass effect. Vascular: No hyperdense vessel or unexpected calcification. Skull: Normal. Negative for fracture or focal lesion. Sinuses/Orbits: No acute finding. Other: None. CT CERVICAL SPINE FINDINGS Alignment: Within normal limits. Skull base and vertebrae: 7 cervical segments are well visualized. Vertebral body height is well maintained. Osteophytic changes are noted most prominent at C5-6. Mild facet hypertrophic changes are noted. No acute fracture or acute facet abnormality is noted. Soft tissues and spinal canal: Surrounding  soft tissue structures are within normal limits. Upper chest: Visualized lung apices are unremarkable. Other: None IMPRESSION: CT of the head: No acute intracranial abnormality noted. CT  of the cervical spine: No acute abnormality is noted. Mild degenerative changes are seen primarily at C5-6. Electronically Signed   By: Inez Catalina M.D.   On: 06/15/2019 22:59   CT Head Wo Contrast  Result Date: 06/04/2019 CLINICAL DATA:  Altered mental status. EXAM: CT HEAD WITHOUT CONTRAST TECHNIQUE: Contiguous axial images were obtained from the base of the skull through the vertex without intravenous contrast. COMPARISON:  04/14/2008 FINDINGS: Brain: No evidence of acute infarction, hemorrhage, hydrocephalus, extra-axial collection or mass lesion/mass effect. Vascular: No hyperdense vessel or unexpected calcification. Skull: Normal. Negative for fracture or focal lesion. Sinuses/Orbits: No acute finding. Other: None. IMPRESSION: No acute intracranial pathology. Electronically Signed   By: Constance Holster M.D.   On: 06/04/2019 19:17   CT Cervical Spine Wo Contrast  Result Date: 06/15/2019 CLINICAL DATA:  Altered mental status and recent falls EXAM: CT HEAD WITHOUT CONTRAST CT CERVICAL SPINE WITHOUT CONTRAST TECHNIQUE: Multidetector CT imaging of the head and cervical spine was performed following the standard protocol without intravenous contrast. Multiplanar CT image reconstructions of the cervical spine were also generated. COMPARISON:  06/04/2019 FINDINGS: CT HEAD FINDINGS Brain: No evidence of acute infarction, hemorrhage, hydrocephalus, extra-axial collection or mass lesion/mass effect. Vascular: No hyperdense vessel or unexpected calcification. Skull: Normal. Negative for fracture or focal lesion. Sinuses/Orbits: No acute finding. Other: None. CT CERVICAL SPINE FINDINGS Alignment: Within normal limits. Skull base and vertebrae: 7 cervical segments are well visualized. Vertebral body height is well maintained.  Osteophytic changes are noted most prominent at C5-6. Mild facet hypertrophic changes are noted. No acute fracture or acute facet abnormality is noted. Soft tissues and spinal canal: Surrounding soft tissue structures are within normal limits. Upper chest: Visualized lung apices are unremarkable. Other: None IMPRESSION: CT of the head: No acute intracranial abnormality noted. CT of the cervical spine: No acute abnormality is noted. Mild degenerative changes are seen primarily at C5-6. Electronically Signed   By: Inez Catalina M.D.   On: 06/15/2019 22:59   US RENAL  Result Date: 06/17/2019 CLINICAL DATA:  Acute renal injury EXAM: RENAL / URINARY TRACT ULTRASOUND COMPLETE COMPARISON:  None. FINDINGS: Right Kidney: Renal measurements: 10.6 x 4.4 x 4.2 cm. = volume: 102 mL. No mass lesion or hydronephrosis is noted. Small 12 mm cyst is noted in the lower pole. Left Kidney: Renal measurements: 10.0 x 4.6 x 3.6 cm = volume: 88 mL. Echogenicity within normal limits. No mass or hydronephrosis visualized. Bladder: Decompressed by Foley catheter. Other: None. IMPRESSION: Small right renal cyst.  No obstructive changes are noted. Electronically Signed   By: Inez Catalina M.D.   On: 06/17/2019 21:43   DG Chest Port 1 View  Result Date: 06/21/2019 CLINICAL DATA:  Sudden shortness of breath, acute encephalopathy EXAM: PORTABLE CHEST 1 VIEW COMPARISON:  Portable exam 0915 hours compared to 06/15/2019 FINDINGS: Borderline enlargement of cardiac silhouette. Mediastinal contours and pulmonary vascularity normal. Decreased lung volumes with bibasilar atelectasis and crowding of perihilar markings. No definite infiltrate, pleural effusion or pneumothorax. Mild elevation of LEFT diaphragm. Bones demineralized. IMPRESSION: Decreased lung volumes with bibasilar atelectasis. Electronically Signed   By: Lavonia Dana M.D.   On: 06/21/2019 09:48   DG Chest Port 1 View  Result Date: 06/15/2019 CLINICAL DATA:  Change in mental status  EXAM: PORTABLE CHEST 1 VIEW COMPARISON:  Jun 04, 2019 FINDINGS: The heart size and mediastinal contours are mildly enlarged as on prior exam. Bibasilar subsegmental atelectasis is noted. There is elevation of the left hemidiaphragm.  There is mild prominence of the central pulmonary vasculature. No acute osseous abnormality. The visualized skeletal structures are unremarkable. IMPRESSION: Mild cardiomegaly and pulmonary vascular congestion. Electronically Signed   By: Prudencio Pair M.D.   On: 06/15/2019 22:03   DG Chest Portable 1 View  Result Date: 06/04/2019 CLINICAL DATA:  Increasing weakness EXAM: PORTABLE CHEST 1 VIEW COMPARISON:  07/08/2018 FINDINGS: Cardiac shadow is stable. The lungs are well aerated bilaterally. Bibasilar atelectatic changes are noted. No sizable effusion is seen. No acute bony abnormality is noted. IMPRESSION: Bibasilar atelectasis. Electronically Signed   By: Inez Catalina M.D.   On: 06/04/2019 17:21    Micro Results     Recent Results (from the past 240 hour(s))  SARS Coronavirus 2 by RT PCR (hospital order, performed in [facility] hospital lab) Nasopharyngeal Nasopharyngeal Swab     Status: None   Collection Time: 06/15/19  9:41 PM   Specimen: Nasopharyngeal Swab  Result Value Ref Range Status   SARS Coronavirus 2 NEGATIVE NEGATIVE Final    Comment: (NOTE) SARS-CoV-2 target nucleic acids are NOT DETECTED. The SARS-CoV-2 RNA is generally detectable in upper and lower respiratory specimens during the acute phase of infection. The lowest concentration of SARS-CoV-2 viral copies this assay can detect is 250 copies / mL. A negative result does not preclude SARS-CoV-2 infection and should not be used as the sole basis for treatment or other patient management decisions.  A negative result may occur with improper specimen collection / handling, submission of specimen other than nasopharyngeal swab, presence of viral mutation(s) within the areas targeted by this assay,  and inadequate number of viral copies (<250 copies / mL). A negative result must be combined with clinical observations, patient history, and epidemiological information. Fact Sheet for Patients:   StrictlyIdeas.no Fact Sheet for Healthcare Providers: BankingDealers.co.za This test is not yet approved or cleared  by the [place] FDA and has been authorized for detection and/or diagnosis of SARS-CoV-2 by FDA under an Emergency Use Authorization (EUA).  This EUA will remain in effect (meaning this test can be used) for the duration of the COVID-19 declaration under Section 564(b)(1) of the Act, 21 U.S.C. section 360bbb-3(b)(1), unless the authorization is terminated or revoked sooner. Performed at [facility], [place] [address], [place], [place] [address]     Today   Subjective    Karmel Patricelli today has no headache,no chest abdominal pain,no new weakness tingling or numbness, feels much better wants to go her group home today.     Objective   Blood pressure 115/82, pulse 96, temperature 97.7 F (36.5 C), temperature source Oral, resp. rate 16, height 4\' 10"  (1.473 m), weight 67.4 kg, SpO2 95 %.   Intake/Output Summary (Last 24 hours) at 06/22/2019 0922 Last data filed at 06/22/2019 0908 Gross per 24 hour  Intake 1300 ml  Output 1700 ml  Net -400 ml    Exam  Awake Alert, No new F.N deficits,   [place].AT,PERRAL Supple Neck,No JVD, No cervical lymphadenopathy appriciated.  Symmetrical Chest wall movement, Good air movement bilaterally, CTAB RRR,No Gallops,Rubs or new Murmurs, No Parasternal Heave +ve B.Sounds, Abd Soft, Non tender, No organomegaly appriciated, No rebound -guarding or rigidity. No Cyanosis, Clubbing or edema, No new Rash or bruise   Data Review   CBC w Diff:  Lab Results  Component Value Date   WBC 4.1 06/20/2019   HGB 11.1 (L) 06/20/2019   HGB 9.6 (L) 03/20/2015   HCT 36.4 06/20/2019  HCT 30.2 (L)  03/20/2015   PLT 109 (L) 06/20/2019   PLT 105 (L) 03/20/2015   LYMPHOPCT 20 06/15/2019   LYMPHOPCT 41.8 03/20/2015   MONOPCT 7 06/15/2019   MONOPCT 9.9 03/20/2015   EOSPCT 1 06/15/2019   EOSPCT 2.3 03/20/2015   BASOPCT 1 06/15/2019   BASOPCT 0.3 03/20/2015    CMP:  Lab Results  Component Value Date   NA 139 06/21/2019   NA 137 10/29/2013   K 3.5 06/21/2019   K 3.5 10/29/2013   CL 106 06/21/2019   CL 108 10/29/2013   CO2 23 06/21/2019   CO2 24 10/29/2013   BUN 12 06/21/2019   BUN 8 10/29/2013   CREATININE 1.32 (H) 06/21/2019   CREATININE 0.9 10/29/2013   PROT 6.2 (L) 06/19/2019   PROT 6.6 10/29/2013   ALBUMIN 3.0 (L) 06/19/2019   ALBUMIN 3.2 (L) 10/29/2013   BILITOT 0.5 06/19/2019   BILITOT 0.80 10/29/2013   ALKPHOS 70 06/19/2019   ALKPHOS 37 10/29/2013   AST 55 (H) 06/19/2019   AST 55 (H) 10/29/2013   ALT 47 (H) 06/19/2019   ALT 34 10/29/2013  .   Total Time in preparing paper work, data evaluation and todays exam - 31 minutes  Lala Lund M.D on 06/22/2019 at [facility]  [phone]

## 2019-06-23 DIAGNOSIS — F329 Major depressive disorder, single episode, unspecified: Secondary | ICD-10-CM | POA: Diagnosis not present

## 2019-06-23 DIAGNOSIS — F259 Schizoaffective disorder, unspecified: Secondary | ICD-10-CM | POA: Diagnosis not present

## 2019-06-23 DIAGNOSIS — F4310 Post-traumatic stress disorder, unspecified: Secondary | ICD-10-CM | POA: Diagnosis not present

## 2019-06-23 DIAGNOSIS — R41841 Cognitive communication deficit: Secondary | ICD-10-CM | POA: Diagnosis not present

## 2019-06-23 DIAGNOSIS — F79 Unspecified intellectual disabilities: Secondary | ICD-10-CM | POA: Diagnosis not present

## 2019-06-23 DIAGNOSIS — N1831 Chronic kidney disease, stage 3a: Secondary | ICD-10-CM | POA: Diagnosis not present

## 2019-06-23 DIAGNOSIS — G9340 Encephalopathy, unspecified: Secondary | ICD-10-CM | POA: Diagnosis not present

## 2019-06-23 DIAGNOSIS — R2681 Unsteadiness on feet: Secondary | ICD-10-CM | POA: Diagnosis not present

## 2019-06-23 DIAGNOSIS — R479 Unspecified speech disturbances: Secondary | ICD-10-CM | POA: Diagnosis not present

## 2019-06-27 DIAGNOSIS — N1831 Chronic kidney disease, stage 3a: Secondary | ICD-10-CM | POA: Diagnosis not present

## 2019-06-27 DIAGNOSIS — F329 Major depressive disorder, single episode, unspecified: Secondary | ICD-10-CM | POA: Diagnosis not present

## 2019-06-27 DIAGNOSIS — F259 Schizoaffective disorder, unspecified: Secondary | ICD-10-CM | POA: Diagnosis not present

## 2019-06-27 DIAGNOSIS — F4310 Post-traumatic stress disorder, unspecified: Secondary | ICD-10-CM | POA: Diagnosis not present

## 2019-06-27 DIAGNOSIS — F79 Unspecified intellectual disabilities: Secondary | ICD-10-CM | POA: Diagnosis not present

## 2019-06-27 DIAGNOSIS — G9340 Encephalopathy, unspecified: Secondary | ICD-10-CM | POA: Diagnosis not present

## 2019-06-29 DIAGNOSIS — F251 Schizoaffective disorder, depressive type: Secondary | ICD-10-CM | POA: Diagnosis not present

## 2019-06-29 DIAGNOSIS — F70 Mild intellectual disabilities: Secondary | ICD-10-CM | POA: Diagnosis not present

## 2019-06-29 DIAGNOSIS — F609 Personality disorder, unspecified: Secondary | ICD-10-CM | POA: Diagnosis not present

## 2019-06-30 DIAGNOSIS — F329 Major depressive disorder, single episode, unspecified: Secondary | ICD-10-CM | POA: Diagnosis not present

## 2019-06-30 DIAGNOSIS — N1831 Chronic kidney disease, stage 3a: Secondary | ICD-10-CM | POA: Diagnosis not present

## 2019-06-30 DIAGNOSIS — F4310 Post-traumatic stress disorder, unspecified: Secondary | ICD-10-CM | POA: Diagnosis not present

## 2019-06-30 DIAGNOSIS — F79 Unspecified intellectual disabilities: Secondary | ICD-10-CM | POA: Diagnosis not present

## 2019-06-30 DIAGNOSIS — F259 Schizoaffective disorder, unspecified: Secondary | ICD-10-CM | POA: Diagnosis not present

## 2019-06-30 DIAGNOSIS — G9340 Encephalopathy, unspecified: Secondary | ICD-10-CM | POA: Diagnosis not present

## 2019-07-01 DIAGNOSIS — F329 Major depressive disorder, single episode, unspecified: Secondary | ICD-10-CM | POA: Diagnosis not present

## 2019-07-01 DIAGNOSIS — F79 Unspecified intellectual disabilities: Secondary | ICD-10-CM | POA: Diagnosis not present

## 2019-07-01 DIAGNOSIS — N1831 Chronic kidney disease, stage 3a: Secondary | ICD-10-CM | POA: Diagnosis not present

## 2019-07-01 DIAGNOSIS — G9340 Encephalopathy, unspecified: Secondary | ICD-10-CM | POA: Diagnosis not present

## 2019-07-01 DIAGNOSIS — F4310 Post-traumatic stress disorder, unspecified: Secondary | ICD-10-CM | POA: Diagnosis not present

## 2019-07-01 DIAGNOSIS — F259 Schizoaffective disorder, unspecified: Secondary | ICD-10-CM | POA: Diagnosis not present

## 2019-07-05 DIAGNOSIS — F4310 Post-traumatic stress disorder, unspecified: Secondary | ICD-10-CM | POA: Diagnosis not present

## 2019-07-05 DIAGNOSIS — F79 Unspecified intellectual disabilities: Secondary | ICD-10-CM | POA: Diagnosis not present

## 2019-07-05 DIAGNOSIS — F259 Schizoaffective disorder, unspecified: Secondary | ICD-10-CM | POA: Diagnosis not present

## 2019-07-05 DIAGNOSIS — N1831 Chronic kidney disease, stage 3a: Secondary | ICD-10-CM | POA: Diagnosis not present

## 2019-07-05 DIAGNOSIS — G9340 Encephalopathy, unspecified: Secondary | ICD-10-CM | POA: Diagnosis not present

## 2019-07-05 DIAGNOSIS — F329 Major depressive disorder, single episode, unspecified: Secondary | ICD-10-CM | POA: Diagnosis not present

## 2019-07-08 DIAGNOSIS — F259 Schizoaffective disorder, unspecified: Secondary | ICD-10-CM | POA: Diagnosis not present

## 2019-07-08 DIAGNOSIS — F329 Major depressive disorder, single episode, unspecified: Secondary | ICD-10-CM | POA: Diagnosis not present

## 2019-07-08 DIAGNOSIS — G9340 Encephalopathy, unspecified: Secondary | ICD-10-CM | POA: Diagnosis not present

## 2019-07-08 DIAGNOSIS — F609 Personality disorder, unspecified: Secondary | ICD-10-CM | POA: Diagnosis not present

## 2019-07-08 DIAGNOSIS — F70 Mild intellectual disabilities: Secondary | ICD-10-CM | POA: Diagnosis not present

## 2019-07-08 DIAGNOSIS — F4310 Post-traumatic stress disorder, unspecified: Secondary | ICD-10-CM | POA: Diagnosis not present

## 2019-07-08 DIAGNOSIS — F251 Schizoaffective disorder, depressive type: Secondary | ICD-10-CM | POA: Diagnosis not present

## 2019-07-08 DIAGNOSIS — N1831 Chronic kidney disease, stage 3a: Secondary | ICD-10-CM | POA: Diagnosis not present

## 2019-07-08 DIAGNOSIS — F79 Unspecified intellectual disabilities: Secondary | ICD-10-CM | POA: Diagnosis not present

## 2019-07-09 DIAGNOSIS — F259 Schizoaffective disorder, unspecified: Secondary | ICD-10-CM | POA: Diagnosis not present

## 2019-07-09 DIAGNOSIS — F329 Major depressive disorder, single episode, unspecified: Secondary | ICD-10-CM | POA: Diagnosis not present

## 2019-07-09 DIAGNOSIS — G9340 Encephalopathy, unspecified: Secondary | ICD-10-CM | POA: Diagnosis not present

## 2019-07-09 DIAGNOSIS — N1831 Chronic kidney disease, stage 3a: Secondary | ICD-10-CM | POA: Diagnosis not present

## 2019-07-09 DIAGNOSIS — F4310 Post-traumatic stress disorder, unspecified: Secondary | ICD-10-CM | POA: Diagnosis not present

## 2019-07-09 DIAGNOSIS — F79 Unspecified intellectual disabilities: Secondary | ICD-10-CM | POA: Diagnosis not present

## 2019-07-12 DIAGNOSIS — N1831 Chronic kidney disease, stage 3a: Secondary | ICD-10-CM | POA: Diagnosis not present

## 2019-07-12 DIAGNOSIS — F79 Unspecified intellectual disabilities: Secondary | ICD-10-CM | POA: Diagnosis not present

## 2019-07-12 DIAGNOSIS — F4310 Post-traumatic stress disorder, unspecified: Secondary | ICD-10-CM | POA: Diagnosis not present

## 2019-07-12 DIAGNOSIS — F329 Major depressive disorder, single episode, unspecified: Secondary | ICD-10-CM | POA: Diagnosis not present

## 2019-07-12 DIAGNOSIS — G9340 Encephalopathy, unspecified: Secondary | ICD-10-CM | POA: Diagnosis not present

## 2019-07-12 DIAGNOSIS — F259 Schizoaffective disorder, unspecified: Secondary | ICD-10-CM | POA: Diagnosis not present

## 2019-07-14 DIAGNOSIS — F259 Schizoaffective disorder, unspecified: Secondary | ICD-10-CM | POA: Diagnosis not present

## 2019-07-14 DIAGNOSIS — E039 Hypothyroidism, unspecified: Secondary | ICD-10-CM | POA: Diagnosis not present

## 2019-07-14 DIAGNOSIS — F2089 Other schizophrenia: Secondary | ICD-10-CM | POA: Diagnosis not present

## 2019-07-14 DIAGNOSIS — N1831 Chronic kidney disease, stage 3a: Secondary | ICD-10-CM | POA: Diagnosis not present

## 2019-07-14 DIAGNOSIS — F4310 Post-traumatic stress disorder, unspecified: Secondary | ICD-10-CM | POA: Diagnosis not present

## 2019-07-14 DIAGNOSIS — F79 Unspecified intellectual disabilities: Secondary | ICD-10-CM | POA: Diagnosis not present

## 2019-07-14 DIAGNOSIS — F329 Major depressive disorder, single episode, unspecified: Secondary | ICD-10-CM | POA: Diagnosis not present

## 2019-07-14 DIAGNOSIS — G9340 Encephalopathy, unspecified: Secondary | ICD-10-CM | POA: Diagnosis not present

## 2019-07-15 DIAGNOSIS — F4310 Post-traumatic stress disorder, unspecified: Secondary | ICD-10-CM | POA: Diagnosis not present

## 2019-07-15 DIAGNOSIS — N1831 Chronic kidney disease, stage 3a: Secondary | ICD-10-CM | POA: Diagnosis not present

## 2019-07-15 DIAGNOSIS — G9340 Encephalopathy, unspecified: Secondary | ICD-10-CM | POA: Diagnosis not present

## 2019-07-15 DIAGNOSIS — F329 Major depressive disorder, single episode, unspecified: Secondary | ICD-10-CM | POA: Diagnosis not present

## 2019-07-15 DIAGNOSIS — F79 Unspecified intellectual disabilities: Secondary | ICD-10-CM | POA: Diagnosis not present

## 2019-07-15 DIAGNOSIS — F259 Schizoaffective disorder, unspecified: Secondary | ICD-10-CM | POA: Diagnosis not present

## 2019-07-20 DIAGNOSIS — N1831 Chronic kidney disease, stage 3a: Secondary | ICD-10-CM | POA: Diagnosis not present

## 2019-07-20 DIAGNOSIS — F4310 Post-traumatic stress disorder, unspecified: Secondary | ICD-10-CM | POA: Diagnosis not present

## 2019-07-20 DIAGNOSIS — F259 Schizoaffective disorder, unspecified: Secondary | ICD-10-CM | POA: Diagnosis not present

## 2019-07-20 DIAGNOSIS — F329 Major depressive disorder, single episode, unspecified: Secondary | ICD-10-CM | POA: Diagnosis not present

## 2019-07-20 DIAGNOSIS — G9340 Encephalopathy, unspecified: Secondary | ICD-10-CM | POA: Diagnosis not present

## 2019-07-20 DIAGNOSIS — F79 Unspecified intellectual disabilities: Secondary | ICD-10-CM | POA: Diagnosis not present

## 2019-07-22 DIAGNOSIS — F79 Unspecified intellectual disabilities: Secondary | ICD-10-CM | POA: Diagnosis not present

## 2019-07-22 DIAGNOSIS — G9340 Encephalopathy, unspecified: Secondary | ICD-10-CM | POA: Diagnosis not present

## 2019-07-22 DIAGNOSIS — F259 Schizoaffective disorder, unspecified: Secondary | ICD-10-CM | POA: Diagnosis not present

## 2019-07-22 DIAGNOSIS — F4310 Post-traumatic stress disorder, unspecified: Secondary | ICD-10-CM | POA: Diagnosis not present

## 2019-07-22 DIAGNOSIS — N1831 Chronic kidney disease, stage 3a: Secondary | ICD-10-CM | POA: Diagnosis not present

## 2019-07-22 DIAGNOSIS — F329 Major depressive disorder, single episode, unspecified: Secondary | ICD-10-CM | POA: Diagnosis not present

## 2019-07-23 DIAGNOSIS — F329 Major depressive disorder, single episode, unspecified: Secondary | ICD-10-CM | POA: Diagnosis not present

## 2019-07-23 DIAGNOSIS — F4310 Post-traumatic stress disorder, unspecified: Secondary | ICD-10-CM | POA: Diagnosis not present

## 2019-07-23 DIAGNOSIS — F259 Schizoaffective disorder, unspecified: Secondary | ICD-10-CM | POA: Diagnosis not present

## 2019-07-23 DIAGNOSIS — N1831 Chronic kidney disease, stage 3a: Secondary | ICD-10-CM | POA: Diagnosis not present

## 2019-07-23 DIAGNOSIS — F79 Unspecified intellectual disabilities: Secondary | ICD-10-CM | POA: Diagnosis not present

## 2019-07-23 DIAGNOSIS — R41841 Cognitive communication deficit: Secondary | ICD-10-CM | POA: Diagnosis not present

## 2019-07-23 DIAGNOSIS — R479 Unspecified speech disturbances: Secondary | ICD-10-CM | POA: Diagnosis not present

## 2019-07-23 DIAGNOSIS — G9340 Encephalopathy, unspecified: Secondary | ICD-10-CM | POA: Diagnosis not present

## 2019-07-23 DIAGNOSIS — R2681 Unsteadiness on feet: Secondary | ICD-10-CM | POA: Diagnosis not present

## 2019-07-26 DIAGNOSIS — G9340 Encephalopathy, unspecified: Secondary | ICD-10-CM | POA: Diagnosis not present

## 2019-07-26 DIAGNOSIS — F259 Schizoaffective disorder, unspecified: Secondary | ICD-10-CM | POA: Diagnosis not present

## 2019-07-26 DIAGNOSIS — F4310 Post-traumatic stress disorder, unspecified: Secondary | ICD-10-CM | POA: Diagnosis not present

## 2019-07-26 DIAGNOSIS — N1831 Chronic kidney disease, stage 3a: Secondary | ICD-10-CM | POA: Diagnosis not present

## 2019-07-26 DIAGNOSIS — F329 Major depressive disorder, single episode, unspecified: Secondary | ICD-10-CM | POA: Diagnosis not present

## 2019-07-26 DIAGNOSIS — F79 Unspecified intellectual disabilities: Secondary | ICD-10-CM | POA: Diagnosis not present

## 2019-07-28 DIAGNOSIS — F4310 Post-traumatic stress disorder, unspecified: Secondary | ICD-10-CM | POA: Diagnosis not present

## 2019-07-28 DIAGNOSIS — G9340 Encephalopathy, unspecified: Secondary | ICD-10-CM | POA: Diagnosis not present

## 2019-07-28 DIAGNOSIS — F259 Schizoaffective disorder, unspecified: Secondary | ICD-10-CM | POA: Diagnosis not present

## 2019-07-28 DIAGNOSIS — F79 Unspecified intellectual disabilities: Secondary | ICD-10-CM | POA: Diagnosis not present

## 2019-07-28 DIAGNOSIS — N1831 Chronic kidney disease, stage 3a: Secondary | ICD-10-CM | POA: Diagnosis not present

## 2019-07-28 DIAGNOSIS — F329 Major depressive disorder, single episode, unspecified: Secondary | ICD-10-CM | POA: Diagnosis not present

## 2019-08-05 DIAGNOSIS — F4310 Post-traumatic stress disorder, unspecified: Secondary | ICD-10-CM | POA: Diagnosis not present

## 2019-08-05 DIAGNOSIS — J9801 Acute bronchospasm: Secondary | ICD-10-CM | POA: Diagnosis not present

## 2019-08-05 DIAGNOSIS — F259 Schizoaffective disorder, unspecified: Secondary | ICD-10-CM | POA: Diagnosis not present

## 2019-08-05 DIAGNOSIS — N1831 Chronic kidney disease, stage 3a: Secondary | ICD-10-CM | POA: Diagnosis not present

## 2019-08-05 DIAGNOSIS — G9340 Encephalopathy, unspecified: Secondary | ICD-10-CM | POA: Diagnosis not present

## 2019-08-05 DIAGNOSIS — F329 Major depressive disorder, single episode, unspecified: Secondary | ICD-10-CM | POA: Diagnosis not present

## 2019-08-05 DIAGNOSIS — F79 Unspecified intellectual disabilities: Secondary | ICD-10-CM | POA: Diagnosis not present

## 2019-08-05 DIAGNOSIS — J302 Other seasonal allergic rhinitis: Secondary | ICD-10-CM | POA: Diagnosis not present

## 2019-08-19 DIAGNOSIS — J9801 Acute bronchospasm: Secondary | ICD-10-CM | POA: Diagnosis not present

## 2019-08-23 NOTE — Patient Instructions (Addendum)
Mixed rhinitis Continue cetirizine 10 mg once a day as needed for runny nose Continue the nasal Flonase 2 sprays in each nostril once a day for a stuffy nose for the next 2 weeks. At that time, return to the as needed schedule Right nostril point the applicator toward the right ear. Left nostril point the applicator toward the left ear Continue montelukast 10 mg once a day Continue azelastine nasal spray 2 sprays twice a day as needed for a runny nose or sneezing Continue saline nasal rinses twice a day for 2 weeks, then return to the as needed schedule  Cough Continue the treatment plan as listed above Continue albuterol once every 4 hours as needed with a spacer for cough or wheeze  Allergic conjunctivitis Continue [place] eye drops one drop in each eye once a day as needed for red, itchy eyes  Call me if this treatment plan is not working well for you  Follow up in 2 months or sooner if needed

## 2019-08-23 NOTE — Progress Notes (Addendum)
[address] [place] [address] Dept: [phone]  FOLLOW UP NOTE  Patient ID: Sonya Moran, female    DOB: [birth year]  Age: 44 y.o. MRN: [id number] Date of Office Visit: 08/24/2019  Assessment  Chief Complaint: Cough  HPI Sonya Moran is a 44 year old female who presents to the clinic for follow-up visit.  She was last seen in this clinic on 03/01/2019 for evaluation of allergic rhinitis, allergic conjunctivitis, and eustachian tube dysfunction.  She is accompanied by her caregiver who assists with history.  In the interim, she has been hospitalized for lithium toxicity with acute kidney injury and shortness of breath for which she was discharged with an albuterol inhaler as needed.  At today's visit she reports allergic rhinitis has been moderately well controlled with symptoms including occasional sneeze and some postnasal drainage.  She continues cetirizine 10 mg daily, azelastine 2 sprays in each nostril twice a day, and Flonase as needed.  Allergic conjunctivitis is reported as well controlled with Pazeo as needed.  She reports that she continues to experience a cough that is slowly beginning to improve.  She did see her primary care doctor, Dr. Criss Moran, who prescribed Adair Patter, however, [name] was unable to effectively use this medication.  She does continue albuterol through the nebulizer with improvement in her cough and albuterol via inhaler.  She does not currently have a spacer for her inhaler.  She denies shortness of breath and wheeze.  She reports reflux as well controlled with no symptoms including heartburn or vomiting and continues Dexilant 60 mg once a day.  Her current medications are listed in the chart.   Drug Allergies:  Allergies  Allergen Reactions  . Grifulvin V [Griseofulvin] Anaphylaxis and Other (See Comments)    Arm swelling    Physical Exam: BP 122/76 (BP Location: Right Arm, Patient Position: Sitting, Cuff Size: Normal)   Pulse 76   Temp 98.1 F  (36.7 C) (Oral)   Resp 19   Ht 4' 10.8" (1.494 m)   Wt 155 lb 9.6 oz (70.6 kg)   SpO2 94%   BMI 31.64 kg/m    Physical Exam Vitals reviewed.  Constitutional:      Appearance: Normal appearance.  HENT:     Head: Normocephalic and atraumatic.     Right Ear: Tympanic membrane normal.     Left Ear: Tympanic membrane normal.     Nose:     Comments: Bilateral nares slightly erythematous with clear nasal drainage noted.  Pharynx normal.  Ears normal.  Eyes normal.    Mouth/Throat:     Pharynx: Oropharynx is clear.  Eyes:     Conjunctiva/sclera: Conjunctivae normal.  Cardiovascular:     Rate and Rhythm: Normal rate and regular rhythm.     Heart sounds: Normal heart sounds. No murmur heard.   Pulmonary:     Effort: Pulmonary effort is normal.     Breath sounds: Normal breath sounds.     Comments: Lungs clear to auscultation Musculoskeletal:        General: Normal range of motion.     Cervical back: Normal range of motion and neck supple.  Skin:    General: Skin is warm and dry.  Neurological:     Mental Status: She is alert and oriented to person, place, and time.  Psychiatric:        Mood and Affect: Mood normal.        Behavior: Behavior normal.  Thought Content: Thought content normal.        Judgment: Judgment normal.    Diagnostics: Spirometry attempted and recorded, however, does not meet ATS requirements.  There will be no charge on this spirometry FVC 0.59, FEV1 0.22.  Predicted FVC 2.11, predicted FEV1 1.74. Patient had difficulty following directions during the spirometry testing.  Assessment and Plan: 1. Mixed rhinitis   2. Cough   3. Seasonal allergic conjunctivitis     Patient Instructions  Mixed rhinitis Continue cetirizine 10 mg once a day as needed for runny nose Continue the nasal Flonase 2 sprays in each nostril once a day for a stuffy nose for the next 2 weeks. At that time, return to the as needed schedule Right nostril point the applicator  toward the right ear. Left nostril point the applicator toward the left ear Continue montelukast 10 mg once a day Continue azelastine nasal spray 2 sprays twice a day as needed for a runny nose or sneezing Continue saline nasal rinses twice a day for 2 weeks, then return to the as needed schedule  Cough Continue the treatment plan as listed above Continue albuterol once every 4 hours as needed with a spacer for cough or wheeze  Allergic conjunctivitis Continue Pazeo eye drops one drop in each eye once a day as needed for red, itchy eyes  Call me if this treatment plan is not working well for you  Follow up in 2 months or sooner if needed   Return in about 2 months (around 10/24/2019), or if symptoms worsen or fail to improve.    Thank you for the opportunity to care for this patient.  Please do not hesitate to contact me with questions.  [name], FNP [facility]  ________________________________________________  I have provided oversight concerning Sonya Moran's evaluation and treatment of this patient's health issues addressed during today's encounter.  I agree with the assessment and therapeutic plan as outlined in the note.   Signed,   R Edgar Frisk, MD

## 2019-08-24 ENCOUNTER — Encounter: Payer: Self-pay | Admitting: Family Medicine

## 2019-08-24 ENCOUNTER — Ambulatory Visit (INDEPENDENT_AMBULATORY_CARE_PROVIDER_SITE_OTHER): Payer: Medicare Other | Admitting: Family Medicine

## 2019-08-24 ENCOUNTER — Other Ambulatory Visit: Payer: Self-pay

## 2019-08-24 VITALS — BP 122/76 | HR 76 | Temp 98.1°F | Resp 19 | Ht 58.8 in | Wt 155.6 lb

## 2019-08-24 DIAGNOSIS — J310 Chronic rhinitis: Secondary | ICD-10-CM | POA: Diagnosis not present

## 2019-08-24 DIAGNOSIS — H1010 Acute atopic conjunctivitis, unspecified eye: Secondary | ICD-10-CM

## 2019-08-24 DIAGNOSIS — R05 Cough: Secondary | ICD-10-CM | POA: Diagnosis not present

## 2019-08-24 DIAGNOSIS — R059 Cough, unspecified: Secondary | ICD-10-CM

## 2019-08-30 DIAGNOSIS — H10413 Chronic giant papillary conjunctivitis, bilateral: Secondary | ICD-10-CM | POA: Diagnosis not present

## 2019-09-02 DIAGNOSIS — J45909 Unspecified asthma, uncomplicated: Secondary | ICD-10-CM | POA: Diagnosis not present

## 2019-09-02 DIAGNOSIS — J449 Chronic obstructive pulmonary disease, unspecified: Secondary | ICD-10-CM | POA: Diagnosis not present

## 2019-09-14 DIAGNOSIS — F79 Unspecified intellectual disabilities: Secondary | ICD-10-CM | POA: Diagnosis not present

## 2019-09-14 DIAGNOSIS — N1831 Chronic kidney disease, stage 3a: Secondary | ICD-10-CM | POA: Diagnosis not present

## 2019-09-14 DIAGNOSIS — E039 Hypothyroidism, unspecified: Secondary | ICD-10-CM | POA: Diagnosis not present

## 2019-09-14 DIAGNOSIS — F2089 Other schizophrenia: Secondary | ICD-10-CM | POA: Diagnosis not present

## 2019-10-08 ENCOUNTER — Other Ambulatory Visit: Payer: Self-pay | Admitting: Family Medicine

## 2019-10-14 DIAGNOSIS — N771 Vaginitis, vulvitis and vulvovaginitis in diseases classified elsewhere: Secondary | ICD-10-CM | POA: Diagnosis not present

## 2019-10-14 DIAGNOSIS — Z6832 Body mass index (BMI) 32.0-32.9, adult: Secondary | ICD-10-CM | POA: Diagnosis not present

## 2019-10-18 ENCOUNTER — Ambulatory Visit: Payer: Self-pay | Admitting: Family Medicine

## 2019-10-25 ENCOUNTER — Other Ambulatory Visit: Payer: Self-pay

## 2019-10-25 ENCOUNTER — Encounter: Payer: Self-pay | Admitting: Family Medicine

## 2019-10-25 ENCOUNTER — Ambulatory Visit (INDEPENDENT_AMBULATORY_CARE_PROVIDER_SITE_OTHER): Payer: Medicare Other | Admitting: Family Medicine

## 2019-10-25 VITALS — BP 110/82 | HR 79 | Temp 98.4°F | Resp 18

## 2019-10-25 DIAGNOSIS — J310 Chronic rhinitis: Secondary | ICD-10-CM

## 2019-10-25 DIAGNOSIS — H1010 Acute atopic conjunctivitis, unspecified eye: Secondary | ICD-10-CM | POA: Diagnosis not present

## 2019-10-25 DIAGNOSIS — R059 Cough, unspecified: Secondary | ICD-10-CM | POA: Diagnosis not present

## 2019-10-25 MED ORDER — OLOPATADINE HCL 0.2 % OP SOLN: OPHTHALMIC | 3 refills | Status: DC

## 2019-10-25 NOTE — Progress Notes (Signed)
[address] [place] [address] Dept: [phone]  FOLLOW UP NOTE  Patient ID: Sonya Moran, female    DOB: 02-25-1975  Age: 44 y.o. MRN: [id number] Date of Office Visit: 10/25/2019  Assessment  Chief Complaint: Other (Mixed rhinitis)  HPI Sonya Moran is a 44 year old female who presents to the clinic for follow-up visit.  She was last seen in this clinic on 08/24/2019 for evaluation of mixed rhinitis, allergic conjunctivitis, and cough.  She is accompanied by her caregiver who assists with history.  At today's visit she reports her cough has largely resolved with only intermittent coughing which is producing clear mucus at this time.  She denies shortness of breath and wheeze with activity and rest.  She reports that she has not used albuterol via inhaler or nebulizer for the last several weeks.  Allergic rhinitis is reported as moderately well controlled with nasal congestion occurring frequently for which she continues cetirizine 10 mg a day, Flonase once a day as needed, montelukast 10 mg once a day, azelastine twice a day as needed, and saline rinses once a day.  Allergic conjunctivitis is reported as moderately well controlled.  She is not currently using an allergy eyedrop at this time.  Her current medications are listed in the chart.   Drug Allergies:  Allergies  Allergen Reactions  . Grifulvin V [Griseofulvin] Anaphylaxis and Other (See Comments)    Arm swelling    Physical Exam: BP 110/82   Pulse 79   Temp 98.4 F (36.9 C) (Temporal)   Resp 18   SpO2 96%    Physical Exam Vitals reviewed.  Constitutional:      Appearance: Normal appearance.  HENT:     Head: Normocephalic and atraumatic.     Right Ear: Tympanic membrane normal.     Left Ear: Tympanic membrane normal.     Nose:     Comments: Bilateral nares normal.  Pharynx normal.  Ears normal.  Eyes normal.    Mouth/Throat:     Pharynx: Oropharynx is clear.  Eyes:     Conjunctiva/sclera: Conjunctivae  normal.  Cardiovascular:     Rate and Rhythm: Normal rate and regular rhythm.     Heart sounds: Normal heart sounds. No murmur heard.   Pulmonary:     Effort: Pulmonary effort is normal.     Breath sounds: Normal breath sounds.     Comments: Lungs clear to auscultation Musculoskeletal:        General: Normal range of motion.     Cervical back: Normal range of motion and neck supple.  Skin:    General: Skin is warm and dry.  Neurological:     Mental Status: She is alert and oriented to person, place, and time.  Psychiatric:        Mood and Affect: Mood normal.        Behavior: Behavior normal.        Thought Content: Thought content normal.        Judgment: Judgment normal.    Diagnostics: Spirometry did not meet diagnostic criteria.  Patient not able to follow direction well enough to perform spirometry  Assessment and Plan: 1. Mixed rhinitis   2. Cough   3. Seasonal allergic conjunctivitis     Meds ordered this encounter  Medications  . Olopatadine HCl (PATADAY) 0.2 % SOLN    Sig: One drop each eye daily as needed    Dispense:  2.5 mL    Refill:  3  Patient Instructions  Mixed rhinitis Continue cetirizine 10 mg once a day as needed for runny nose Continue the nasal Flonase 2 sprays in each nostril once a day for a stuffy nose for the next 2 weeks. At that time, return to the as needed schedule Right nostril point the applicator toward the right ear. Left nostril point the applicator toward the left ear Continue montelukast 10 mg once a day Continue azelastine nasal spray 2 sprays twice a day as needed for a runny nose or sneezing Continue saline nasal rinses as needed for nasal symptoms. Use this before any medicated nasal sprays for best result   Cough Stable Continue the treatment plan as listed above Continue albuterol once every 4 hours as needed with a spacer for cough or wheeze  Allergic conjunctivitis Begin Pataday (olopatadine 0.2%) eye drops one drop  in each eye once a day as needed for red, itchy eyes  Call me if this treatment plan is not working well for you  Follow up in 6 months or sooner if needed   Return in about 6 months (around 04/24/2020), or if symptoms worsen or fail to improve.    Thank you for the opportunity to care for this patient.  Please do not hesitate to contact me with questions.  [name], FNP [facility]

## 2019-10-25 NOTE — Patient Instructions (Addendum)
Mixed rhinitis Continue cetirizine 10 mg once a day as needed for runny nose Continue the nasal Flonase 2 sprays in each nostril once a day for a stuffy nose for the next 2 weeks. At that time, return to the as needed schedule Right nostril point the applicator toward the right ear. Left nostril point the applicator toward the left ear Continue montelukast 10 mg once a day Continue azelastine nasal spray 2 sprays twice a day as needed for a runny nose or sneezing Continue saline nasal rinses as needed for nasal symptoms. Use this before any medicated nasal sprays for best result   Cough Stable Continue the treatment plan as listed above Continue albuterol once every 4 hours as needed with a spacer for cough or wheeze  Allergic conjunctivitis Begin Pataday (olopatadine 0.2%) eye drops one drop in each eye once a day as needed for red, itchy eyes  Call me if this treatment plan is not working well for you  Follow up in 6 months or sooner if needed

## 2019-11-04 DIAGNOSIS — F79 Unspecified intellectual disabilities: Secondary | ICD-10-CM | POA: Diagnosis not present

## 2019-11-04 DIAGNOSIS — Z23 Encounter for immunization: Secondary | ICD-10-CM | POA: Diagnosis not present

## 2019-11-04 DIAGNOSIS — F2089 Other schizophrenia: Secondary | ICD-10-CM | POA: Diagnosis not present

## 2019-11-06 ENCOUNTER — Other Ambulatory Visit: Payer: Self-pay | Admitting: Family Medicine

## 2019-11-08 DIAGNOSIS — Z23 Encounter for immunization: Secondary | ICD-10-CM | POA: Diagnosis not present

## 2019-11-15 DIAGNOSIS — F70 Mild intellectual disabilities: Secondary | ICD-10-CM | POA: Diagnosis not present

## 2019-11-15 DIAGNOSIS — F251 Schizoaffective disorder, depressive type: Secondary | ICD-10-CM | POA: Diagnosis not present

## 2019-11-15 DIAGNOSIS — F609 Personality disorder, unspecified: Secondary | ICD-10-CM | POA: Diagnosis not present

## 2019-12-13 ENCOUNTER — Other Ambulatory Visit: Payer: Self-pay | Admitting: Family Medicine

## 2019-12-28 DIAGNOSIS — Z23 Encounter for immunization: Secondary | ICD-10-CM | POA: Diagnosis not present

## 2020-01-25 DIAGNOSIS — J393 Upper respiratory tract hypersensitivity reaction, site unspecified: Secondary | ICD-10-CM | POA: Diagnosis not present

## 2020-01-25 DIAGNOSIS — F79 Unspecified intellectual disabilities: Secondary | ICD-10-CM | POA: Diagnosis not present

## 2020-01-25 DIAGNOSIS — Z20822 Contact with and (suspected) exposure to covid-19: Secondary | ICD-10-CM | POA: Diagnosis not present

## 2020-02-06 ENCOUNTER — Encounter (HOSPITAL_BASED_OUTPATIENT_CLINIC_OR_DEPARTMENT_OTHER): Payer: Self-pay | Admitting: *Deleted

## 2020-02-06 ENCOUNTER — Emergency Department (HOSPITAL_BASED_OUTPATIENT_CLINIC_OR_DEPARTMENT_OTHER): Payer: Medicare Other

## 2020-02-06 ENCOUNTER — Emergency Department (HOSPITAL_BASED_OUTPATIENT_CLINIC_OR_DEPARTMENT_OTHER)
Admission: EM | Admit: 2020-02-06 | Discharge: 2020-02-06 | Disposition: A | Discharge status: Home / Self Care | Payer: Medicare Other | Attending: Emergency Medicine | Admitting: Emergency Medicine

## 2020-02-06 ENCOUNTER — Other Ambulatory Visit: Payer: Self-pay

## 2020-02-06 DIAGNOSIS — E039 Hypothyroidism, unspecified: Secondary | ICD-10-CM | POA: Diagnosis not present

## 2020-02-06 DIAGNOSIS — N281 Cyst of kidney, acquired: Secondary | ICD-10-CM | POA: Insufficient documentation

## 2020-02-06 DIAGNOSIS — R109 Unspecified abdominal pain: Secondary | ICD-10-CM | POA: Diagnosis not present

## 2020-02-06 DIAGNOSIS — N183 Chronic kidney disease, stage 3 unspecified: Secondary | ICD-10-CM | POA: Diagnosis not present

## 2020-02-06 DIAGNOSIS — N3289 Other specified disorders of bladder: Secondary | ICD-10-CM | POA: Diagnosis not present

## 2020-02-06 LAB — CBC WITH DIFFERENTIAL/PLATELET
Abs Immature Granulocytes: 0.02 10*3/uL (ref 0.00–0.07)
Basophils Absolute: 0 10*3/uL (ref 0.0–0.1)
Basophils Relative: 1 %
Eosinophils Absolute: 0.1 10*3/uL (ref 0.0–0.5)
Eosinophils Relative: 2 %
HCT: 39.1 % (ref 36.0–46.0)
Hemoglobin: 13 g/dL (ref 12.0–15.0)
Immature Granulocytes: 0 %
Lymphocytes Relative: 29 %
Lymphs Abs: 1.9 10*3/uL (ref 0.7–4.0)
MCH: 29.5 pg (ref 26.0–34.0)
MCHC: 33.2 g/dL (ref 30.0–36.0)
MCV: 88.7 fL (ref 80.0–100.0)
Monocytes Absolute: 0.5 10*3/uL (ref 0.1–1.0)
Monocytes Relative: 8 %
Neutro Abs: 3.9 10*3/uL (ref 1.7–7.7)
Neutrophils Relative %: 60 %
Platelets: 112 10*3/uL — ABNORMAL LOW (ref 150–400)
RBC: 4.41 MIL/uL (ref 3.87–5.11)
RDW: 13.2 % (ref 11.5–15.5)
WBC: 6.4 10*3/uL (ref 4.0–10.5)
nRBC: 0 % (ref 0.0–0.2)

## 2020-02-06 LAB — PREGNANCY, URINE: Preg Test, Ur: NEGATIVE

## 2020-02-06 LAB — URINALYSIS, ROUTINE W REFLEX MICROSCOPIC
Bilirubin Urine: NEGATIVE
Glucose, UA: NEGATIVE mg/dL
Hgb urine dipstick: NEGATIVE
Ketones, ur: NEGATIVE mg/dL
Nitrite: NEGATIVE
Protein, ur: NEGATIVE mg/dL
Specific Gravity, Urine: 1.005 (ref 1.005–1.030)
pH: 5 (ref 5.0–8.0)

## 2020-02-06 LAB — COMPREHENSIVE METABOLIC PANEL
ALT: 31 U/L (ref 0–44)
AST: 30 U/L (ref 15–41)
Albumin: 4 g/dL (ref 3.5–5.0)
Alkaline Phosphatase: 120 U/L (ref 38–126)
Anion gap: 10 (ref 5–15)
BUN: 17 mg/dL (ref 6–20)
CO2: 29 mmol/L (ref 22–32)
Calcium: 10.1 mg/dL (ref 8.9–10.3)
Chloride: 103 mmol/L (ref 98–111)
Creatinine, Ser: 1.2 mg/dL — ABNORMAL HIGH (ref 0.44–1.00)
GFR, Estimated: 57 mL/min — ABNORMAL LOW (ref >60)
Glucose, Bld: 101 mg/dL — ABNORMAL HIGH (ref 70–99)
Potassium: 4.2 mmol/L (ref 3.5–5.1)
Sodium: 142 mmol/L (ref 135–145)
Total Bilirubin: 0.1 mg/dL — ABNORMAL LOW (ref 0.3–1.2)
Total Protein: 7.6 g/dL (ref 6.5–8.1)

## 2020-02-06 LAB — URINALYSIS, MICROSCOPIC (REFLEX): RBC / HPF: NONE SEEN RBC/hpf (ref 0–5)

## 2020-02-06 MED ORDER — POLYETHYLENE GLYCOL 3350 17 G PO PACK: 17.0000 g | PACK | Freq: Every day | ORAL | 0 refills | Status: DC | PRN

## 2020-02-06 NOTE — ED Provider Notes (Signed)
[facility] [facility] EMERGENCY DEPARTMENT Provider Note   CSN: [id number] Arrival date & time: 02/06/20  1303     History Chief Complaint  Patient presents [id number]  . Flank Pain    Sonya Moran is a 45 y.o. female with past medical history of mental retardation, gets affective disorder, CKD, PTSD, presenting to the emergency department with her caregiver for left flank pain.  Pain initially began 2 weeks ago.  PCP was planning for outpatient ultrasound, however symptoms had resolved.  She also had negative COVID test during that time which was done for nasal congestion.  Her caregiver states the pain returned yesterday after she had cleaned out her room and was dragging a heavy bag.  Patient points to her left flank regarding the pain.  She states it is worse with movement.  She is not having any dysuria, nausea, vomiting, fevers.  She had normal bowel movement a couple of days ago.  She has no pain in her legs.  The history is provided by the patient and a caregiver.       Past Medical History:  Diagnosis Date  . Depressive disorder   . Hypothyroidism 06/16/2019  . Mental retardation   . Post traumatic stress disorder   . Psychotic disorder [facility](HCC)     Patient Active Problem List   Diagnosis Date Noted  . Acute encephalopathy 06/16/2019  . Hypothyroidism 06/16/2019  . AKI (acute kidney injury) (HCC) 06/16/2019  . Schizoaffective disorder, bipolar type (HCC) 06/07/2019  . Lithium toxicity 06/04/2019  . Acute renal failure superimposed on stage 3 chronic kidney disease (HCC) 06/04/2019  . Allergic rhinitis 03/01/2019  . Mood disorder (HCC) 02/26/2018  . Acute sinusitis 12/26/2017  . Mixed rhinitis 08/01/2017  . Ear pain, bilateral 08/01/2017  . Eustachian tube dysfunction, bilateral 08/01/2017  . Plantar fasciitis of right foot 03/28/2015  . Metatarsal deformity 03/28/2015  . Calcaneal spur 03/28/2015  . Anemia 07/23/2013  . Onychomycosis 06/24/2012    Past Surgical  History:  Procedure Laterality Date  . BACK SURGERY    . WRIST SURGERY       OB History   No obstetric history on file.     No family history on file.  Social History   Tobacco Use  . Smoking status: Never Smoker  . Smokeless tobacco: Never Used  Vaping Use  . Vaping Use: Never used  Substance Use Topics  . Alcohol use: No    Alcohol/week: 0.0 standard drinks  . Drug use: No    Home Medications Prior to Admission medications   Medication Sig Start Date End Date Taking? Authorizing Provider  polyethylene glycol (MIRALAX) 17 g packet Take 17 g by mouth daily as needed (constipation). 02/06/20  Yes Donnamaria Shands, [place] N, PA-C  albuterol (VENTOLIN HFA) 108 (90 Base) MCG/ACT inhaler Inhale 2 puffs into the lungs every 6 (six) hours as needed for wheezing or shortness of breath. 06/22/19   Leroy SeaSingh, Prashant K, MD  amitriptyline (ELAVIL) 100 MG tablet Take 100 mg by mouth at bedtime.  06/11/18   [provider]  Azelastine HCl 0.15 % SOLN 2 SPRAYS EACH NOSTRIL TWICE DAILY. 12/13/19   Hetty BlendAmbs, Anne M, FNP  cetirizine (ZYRTEC) 10 MG tablet TAKE (1) TABLET BY MOUTH ONCE DAILY. 11/08/19   Hetty BlendAmbs, Anne M, FNP  dexlansoprazole (DEXILANT) 60 MG capsule Take 60 mg by mouth daily. Patient not taking: Reported on 10/25/2019    [provider]  fenofibrate (TRICOR) 145 MG tablet Take 145 mg by  mouth at bedtime.     [provider]  fluticasone (FLONASE) 50 MCG/ACT nasal spray 2 SPRAYS INTO BOTH NOSTRILS ONCE A DAY AS NEEDED FOR STUFFY NOSE Patient taking differently: Place 2 sprays into both nostrils daily as needed for allergies or rhinitis. 2 SPRAYS INTO BOTH NOSTRILS ONCE A DAY AS NEEDED FOR STUFFY NOSE 10/09/18   Ambs, Kathrine Cords, FNP  lamoTRIgine (LAMICTAL) 200 MG tablet Take 200 mg by mouth daily.  06/16/18   [provider]  levothyroxine (SYNTHROID) 25 MCG tablet Take 25 mcg by mouth daily.  06/11/18   [provider]  LINZESS 145 MCG CAPS capsule Take 145 mcg by  mouth daily. 02/08/19   [provider]  mirabegron ER (MYRBETRIQ) 25 MG TB24 tablet Take 25 mg by mouth daily.    [provider]  montelukast (SINGULAIR) 10 MG tablet TAKE ONE TABLET BY MOUTH EACH EVENING TO PREVENT COUGH OR WHEEZE Patient taking differently: Take 10 mg by mouth at bedtime.  02/07/17   Valentina Shaggy, MD  OLANZapine (ZYPREXA) 15 MG tablet Take 1 tablet (15 mg total) by mouth daily after breakfast. Patient taking differently: Take 15 mg by mouth 2 (two) times daily.  06/11/19   Mikhail, Velta Addison, DO  Olopatadine HCl (PATADAY) 0.2 % SOLN One drop each eye daily as needed 10/25/19   Ambs, Kathrine Cords, FNP  Olopatadine HCl 0.2 % SOLN Place 1 drop into both eyes daily as needed (for itching). Patient not taking: Reported on 10/25/2019    [provider]  phenol (CHLORASEPTIC) 1.4 % LIQD Use as directed 1 spray in the mouth or throat as needed for throat irritation / pain. 06/10/19   Mikhail, Velta Addison, DO  propranolol (INDERAL) 40 MG tablet Take 40 mg by mouth 2 (two) times daily. 02/15/19   [provider]  SALINE MIST 0.65 % nasal spray 2 SPRAYS EACH NOSTRIL TWICE DAILY. Patient taking differently: Place 2 sprays into the nose in the morning and at bedtime.  02/10/17   Valentina Shaggy, MD    Allergies    Grifulvin v [griseofulvin]  Review of Systems   Review of Systems  All other systems reviewed and are negative.   Physical Exam Updated Vital Signs BP (!) 135/97 (BP Location: Right Arm)   Pulse 70   Temp 98.6 F (37 C) (Oral)   Resp 17   Ht 4\' 11"  (1.499 m)   Wt 79.8 kg   SpO2 100%   BMI 35.53 kg/m   Physical Exam Vitals and nursing note reviewed.  Constitutional:      General: She is not in acute distress.    Appearance: She is well-developed and well-nourished. She is not ill-appearing.  HENT:     Head: Normocephalic and atraumatic.  Eyes:     Conjunctiva/sclera: Conjunctivae normal.  Cardiovascular:     Rate and  Rhythm: Normal rate and regular rhythm.  Pulmonary:     Effort: Pulmonary effort is normal. No respiratory distress.     Breath sounds: Normal breath sounds.  Abdominal:     General: Bowel sounds are normal.     Palpations: Abdomen is soft.     Tenderness: There is abdominal tenderness. There is no guarding or rebound.       Comments: TTP to the left abdomen, left flank  Musculoskeletal:     Comments: TTP to left and generalized midline lumbar region.  No skin changes.  Normal range of motion.  Negative straight leg raise.  No pain with range of motion of the hips.  Skin:    General: Skin is warm.  Neurological:     Mental Status: She is alert.     Comments: Normal tone.  5/5 strength in BLE including strong and equal dorsiflexion/plantar flexion Sensory:  light touch normal in BLE extremities.  Gait: normal gait and balance CV: distal pulses palpable throughout    Psychiatric:        Mood and Affect: Mood and affect normal.        Behavior: Behavior normal.     ED Results / Procedures / Treatments   Labs (all labs ordered are listed, but only abnormal results are displayed) Labs Reviewed  URINALYSIS, ROUTINE W REFLEX MICROSCOPIC - Abnormal; Notable for the following components:      Result Value   Color, Urine STRAW (*)    Leukocytes,Ua SMALL (*)    All other components within normal limits  URINALYSIS, MICROSCOPIC (REFLEX) - Abnormal; Notable for the following components:   Bacteria, UA FEW (*)    All other components within normal limits  CBC WITH DIFFERENTIAL/PLATELET - Abnormal; Notable for the following components:   Platelets 112 (*)    All other components within normal limits  COMPREHENSIVE METABOLIC PANEL - Abnormal; Notable for the following components:   Glucose, Bld 101 (*)    Creatinine, Ser 1.20 (*)    Total Bilirubin 0.1 (*)    GFR, Estimated 57 (*)    All other components within normal limits  PREGNANCY, URINE    EKG None  Radiology CT Renal  Stone Study  Result Date: 02/06/2020 CLINICAL DATA:  45 year old female with flank pain. Concern for kidney stone. EXAM: CT ABDOMEN AND PELVIS WITHOUT CONTRAST TECHNIQUE: Multidetector CT imaging of the abdomen and pelvis was performed following the standard protocol without IV contrast. COMPARISON:  CT abdomen pelvis dated 04/11/2017. FINDINGS: Evaluation of this exam is limited in the absence of intravenous contrast. Lower chest: Minimal bibasilar atelectasis. The visualized lung bases are otherwise clear. No intra-abdominal free air or free fluid. Hepatobiliary: No focal liver abnormality is seen. No gallstones, gallbladder wall thickening, or biliary dilatation. Pancreas: Unremarkable. No pancreatic ductal dilatation or surrounding inflammatory changes. Spleen: Normal in size without focal abnormality. Adrenals/Urinary Tract: The adrenal glands are unremarkable. There is no hydronephrosis or nephrolithiasis on either side. Indeterminate ill-defined 9 mm high attenuating lesion from the anterior lower pole of the right kidney not characterized on this CT but likely a complex/hemorrhagic cyst. Ultrasound may provide better evaluation on a nonemergent/outpatient basis. The visualized ureters appear unremarkable. The urinary bladder is mildly distended. There is slight trabecular appearance of the bladder wall, likely related to chronic bladder dysfunction. Stomach/Bowel: There is moderate stool throughout the colon. There is no bowel obstruction or active inflammation. The appendix is normal. Vascular/Lymphatic: The abdominal aorta and IVC are grossly unremarkable on this noncontrast CT. No portal venous gas. There is no adenopathy. Reproductive: The uterus and ovaries are grossly unremarkable. No adnexal masses. Other: None Musculoskeletal: No acute or significant osseous findings. IMPRESSION: 1. No acute intra-abdominal or pelvic pathology. No hydronephrosis or nephrolithiasis. 2. Moderate colonic stool burden.  No bowel obstruction. Normal appendix. Electronically Signed   By: Anner Crete M.D.   On: 02/06/2020 19:24    Procedures Procedures (including critical care time)  Medications Ordered in ED Medications - No data to display  ED Course  I have reviewed the triage vital signs and the nursing notes.  Pertinent labs &  imaging results that were available during my care of the patient were reviewed by me and considered in my medical decision making (see chart for details).    MDM Rules/Calculators/A&P                          Patient brought in for left flank pain that began last night though had similar pains a couple of weeks ago.  Symptoms began yesterday however after doing some exercise and cleaning her room.  Caregiver states she was dragging a rather heavy bag prior to symptom onset.  On examination she is in no acute distress, afebrile with stable vital signs.  She has some tenderness to the left flank region and left lumbar region.  No neuro deficits, no skin changes.   Considering patient's MR and some limited history overall, CT stone study obtained, basic labs.  Her UA was collected in triage and is unremarkable.   CT is negative for acute findings.  Does show incidental finding about the right kidney, can be followed by PCP. Suspect symptoms may be musculoskeletal vs related to some constipation. Miralax prescribed. Recommend treat pain symptomatically and follow closely with PCP.    Final Clinical Impression(s) / ED Diagnoses Final diagnoses:  Acute left flank pain  Renal cyst, right    Rx / DC Orders ED Discharge Orders         Ordered    polyethylene glycol (MIRALAX) 17 g packet  Daily PRN        02/06/20 2027           Raveena Hebdon, [place] N, PA-C 02/06/20 2029    Drenda Freeze, MD 02/09/20 1032

## 2020-02-06 NOTE — ED Triage Notes (Signed)
Pt has MR and is here with her caregiver who reports pt has had left flank pain approx 2 weeks. She saw her PCP and was supposed to have ultrasound but pain resolved. States pt "was fine last week" but pain came back today. She had a negative covid test last week. Caregiver reports pt had been doing some exercising and was cleaning out her room prior to the pain returning today

## 2020-02-06 NOTE — Discharge Instructions (Signed)
Please follow with your primary care provider for nonemergent outpatient ultrasound of your RIGHT kidney for further characterization of CT findings today.  You can take tylenol as needed for pain. You can take miralax to help produce bowel movements.  Drink lots of water.

## 2020-02-14 DIAGNOSIS — L309 Dermatitis, unspecified: Secondary | ICD-10-CM | POA: Diagnosis not present

## 2020-02-21 DIAGNOSIS — F609 Personality disorder, unspecified: Secondary | ICD-10-CM | POA: Diagnosis not present

## 2020-02-21 DIAGNOSIS — F70 Mild intellectual disabilities: Secondary | ICD-10-CM | POA: Diagnosis not present

## 2020-02-21 DIAGNOSIS — F251 Schizoaffective disorder, depressive type: Secondary | ICD-10-CM | POA: Diagnosis not present

## 2020-03-06 ENCOUNTER — Other Ambulatory Visit: Payer: Self-pay | Admitting: Family Medicine

## 2020-03-06 DIAGNOSIS — N281 Cyst of kidney, acquired: Secondary | ICD-10-CM

## 2020-03-09 ENCOUNTER — Other Ambulatory Visit (HOSPITAL_COMMUNITY)
Admission: RE | Admit: 2020-03-09 | Discharge: 2020-03-09 | Disposition: A | Discharge status: Home / Self Care | Payer: Medicare Other | Source: Ambulatory Visit | Attending: Family Medicine | Admitting: Family Medicine

## 2020-03-09 DIAGNOSIS — N898 Other specified noninflammatory disorders of vagina: Secondary | ICD-10-CM | POA: Diagnosis not present

## 2020-03-09 DIAGNOSIS — N771 Vaginitis, vulvitis and vulvovaginitis in diseases classified elsewhere: Secondary | ICD-10-CM | POA: Diagnosis not present

## 2020-03-09 DIAGNOSIS — J302 Other seasonal allergic rhinitis: Secondary | ICD-10-CM | POA: Diagnosis not present

## 2020-03-10 LAB — MOLECULAR ANCILLARY ONLY
Bacterial Vaginitis (gardnerella): POSITIVE — AB
Candida Glabrata: NEGATIVE
Candida Vaginitis: NEGATIVE
Chlamydia: NEGATIVE
Comment: NEGATIVE
Comment: NEGATIVE
Comment: NEGATIVE
Comment: NEGATIVE
Comment: NEGATIVE
Comment: NORMAL
Neisseria Gonorrhea: NEGATIVE
Trichomonas: NEGATIVE

## 2020-03-13 DIAGNOSIS — L309 Dermatitis, unspecified: Secondary | ICD-10-CM | POA: Diagnosis not present

## 2020-03-15 ENCOUNTER — Ambulatory Visit
Admission: RE | Admit: 2020-03-15 | Discharge: 2020-03-15 | Disposition: A | Discharge status: Home / Self Care | Payer: Medicare Other | Source: Ambulatory Visit | Attending: Family Medicine | Admitting: Family Medicine

## 2020-03-15 DIAGNOSIS — N3289 Other specified disorders of bladder: Secondary | ICD-10-CM | POA: Diagnosis not present

## 2020-03-15 DIAGNOSIS — N281 Cyst of kidney, acquired: Secondary | ICD-10-CM | POA: Diagnosis not present

## 2020-03-23 DIAGNOSIS — N281 Cyst of kidney, acquired: Secondary | ICD-10-CM | POA: Diagnosis not present

## 2020-04-12 DIAGNOSIS — B351 Tinea unguium: Secondary | ICD-10-CM | POA: Diagnosis not present

## 2020-04-12 DIAGNOSIS — B353 Tinea pedis: Secondary | ICD-10-CM | POA: Diagnosis not present

## 2020-04-12 DIAGNOSIS — L309 Dermatitis, unspecified: Secondary | ICD-10-CM | POA: Diagnosis not present

## 2020-04-13 DIAGNOSIS — N281 Cyst of kidney, acquired: Secondary | ICD-10-CM | POA: Diagnosis not present

## 2020-04-13 DIAGNOSIS — B351 Tinea unguium: Secondary | ICD-10-CM | POA: Diagnosis not present

## 2020-04-13 DIAGNOSIS — E039 Hypothyroidism, unspecified: Secondary | ICD-10-CM | POA: Diagnosis not present

## 2020-04-13 DIAGNOSIS — Q6101 Congenital single renal cyst: Secondary | ICD-10-CM | POA: Diagnosis not present

## 2020-04-13 DIAGNOSIS — F2089 Other schizophrenia: Secondary | ICD-10-CM | POA: Diagnosis not present

## 2020-05-11 DIAGNOSIS — L309 Dermatitis, unspecified: Secondary | ICD-10-CM | POA: Diagnosis not present

## 2020-05-13 DIAGNOSIS — N1831 Chronic kidney disease, stage 3a: Secondary | ICD-10-CM | POA: Diagnosis not present

## 2020-05-13 DIAGNOSIS — F2089 Other schizophrenia: Secondary | ICD-10-CM | POA: Diagnosis not present

## 2020-05-13 DIAGNOSIS — E039 Hypothyroidism, unspecified: Secondary | ICD-10-CM | POA: Diagnosis not present

## 2020-05-19 DIAGNOSIS — F251 Schizoaffective disorder, depressive type: Secondary | ICD-10-CM | POA: Diagnosis not present

## 2020-05-19 DIAGNOSIS — F609 Personality disorder, unspecified: Secondary | ICD-10-CM | POA: Diagnosis not present

## 2020-05-19 DIAGNOSIS — F70 Mild intellectual disabilities: Secondary | ICD-10-CM | POA: Diagnosis not present

## 2020-05-26 DIAGNOSIS — U071 COVID-19: Secondary | ICD-10-CM | POA: Diagnosis not present

## 2020-06-08 DIAGNOSIS — R479 Unspecified speech disturbances: Secondary | ICD-10-CM | POA: Diagnosis not present

## 2020-06-08 DIAGNOSIS — E039 Hypothyroidism, unspecified: Secondary | ICD-10-CM | POA: Diagnosis not present

## 2020-06-08 DIAGNOSIS — F69 Unspecified disorder of adult personality and behavior: Secondary | ICD-10-CM | POA: Diagnosis not present

## 2020-06-13 DIAGNOSIS — E039 Hypothyroidism, unspecified: Secondary | ICD-10-CM | POA: Diagnosis not present

## 2020-06-13 DIAGNOSIS — N1831 Chronic kidney disease, stage 3a: Secondary | ICD-10-CM | POA: Diagnosis not present

## 2020-06-13 DIAGNOSIS — F2089 Other schizophrenia: Secondary | ICD-10-CM | POA: Diagnosis not present

## 2020-07-20 ENCOUNTER — Emergency Department (HOSPITAL_BASED_OUTPATIENT_CLINIC_OR_DEPARTMENT_OTHER): Payer: Medicare Other

## 2020-07-20 ENCOUNTER — Encounter (HOSPITAL_BASED_OUTPATIENT_CLINIC_OR_DEPARTMENT_OTHER): Payer: Self-pay | Admitting: *Deleted

## 2020-07-20 ENCOUNTER — Other Ambulatory Visit: Payer: Self-pay

## 2020-07-20 ENCOUNTER — Emergency Department (HOSPITAL_BASED_OUTPATIENT_CLINIC_OR_DEPARTMENT_OTHER)
Admission: EM | Admit: 2020-07-20 | Discharge: 2020-07-20 | Disposition: A | Discharge status: Home / Self Care | Payer: Medicare Other | Attending: Emergency Medicine | Admitting: Emergency Medicine

## 2020-07-20 DIAGNOSIS — Z79899 Other long term (current) drug therapy: Secondary | ICD-10-CM | POA: Diagnosis not present

## 2020-07-20 DIAGNOSIS — N3000 Acute cystitis without hematuria: Secondary | ICD-10-CM | POA: Diagnosis not present

## 2020-07-20 DIAGNOSIS — E039 Hypothyroidism, unspecified: Secondary | ICD-10-CM | POA: Insufficient documentation

## 2020-07-20 DIAGNOSIS — K5900 Constipation, unspecified: Secondary | ICD-10-CM | POA: Diagnosis not present

## 2020-07-20 DIAGNOSIS — R1033 Periumbilical pain: Secondary | ICD-10-CM | POA: Diagnosis present

## 2020-07-20 DIAGNOSIS — R109 Unspecified abdominal pain: Secondary | ICD-10-CM | POA: Diagnosis not present

## 2020-07-20 LAB — CBC WITH DIFFERENTIAL/PLATELET
Abs Immature Granulocytes: 0.02 10*3/uL (ref 0.00–0.07)
Basophils Absolute: 0 10*3/uL (ref 0.0–0.1)
Basophils Relative: 0 %
Eosinophils Absolute: 0 10*3/uL (ref 0.0–0.5)
Eosinophils Relative: 1 %
HCT: 36 % (ref 36.0–46.0)
Hemoglobin: 12.4 g/dL (ref 12.0–15.0)
Immature Granulocytes: 0 %
Lymphocytes Relative: 32 %
Lymphs Abs: 1.6 10*3/uL (ref 0.7–4.0)
MCH: 29.5 pg (ref 26.0–34.0)
MCHC: 34.4 g/dL (ref 30.0–36.0)
MCV: 85.5 fL (ref 80.0–100.0)
Monocytes Absolute: 0.4 10*3/uL (ref 0.1–1.0)
Monocytes Relative: 8 %
Neutro Abs: 3 10*3/uL (ref 1.7–7.7)
Neutrophils Relative %: 59 %
Platelets: 117 10*3/uL — ABNORMAL LOW (ref 150–400)
RBC: 4.21 MIL/uL (ref 3.87–5.11)
RDW: 13.2 % (ref 11.5–15.5)
Smear Review: NORMAL
WBC: 5.1 10*3/uL (ref 4.0–10.5)
nRBC: 0 % (ref 0.0–0.2)

## 2020-07-20 LAB — URINALYSIS, MICROSCOPIC (REFLEX)

## 2020-07-20 LAB — URINALYSIS, ROUTINE W REFLEX MICROSCOPIC
Bilirubin Urine: NEGATIVE
Glucose, UA: NEGATIVE mg/dL
Hgb urine dipstick: NEGATIVE
Ketones, ur: NEGATIVE mg/dL
Nitrite: NEGATIVE
Protein, ur: NEGATIVE mg/dL
Specific Gravity, Urine: <1.005 — ABNORMAL LOW (ref 1.005–1.030)
pH: 7 (ref 5.0–8.0)

## 2020-07-20 LAB — LIPASE, BLOOD: Lipase: 24 U/L (ref 11–51)

## 2020-07-20 LAB — COMPREHENSIVE METABOLIC PANEL
ALT: 25 U/L (ref 0–44)
AST: 24 U/L (ref 15–41)
Albumin: 3.8 g/dL (ref 3.5–5.0)
Alkaline Phosphatase: 119 U/L (ref 38–126)
Anion gap: 7 (ref 5–15)
BUN: 12 mg/dL (ref 6–20)
CO2: 29 mmol/L (ref 22–32)
Calcium: 9.4 mg/dL (ref 8.9–10.3)
Chloride: 101 mmol/L (ref 98–111)
Creatinine, Ser: 0.97 mg/dL (ref 0.44–1.00)
GFR, Estimated: >60 mL/min (ref >60)
Glucose, Bld: 98 mg/dL (ref 70–99)
Potassium: 4.1 mmol/L (ref 3.5–5.1)
Sodium: 137 mmol/L (ref 135–145)
Total Bilirubin: 0.2 mg/dL — ABNORMAL LOW (ref 0.3–1.2)
Total Protein: 7.2 g/dL (ref 6.5–8.1)

## 2020-07-20 LAB — PREGNANCY, URINE: Preg Test, Ur: NEGATIVE

## 2020-07-20 MED ORDER — CEPHALEXIN 250 MG PO CAPS
500.0000 mg | ORAL_CAPSULE | Freq: Once | ORAL | Status: AC
  Administered 2020-07-20: 500 mg via ORAL
  Filled 2020-07-20: qty 2

## 2020-07-20 MED ORDER — CEPHALEXIN 500 MG PO CAPS: 500.0000 mg | ORAL_CAPSULE | Freq: Two times a day (BID) | ORAL | 0 refills | Status: DC

## 2020-07-20 MED ORDER — IOHEXOL 300 MG/ML  SOLN
100.0000 mL | Freq: Once | INTRAMUSCULAR | Status: AC | PRN
  Administered 2020-07-20: 100 mL via INTRAVENOUS

## 2020-07-20 MED ORDER — NAPROXEN 500 MG PO TABS: 500.0000 mg | ORAL_TABLET | Freq: Two times a day (BID) | ORAL | 0 refills | Status: DC

## 2020-07-20 MED ORDER — ONDANSETRON 4 MG PO TBDP: 4.0000 mg | ORAL_TABLET | Freq: Three times a day (TID) | ORAL | 0 refills | Status: DC | PRN

## 2020-07-20 NOTE — ED Notes (Signed)
Pt. Is drinking grape juice at present time.

## 2020-07-20 NOTE — ED Notes (Signed)
Pt ambulated to restroom without assistance. Pt tolerated well.

## 2020-07-20 NOTE — Discharge Instructions (Addendum)
Take the antibiotics as prescribed.  Continue using the MiraLAX at home.  Return for new or worsening symptoms

## 2020-07-20 NOTE — ED Notes (Signed)
Patient transported to CT 

## 2020-07-20 NOTE — ED Notes (Signed)
Patient transported to X-ray 

## 2020-07-20 NOTE — ED Triage Notes (Signed)
Constipation. No relief with Murelax.

## 2020-07-20 NOTE — ED Provider Notes (Signed)
[facility] EMERGENCY DEPARTMENT Provider Note   CSN: [id number] Arrival date & time: 07/20/20  1633     History Chief Complaint  Patient presents with   Constipation    Sonya Moran is a 45 y.o. female with past medical history significant for schizoaffective disorder, currently has legal guardian who presents for evaluation abdominal pain.  Last bowel movement yesterday.  They have been using MiraLAX at home.  She is passing flatus.  She has periumbilical pain.  She is also having some dysuria and urinary frequency.  No fever, chills, nausea, vomiting, chest pain, shortness of breath, melena, bright red per rectum.  Denies additional aggravating or alleviating factors. Rates pain a 4/10.  History obtained from patient, guardian in room and past medical records.  No interpreter used.  HPI     Past Medical History:  Diagnosis Date   Depressive disorder    Hypothyroidism 06/16/2019   Mental retardation    Post traumatic stress disorder    Psychotic disorder [facility])     Patient Active Problem List   Diagnosis Date Noted   Acute encephalopathy 06/16/2019   Hypothyroidism 06/16/2019   AKI (acute kidney injury) ([place]) 06/16/2019   Schizoaffective disorder, bipolar type ([place]) 06/07/2019   Lithium toxicity 06/04/2019   Acute renal failure superimposed on stage 3 chronic kidney disease ([place]) 06/04/2019   Allergic rhinitis 03/01/2019   Mood disorder ([place]) 02/26/2018   Acute sinusitis 12/26/2017   Mixed rhinitis 08/01/2017   Ear pain, bilateral 08/01/2017   Eustachian tube dysfunction, bilateral 08/01/2017   Plantar fasciitis of right foot 03/28/2015   Metatarsal deformity 03/28/2015   Calcaneal spur 03/28/2015   Anemia 07/23/2013   Onychomycosis 06/24/2012    Past Surgical History:  Procedure Laterality Date   BACK SURGERY     WRIST SURGERY       OB History   No obstetric history on file.     No family history on file.  Social History   Tobacco Use    Smoking status: Never   Smokeless tobacco: Never  Vaping Use   Vaping Use: Never used  Substance Use Topics   Alcohol use: No    Alcohol/week: 0.0 standard drinks   Drug use: No    Home Medications Prior to Admission medications   Medication Sig Start Date End Date Taking? Authorizing Provider  cephALEXin (KEFLEX) 500 MG capsule Take 1 capsule (500 mg total) by mouth 2 (two) times daily. 07/20/20  Yes Naveena Eyman A, PA-C  naproxen (NAPROSYN) 500 MG tablet Take 1 tablet (500 mg total) by mouth 2 (two) times daily. 07/20/20  Yes Maleeyah Mccaughey A, PA-C  ondansetron (ZOFRAN ODT) 4 MG disintegrating tablet Take 1 tablet (4 mg total) by mouth every 8 (eight) hours as needed for nausea or vomiting. 07/20/20  Yes Daine Gunther A, PA-C  albuterol (VENTOLIN HFA) 108 (90 Base) MCG/ACT inhaler Inhale 2 puffs into the lungs every 6 (six) hours as needed for wheezing or shortness of breath. 06/22/19   Thurnell Lose, MD  amitriptyline (ELAVIL) 100 MG tablet Take 100 mg by mouth at bedtime.  06/11/18   [provider]  Azelastine HCl 0.15 % SOLN 2 SPRAYS EACH NOSTRIL TWICE DAILY. 12/13/19   Dara Hoyer, FNP  cetirizine (ZYRTEC) 10 MG tablet TAKE (1) TABLET BY MOUTH ONCE DAILY. 11/08/19   Dara Hoyer, FNP  dexlansoprazole (DEXILANT) 60 MG capsule Take 60 mg by mouth daily. Patient not taking: Reported on 10/25/2019  [provider]  fenofibrate (TRICOR) 145 MG tablet Take 145 mg by mouth at bedtime.     [provider]  fluticasone (FLONASE) 50 MCG/ACT nasal spray 2 SPRAYS INTO BOTH NOSTRILS ONCE A DAY AS NEEDED FOR STUFFY NOSE Patient taking differently: Place 2 sprays into both nostrils daily as needed for allergies or rhinitis. 2 SPRAYS INTO BOTH NOSTRILS ONCE A DAY AS NEEDED FOR STUFFY NOSE 10/09/18   Ambs, Kathrine Cords, FNP  lamoTRIgine (LAMICTAL) 200 MG tablet Take 200 mg by mouth daily.  06/16/18   [provider]  levothyroxine (SYNTHROID) 25 MCG tablet Take 25  mcg by mouth daily.  06/11/18   [provider]  LINZESS 145 MCG CAPS capsule Take 145 mcg by mouth daily. 02/08/19   [provider]  mirabegron ER (MYRBETRIQ) 25 MG TB24 tablet Take 25 mg by mouth daily.    [provider]  montelukast (SINGULAIR) 10 MG tablet TAKE ONE TABLET BY MOUTH EACH EVENING TO PREVENT COUGH OR WHEEZE Patient taking differently: Take 10 mg by mouth at bedtime.  02/07/17   Valentina Shaggy, MD  OLANZapine (ZYPREXA) 15 MG tablet Take 1 tablet (15 mg total) by mouth daily after breakfast. Patient taking differently: Take 15 mg by mouth 2 (two) times daily.  06/11/19   Mikhail, Velta Addison, DO  Olopatadine HCl (PATADAY) 0.2 % SOLN One drop each eye daily as needed 10/25/19   Ambs, Kathrine Cords, FNP  Olopatadine HCl 0.2 % SOLN Place 1 drop into both eyes daily as needed (for itching). Patient not taking: Reported on 10/25/2019    [provider]  phenol (CHLORASEPTIC) 1.4 % LIQD Use as directed 1 spray in the mouth or throat as needed for throat irritation / pain. 06/10/19   Mikhail, Velta Addison, DO  polyethylene glycol (MIRALAX) 17 g packet Take 17 g by mouth daily as needed (constipation). 02/06/20   Robinson, [place] N, PA-C  propranolol (INDERAL) 40 MG tablet Take 40 mg by mouth 2 (two) times daily. 02/15/19   [provider]  SALINE MIST 0.65 % nasal spray 2 SPRAYS EACH NOSTRIL TWICE DAILY. Patient taking differently: Place 2 sprays into the nose in the morning and at bedtime.  02/10/17   Valentina Shaggy, MD    Allergies    Grifulvin v [griseofulvin]  Review of Systems   Review of Systems  Constitutional: Negative.   HENT: Negative.    Respiratory: Negative.    Cardiovascular: Negative.   Gastrointestinal:  Positive for abdominal distention, abdominal pain and constipation. Negative for diarrhea, nausea, rectal pain and vomiting.  Genitourinary: Negative.   Musculoskeletal: Negative.   Skin: Negative.   Neurological: Negative.    All other systems reviewed and are negative.  Physical Exam Updated Vital Signs BP (!) 135/94 (BP Location: Left Arm)   Pulse 74   Temp 98.1 F (36.7 C) (Oral)   Resp 19   Ht 4\' 11"  (1.499 m)   Wt 83.8 kg   SpO2 95%   BMI 37.30 kg/m   Physical Exam Vitals and nursing note reviewed.  Constitutional:      General: She is not in acute distress.    Appearance: She is well-developed. She is not ill-appearing, toxic-appearing or diaphoretic.  HENT:     Head: Normocephalic and atraumatic.     Nose: Nose normal.     Mouth/Throat:     Mouth: Mucous membranes are moist.  Eyes:     Pupils: Pupils are equal, round, and reactive to  light.  Cardiovascular:     Rate and Rhythm: Normal rate.     Pulses: Normal pulses.     Heart sounds: Normal heart sounds.  Pulmonary:     Effort: Pulmonary effort is normal. No respiratory distress.     Breath sounds: Normal breath sounds.  Abdominal:     General: There is distension.     Palpations: Abdomen is soft.     Tenderness: There is abdominal tenderness.  Musculoskeletal:        General: Normal range of motion.     Cervical back: Normal range of motion.  Skin:    General: Skin is warm and dry.     Capillary Refill: Capillary refill takes less than 2 seconds.  Neurological:     General: No focal deficit present.     Mental Status: She is alert.  Psychiatric:        Mood and Affect: Mood normal.    ED Results / Procedures / Treatments   Labs (all labs ordered are listed, but only abnormal results are displayed) Labs Reviewed  URINALYSIS, ROUTINE W REFLEX MICROSCOPIC - Abnormal; Notable for the following components:      Result Value   Specific Gravity, Urine <1.005 (*)    Leukocytes,Ua MODERATE (*)    All other components within normal limits  URINALYSIS, MICROSCOPIC (REFLEX) - Abnormal; Notable for the following components:   Bacteria, UA FEW (*)    All other components within normal limits  CBC WITH DIFFERENTIAL/PLATELET -  Abnormal; Notable for the following components:   Platelets 117 (*)    All other components within normal limits  COMPREHENSIVE METABOLIC PANEL - Abnormal; Notable for the following components:   Total Bilirubin 0.2 (*)    All other components within normal limits  URINE CULTURE  LIPASE, BLOOD  PREGNANCY, URINE    EKG None  Radiology DG Abdomen 1 View  Result Date: 07/20/2020 CLINICAL DATA:  Constipation EXAM: ABDOMEN - 1 VIEW COMPARISON:  None. FINDINGS: Nonobstructive bowel gas pattern. Mildly increased left colonic stool burden. Visualized osseous structures are within normal limits. IMPRESSION: Mildly increased left colonic stool burden, compatible with the clinical history of constipation. Electronically Signed   By: Julian Hy M.D.   On: 07/20/2020 20:04   CT Abdomen Pelvis W Contrast  Result Date: 07/20/2020 CLINICAL DATA:  Acute periumbilical abdominal pain. EXAM: CT ABDOMEN AND PELVIS WITH CONTRAST TECHNIQUE: Multidetector CT imaging of the abdomen and pelvis was performed using the standard protocol following bolus administration of intravenous contrast. CONTRAST:  125mL OMNIPAQUE IOHEXOL 300 MG/ML  SOLN COMPARISON:  Abdominal radiograph 07/20/2020.  CT 02/06/2020 FINDINGS: Lower chest: Atelectasis in the lung bases. Hepatobiliary: Mild diffuse fatty infiltration of the liver. No focal lesions. Gallbladder and bile ducts are unremarkable. Pancreas: Unremarkable. No pancreatic ductal dilatation or surrounding inflammatory changes. Spleen: Subcentimeter low-attenuation lesion in the anterior spleen tip, likely cyst or hemangioma. Spleen is otherwise unremarkable. Adrenals/Urinary Tract: Adrenal glands are unremarkable. Kidneys are normal, without renal calculi, focal lesion, or hydronephrosis. Bladder is unremarkable. Stomach/Bowel: Stomach, small bowel, and colon are not abnormally distended. No wall thickening or inflammatory changes are appreciated. Appendix is normal.  Vascular/Lymphatic: No significant vascular findings are present. No enlarged abdominal or pelvic lymph nodes. Reproductive: No abnormal pelvic masses. Other: No free air or free fluid in the abdomen. Abdominal wall musculature appears intact. Musculoskeletal: No acute or significant osseous findings. IMPRESSION: No acute process demonstrated in the abdomen or pelvis. No evidence of bowel obstruction or  inflammation. Mild fatty infiltration of the liver. Electronically Signed   By: Lucienne Capers M.D.   On: 07/20/2020 21:19    Procedures Procedures   Medications Ordered in ED Medications  cephALEXin (KEFLEX) capsule 500 mg (has no administration in time range)  iohexol (OMNIPAQUE) 300 MG/ML solution 100 mL (100 mLs Intravenous Contrast Given 07/20/20 2112)    ED Course  I have reviewed the triage vital signs and the nursing notes.  Pertinent labs & imaging results that were available during my care of the patient were reviewed by me and considered in my medical decision making (see chart for details).  Here for evaluation of periumbilical pain as well as urinary complaints and constipation.  Last bowel movement yesterday.  She is passing flatus.  No emesis.  Her heart and lungs are clear.  Her abdomen has some mild tenderness.  No focal pain to right lower quadrant, left lower quadrant.  History of frequent UTI.  She is afebrile, nonseptic, not ill-appearing  Labs and imaging personally reviewed and interpreted:  CBC without leukocytosis Metabolic panel without electrolyte, renal abnormality Lipase 24 Pregnancy test negative UA positive for infection, will culture X-ray abdomen with possible constipation CT abdomen pelvis without acute abnormality  Patient reassessed.  Abdomen soft, nontender.  Given urinary complaints and urinalysis will treat for UTI.  She has no evidence of stool burden on exam.  Discussed continue with MiraLAX at home.  Patient is nontoxic, nonseptic appearing, in  no apparent distress.  Patient's pain and other symptoms adequately managed in emergency department.  Fluid bolus given.  Labs, imaging and vitals reviewed.  Patient does not meet the SIRS or Sepsis criteria.  On repeat exam patient does not have a surgical abdomin and there are no peritoneal signs.  No indication of appendicitis, bowel obstruction, bowel perforation, cholecystitis, diverticulitis, PID or ectopic pregnancy.    The patient has been appropriately medically screened and/or stabilized in the ED. I have low suspicion for any other emergent medical condition which would require further screening, evaluation or treatment in the ED or require inpatient management.  Patient is hemodynamically stable and in no acute distress.  Patient able to ambulate in department prior to ED.  Evaluation does not show acute pathology that would require ongoing or additional emergent interventions while in the emergency department or further inpatient treatment.  I have discussed the diagnosis with the patient and answered all questions.  Pain is been managed while in the emergency department and patient has no further complaints prior to discharge.  Patient is comfortable with plan discussed in room and is stable for discharge at this time.  I have discussed strict return precautions for returning to the emergency department.  Patient was encouraged to follow-up with PCP/specialist refer to at discharge.      MDM Rules/Calculators/A&P                           Final Clinical Impression(s) / ED Diagnoses Final diagnoses:  Acute cystitis without hematuria    Rx / DC Orders ED Discharge Orders          Ordered    cephALEXin (KEFLEX) 500 MG capsule  2 times daily        07/20/20 2222    ondansetron (ZOFRAN ODT) 4 MG disintegrating tablet  Every 8 hours PRN        07/20/20 2223    naproxen (NAPROSYN) 500 MG tablet  2 times daily  07/20/20 2223             Laiyla Slagel A, PA-C 07/20/20  2225    Davonna Belling, MD 07/20/20 2351

## 2020-07-22 LAB — URINE CULTURE

## 2020-08-01 DIAGNOSIS — N390 Urinary tract infection, site not specified: Secondary | ICD-10-CM | POA: Diagnosis not present

## 2020-08-01 DIAGNOSIS — L309 Dermatitis, unspecified: Secondary | ICD-10-CM | POA: Diagnosis not present

## 2020-08-01 DIAGNOSIS — K5900 Constipation, unspecified: Secondary | ICD-10-CM | POA: Diagnosis not present

## 2020-08-01 DIAGNOSIS — K219 Gastro-esophageal reflux disease without esophagitis: Secondary | ICD-10-CM | POA: Diagnosis not present

## 2020-08-02 DIAGNOSIS — Z20822 Contact with and (suspected) exposure to covid-19: Secondary | ICD-10-CM | POA: Diagnosis not present

## 2020-08-21 DIAGNOSIS — F251 Schizoaffective disorder, depressive type: Secondary | ICD-10-CM | POA: Diagnosis not present

## 2020-08-21 DIAGNOSIS — F609 Personality disorder, unspecified: Secondary | ICD-10-CM | POA: Diagnosis not present

## 2020-08-21 DIAGNOSIS — F70 Mild intellectual disabilities: Secondary | ICD-10-CM | POA: Diagnosis not present

## 2020-08-30 ENCOUNTER — Other Ambulatory Visit (HOSPITAL_COMMUNITY)
Admission: RE | Admit: 2020-08-30 | Discharge: 2020-08-30 | Disposition: A | Discharge status: Home / Self Care | Payer: Medicare Other | Source: Ambulatory Visit | Attending: Family Medicine | Admitting: Family Medicine

## 2020-08-30 ENCOUNTER — Other Ambulatory Visit: Payer: Self-pay | Admitting: Family Medicine

## 2020-08-30 DIAGNOSIS — N898 Other specified noninflammatory disorders of vagina: Secondary | ICD-10-CM | POA: Diagnosis not present

## 2020-08-30 DIAGNOSIS — K5900 Constipation, unspecified: Secondary | ICD-10-CM | POA: Diagnosis not present

## 2020-08-31 LAB — MOLECULAR ANCILLARY ONLY
Bacterial Vaginitis (gardnerella): NEGATIVE
Candida Glabrata: NEGATIVE
Candida Vaginitis: NEGATIVE
Chlamydia: NEGATIVE
Comment: NEGATIVE
Comment: NEGATIVE
Comment: NEGATIVE
Comment: NEGATIVE
Comment: NEGATIVE
Comment: NORMAL
Neisseria Gonorrhea: NEGATIVE
Trichomonas: NEGATIVE

## 2020-09-13 DIAGNOSIS — E039 Hypothyroidism, unspecified: Secondary | ICD-10-CM | POA: Diagnosis not present

## 2020-09-13 DIAGNOSIS — N1831 Chronic kidney disease, stage 3a: Secondary | ICD-10-CM | POA: Diagnosis not present

## 2020-09-13 DIAGNOSIS — F2089 Other schizophrenia: Secondary | ICD-10-CM | POA: Diagnosis not present

## 2020-10-06 ENCOUNTER — Other Ambulatory Visit: Payer: Self-pay

## 2020-10-06 ENCOUNTER — Encounter (HOSPITAL_COMMUNITY): Payer: Self-pay | Admitting: *Deleted

## 2020-10-06 ENCOUNTER — Emergency Department (HOSPITAL_COMMUNITY)
Admission: EM | Admit: 2020-10-06 | Discharge: 2020-10-07 | Disposition: A | Discharge status: Home / Self Care | Payer: Medicare Other | Attending: Emergency Medicine | Admitting: Emergency Medicine

## 2020-10-06 ENCOUNTER — Emergency Department (HOSPITAL_COMMUNITY): Payer: Medicare Other

## 2020-10-06 DIAGNOSIS — M1830 Unilateral post-traumatic osteoarthritis of first carpometacarpal joint, unspecified hand: Secondary | ICD-10-CM | POA: Insufficient documentation

## 2020-10-06 DIAGNOSIS — Z79899 Other long term (current) drug therapy: Secondary | ICD-10-CM | POA: Diagnosis not present

## 2020-10-06 DIAGNOSIS — R0902 Hypoxemia: Secondary | ICD-10-CM | POA: Diagnosis not present

## 2020-10-06 DIAGNOSIS — R1084 Generalized abdominal pain: Secondary | ICD-10-CM | POA: Insufficient documentation

## 2020-10-06 DIAGNOSIS — E039 Hypothyroidism, unspecified: Secondary | ICD-10-CM | POA: Insufficient documentation

## 2020-10-06 DIAGNOSIS — J029 Acute pharyngitis, unspecified: Secondary | ICD-10-CM | POA: Diagnosis not present

## 2020-10-06 DIAGNOSIS — I517 Cardiomegaly: Secondary | ICD-10-CM | POA: Diagnosis not present

## 2020-10-06 DIAGNOSIS — R059 Cough, unspecified: Secondary | ICD-10-CM | POA: Diagnosis not present

## 2020-10-06 DIAGNOSIS — R0602 Shortness of breath: Secondary | ICD-10-CM | POA: Insufficient documentation

## 2020-10-06 DIAGNOSIS — Z20822 Contact with and (suspected) exposure to covid-19: Secondary | ICD-10-CM | POA: Diagnosis not present

## 2020-10-06 DIAGNOSIS — R457 State of emotional shock and stress, unspecified: Secondary | ICD-10-CM | POA: Diagnosis not present

## 2020-10-06 DIAGNOSIS — J9811 Atelectasis: Secondary | ICD-10-CM | POA: Diagnosis not present

## 2020-10-06 LAB — CBC WITH DIFFERENTIAL/PLATELET
Abs Immature Granulocytes: 0.02 10*3/uL (ref 0.00–0.07)
Basophils Absolute: 0 10*3/uL (ref 0.0–0.1)
Basophils Relative: 0 %
Eosinophils Absolute: 0 10*3/uL (ref 0.0–0.5)
Eosinophils Relative: 0 %
HCT: 37.4 % (ref 36.0–46.0)
Hemoglobin: 12.4 g/dL (ref 12.0–15.0)
Immature Granulocytes: 0 %
Lymphocytes Relative: 30 %
Lymphs Abs: 1.8 10*3/uL (ref 0.7–4.0)
MCH: 28.9 pg (ref 26.0–34.0)
MCHC: 33.2 g/dL (ref 30.0–36.0)
MCV: 87.2 fL (ref 80.0–100.0)
Monocytes Absolute: 0.4 10*3/uL (ref 0.1–1.0)
Monocytes Relative: 7 %
Neutro Abs: 3.9 10*3/uL (ref 1.7–7.7)
Neutrophils Relative %: 63 %
Platelets: 115 10*3/uL — ABNORMAL LOW (ref 150–400)
RBC: 4.29 MIL/uL (ref 3.87–5.11)
RDW: 13.1 % (ref 11.5–15.5)
WBC: 6.1 10*3/uL (ref 4.0–10.5)
nRBC: 0 % (ref 0.0–0.2)

## 2020-10-06 LAB — PREGNANCY, URINE: Preg Test, Ur: NEGATIVE

## 2020-10-06 LAB — URINALYSIS, ROUTINE W REFLEX MICROSCOPIC
Bacteria, UA: NONE SEEN
Bilirubin Urine: NEGATIVE
Glucose, UA: NEGATIVE mg/dL
Hgb urine dipstick: NEGATIVE
Ketones, ur: NEGATIVE mg/dL
Nitrite: NEGATIVE
Protein, ur: NEGATIVE mg/dL
Specific Gravity, Urine: 1.002 — ABNORMAL LOW (ref 1.005–1.030)
pH: 6 (ref 5.0–8.0)

## 2020-10-06 LAB — RESP PANEL BY RT-PCR (FLU A&B, COVID) ARPGX2
Influenza A by PCR: NEGATIVE
Influenza B by PCR: NEGATIVE
SARS Coronavirus 2 by RT PCR: NEGATIVE

## 2020-10-06 LAB — GROUP A STREP BY PCR: Group A Strep by PCR: NOT DETECTED

## 2020-10-06 MED ORDER — SODIUM CHLORIDE 0.9 % IV BOLUS
1000.0000 mL | Freq: Once | INTRAVENOUS | Status: AC
  Administered 2020-10-06: 1000 mL via INTRAVENOUS

## 2020-10-06 NOTE — ED Provider Notes (Signed)
[facility] EMERGENCY DEPARTMENT Provider Note   CSN: [id number] Arrival date & time: 10/06/20  0175     History Chief Complaint  Patient presents with   Shortness of Breath    RENNEE COYNE is a 45 y.o. female.  HPI [facility] in [place].   Patient has a history of mental retardation.  She is able to answer questions however. She does not have any memory of choking earlier today however it seems that approximately 30 minutes prior to arrival in the ER her caregiver [name] states that she was complaining of a sore throat and seem to be at 1 point choking slightly.  She states that this episode lasted for several seconds but afterwards patient complained of sore throat  She do not seem to lose consciousness.  She did not have any drooling during this episode. She did not seem to be having difficulty breathing but told caregiver that she felt short of breath.  Caregiver called EMS who transported her to the ER.  Patient does not endorse any symptoms currently other than a sore throat and abdominal pain seems that her abdominal pain has been ongoing for 1 week caregiver states that she does not recall patient mentioning abdominal pain.  Per caregiver has not had any diarrhea or vomiting.  Patient denies any nausea  Level 5 caveat due to Mental retardation    Past Medical History:  Diagnosis Date   Depressive disorder    Hypothyroidism 06/16/2019   Mental retardation    Post traumatic stress disorder    Psychotic disorder [facility])     Patient Active Problem List   Diagnosis Date Noted   Acute encephalopathy 06/16/2019   Hypothyroidism 06/16/2019   AKI (acute kidney injury) ([place]) 06/16/2019   Schizoaffective disorder, bipolar type ([place]) 06/07/2019   Lithium toxicity 06/04/2019   Acute renal failure superimposed on stage 3 chronic kidney disease ([place]) 06/04/2019   Allergic rhinitis 03/01/2019   Mood disorder ([place]) 02/26/2018    Acute sinusitis 12/26/2017   Mixed rhinitis 08/01/2017   Ear pain, bilateral 08/01/2017   Eustachian tube dysfunction, bilateral 08/01/2017   Plantar fasciitis of right foot 03/28/2015   Metatarsal deformity 03/28/2015   Calcaneal spur 03/28/2015   Anemia 07/23/2013   Onychomycosis 06/24/2012    Past Surgical History:  Procedure Laterality Date   BACK SURGERY     WRIST SURGERY       OB History   No obstetric history on file.     No family history on file.  Social History   Tobacco Use   Smoking status: Never   Smokeless tobacco: Never  Vaping Use   Vaping Use: Never used  Substance Use Topics   Alcohol use: No    Alcohol/week: 0.0 standard drinks   Drug use: No    Home Medications Prior to Admission medications   Medication Sig Start Date End Date Taking? Authorizing Provider  albuterol (VENTOLIN HFA) 108 (90 Base) MCG/ACT inhaler Inhale 2 puffs into the lungs every 6 (six) hours as needed for wheezing or shortness of breath. 06/22/19   Thurnell Lose, MD  amitriptyline (ELAVIL) 100 MG tablet Take 100 mg by mouth at bedtime.  06/11/18   [provider]  Azelastine HCl 0.15 % SOLN 2 SPRAYS EACH NOSTRIL TWICE DAILY. 12/13/19   Ambs, Kathrine Cords, FNP  cephALEXin (KEFLEX) 500 MG capsule Take 1 capsule (500 mg total) by mouth 2 (two) times daily. 07/20/20  Henderly, Britni A, PA-C  cetirizine (ZYRTEC) 10 MG tablet TAKE (1) TABLET BY MOUTH ONCE DAILY. 11/08/19   Dara Hoyer, FNP  dexlansoprazole (DEXILANT) 60 MG capsule Take 60 mg by mouth daily. Patient not taking: Reported on 10/25/2019    [provider]  fenofibrate (TRICOR) 145 MG tablet Take 145 mg by mouth at bedtime.     [provider]  fluticasone (FLONASE) 50 MCG/ACT nasal spray 2 SPRAYS INTO BOTH NOSTRILS ONCE A DAY AS NEEDED FOR STUFFY NOSE Patient taking differently: Place 2 sprays into both nostrils daily as needed for allergies or rhinitis. 2 SPRAYS INTO BOTH NOSTRILS ONCE A DAY AS  NEEDED FOR STUFFY NOSE 10/09/18   Ambs, Kathrine Cords, FNP  lamoTRIgine (LAMICTAL) 200 MG tablet Take 200 mg by mouth daily.  06/16/18   [provider]  levothyroxine (SYNTHROID) 25 MCG tablet Take 25 mcg by mouth daily.  06/11/18   [provider]  LINZESS 145 MCG CAPS capsule Take 145 mcg by mouth daily. 02/08/19   [provider]  mirabegron ER (MYRBETRIQ) 25 MG TB24 tablet Take 25 mg by mouth daily.    [provider]  montelukast (SINGULAIR) 10 MG tablet TAKE ONE TABLET BY MOUTH EACH EVENING TO PREVENT COUGH OR WHEEZE Patient taking differently: Take 10 mg by mouth at bedtime.  02/07/17   Valentina Shaggy, MD  naproxen (NAPROSYN) 500 MG tablet Take 1 tablet (500 mg total) by mouth 2 (two) times daily. 07/20/20   Henderly, Britni A, PA-C  OLANZapine (ZYPREXA) 15 MG tablet Take 1 tablet (15 mg total) by mouth daily after breakfast. Patient taking differently: Take 15 mg by mouth 2 (two) times daily.  06/11/19   Mikhail, Velta Addison, DO  Olopatadine HCl (PATADAY) 0.2 % SOLN One drop each eye daily as needed 10/25/19   Ambs, Kathrine Cords, FNP  Olopatadine HCl 0.2 % SOLN Place 1 drop into both eyes daily as needed (for itching). Patient not taking: Reported on 10/25/2019    [provider]  ondansetron (ZOFRAN ODT) 4 MG disintegrating tablet Take 1 tablet (4 mg total) by mouth every 8 (eight) hours as needed for nausea or vomiting. 07/20/20   Henderly, Britni A, PA-C  phenol (CHLORASEPTIC) 1.4 % LIQD Use as directed 1 spray in the mouth or throat as needed for throat irritation / pain. 06/10/19   Mikhail, Velta Addison, DO  polyethylene glycol (MIRALAX) 17 g packet Take 17 g by mouth daily as needed (constipation). 02/06/20   Robinson, [place] N, PA-C  propranolol (INDERAL) 40 MG tablet Take 40 mg by mouth 2 (two) times daily. 02/15/19   [provider]  SALINE MIST 0.65 % nasal spray 2 SPRAYS EACH NOSTRIL TWICE DAILY. Patient taking differently: Place 2 sprays into the nose in  the morning and at bedtime.  02/10/17   Valentina Shaggy, MD    Allergies    Grifulvin v [griseofulvin]  Review of Systems   Review of Systems  Reason unable to perform ROS: MR.  Abdominal pain, sore throat  Physical Exam Updated Vital Signs BP (!) 120/94   Pulse 86   Temp 97.8 F (36.6 C)   Resp (!) 21   Ht 4\' 11"  (1.499 m)   Wt 83.8 kg   SpO2 95%   BMI 37.31 kg/m   Physical Exam Vitals and nursing note reviewed.  Constitutional:      General: She is not in acute distress.    Appearance: She is obese.  Comments: Pleasant well-appearing 45 year old female does not appear to be in any acute distress speaking in full sentences albeit short sentences does not seem to be dyspneic or tachypneic.  HENT:     Head: Normocephalic and atraumatic.     Nose: Nose normal.  Eyes:     General: No scleral icterus. Cardiovascular:     Rate and Rhythm: Normal rate and regular rhythm.     Pulses: Normal pulses.     Heart sounds: Normal heart sounds.  Pulmonary:     Effort: Pulmonary effort is normal. No respiratory distress.     Breath sounds: Normal breath sounds. No wheezing.     Comments: Lungs CAB Abdominal:     Palpations: Abdomen is soft.     Tenderness: There is no abdominal tenderness.  Musculoskeletal:     Cervical back: Normal range of motion.     Right lower leg: No edema.     Left lower leg: No edema.  Skin:    General: Skin is warm and dry.     Capillary Refill: Capillary refill takes less than 2 seconds.  Neurological:     Mental Status: She is alert. Mental status is at baseline.  Psychiatric:        Mood and Affect: Mood normal.        Behavior: Behavior normal.    ED Results / Procedures / Treatments   Labs (all labs ordered are listed, but only abnormal results are displayed) Labs Reviewed  CBC WITH DIFFERENTIAL/PLATELET - Abnormal; Notable for the following components:      Result Value   Platelets 115 (*)    All other components within normal  limits  URINALYSIS, ROUTINE W REFLEX MICROSCOPIC - Abnormal; Notable for the following components:   Color, Urine COLORLESS (*)    Specific Gravity, Urine 1.002 (*)    Leukocytes,Ua TRACE (*)    Non Squamous Epithelial 0-5 (*)    All other components within normal limits  GROUP A STREP BY PCR  RESP PANEL BY RT-PCR (FLU A&B, COVID) [id number]  COMPREHENSIVE METABOLIC PANEL  LIPASE, BLOOD  PREGNANCY, URINE    EKG None  Radiology DG Chest 2 View  Result Date: 10/06/2020 CLINICAL DATA:  Shortness of breath EXAM: CHEST - 2 VIEW COMPARISON:  06/21/2019 FINDINGS: Borderline cardiomegaly. Hazy bilateral perihilar edema or infiltrates. No pleural effusion or pneumothorax. IMPRESSION: Borderline cardiomegaly with vascular congestion. Perihilar haziness could be secondary to mild edema or infection. Patchy atelectasis bases. Electronically Signed   By: Donavan Foil M.D.   On: 10/06/2020 20:30    Procedures Procedures   Medications Ordered in ED Medications  sodium chloride 0.9 % bolus 1,000 mL (1,000 mLs Intravenous New Bag/Given 10/06/20 2142)    ED Course  I have reviewed the triage vital signs and the nursing notes.  Pertinent labs & imaging results that were available during my care of the patient were reviewed by me and considered in my medical decision making (see chart for details).  Clinical Course as of 10/06/20 2355  Ludwig Clarks Oct 06, 2020  2037 Urinalysis, Routine w reflex microscopic(!) Does not appear to be infected.  [WF]  2039 Chest x-ray personally reviewed small lung volumes likely due to poor inspiratory effort as patient has intellectual delay and timing is difficult.  In the setting of this I am quite reassured by the lack of any focal infiltrate.  Radiologist reviewed below.  IMPRESSION: Borderline cardiomegaly with vascular congestion. Perihilar haziness could be secondary to mild edema  or infection. Patchy atelectasis bases. [WF]  2323 COVID influenza negative.  Strep  negative.  CBC without leukocytosis or anemia.  Urinalysis unremarkable. [WF]  2323 I have discussed of lab twice with patient's CMP and lipase are still pending the assures me that these results will be available soon.  These discussions occurred over the past 2 hours. [WF]  2330 I had a thorough discussion with laboratory staff who informing that patient's lab work was lost.  They are uncertain where these labs have gone.  Although CBC was obtained and resulted it seems that the rest of her labs were lost.  I had a lengthy shared decision-making conversation with patient and caregiver at bedside.  Given the patient complains of no symptoms at this time continues to have a soft nontender abdomen and is well-appearing will discharge home with follow-up with PCP return precautions were given.  I also offered redraw of labs and recommended this however caregiver and patient would prefer to be discharged home.  I discussed with him that I think this is also a reasonable approach however they should return for any new or concerning symptoms. [WF]    Clinical Course User Index [WF] Tedd Sias, PA   MDM Rules/Calculators/A&P                           I discussed this case with my attending physician who cosigned this note including patient's presenting symptoms, physical exam, and planned diagnostics and interventions. Attending physician stated agreement with plan or made changes to plan which were implemented.   Patient is well-appearing 45 year old female does have a history of mental retardation she is here in the ER today because she had a episode where she had a sore throat and then described shortness of breath to her caregiver.  Here in the ER she does not describe any chest pain or shortness of breath.  She states that she has a sore throat.  Urine pregnancy negative  Abdominal exam is benign somewhat protuberant abdomen but soft and nontender.  No guarding or rebound.  Patient has been  here in the ER for 5 hours we have had issues with labs including them informing us that labs were stuck in the machine and were delayed for this reason.  I then again discussed with laboratory who informed me that they had lost labs.  After this change events patient and caregiver have decided like to be discharged home I think this is reasonable and patient is very well-appearing.  Strict return precautions were given and close follow-up recommended.  Patient is tolerating p.o. well-appearing does not have any complaints at this time.  Final Clinical Impression(s) / ED Diagnoses Final diagnoses:  Generalized abdominal pain  Cough    Rx / DC Orders ED Discharge Orders     None        Tedd Sias, [place] 10/06/20 2357    Arnaldo Natal, MD 10/07/20 1555

## 2020-10-06 NOTE — ED Triage Notes (Signed)
The pt [facility] from [facility]   care giver  [name]  [phone] the pt is mentally chalanged  the pt was eating and someone thought she  became choked while eating  she arrived c/o throat pain

## 2020-10-06 NOTE — Discharge Instructions (Addendum)
Your COVID and influenza test were negative.  Your strep test was negative.  Urinalysis was unremarkable and the lab work that was obtained today was reassuring. I would recommend follow-up with your primary care provider.  Given that we were not able to get a CMP or complete metabolic panel today I would recommend discussing whether or not to obtain this when you follow-up with your PCP.

## 2020-10-06 NOTE — ED Notes (Signed)
Pt returned from xray

## 2020-10-07 DIAGNOSIS — R0602 Shortness of breath: Secondary | ICD-10-CM | POA: Diagnosis not present

## 2020-10-09 ENCOUNTER — Other Ambulatory Visit: Payer: Self-pay | Admitting: Family Medicine

## 2020-10-13 DIAGNOSIS — E039 Hypothyroidism, unspecified: Secondary | ICD-10-CM | POA: Diagnosis not present

## 2020-10-13 DIAGNOSIS — F2089 Other schizophrenia: Secondary | ICD-10-CM | POA: Diagnosis not present

## 2020-10-13 DIAGNOSIS — N1831 Chronic kidney disease, stage 3a: Secondary | ICD-10-CM | POA: Diagnosis not present

## 2020-10-16 DIAGNOSIS — H2513 Age-related nuclear cataract, bilateral: Secondary | ICD-10-CM | POA: Diagnosis not present

## 2020-10-19 DIAGNOSIS — L309 Dermatitis, unspecified: Secondary | ICD-10-CM | POA: Diagnosis not present

## 2020-10-24 NOTE — Patient Instructions (Addendum)
Mixed rhinitis Continue cetirizine 10 mg once a day as needed for runny nose Continue the nasal Flonase 2 sprays in each nostril once a day for a stuffy nose. Right nostril point the applicator toward the right ear. Left nostril point the applicator toward the left ear Continue montelukast 10 mg once a day Continue azelastine nasal spray 2 sprays twice a day as needed for a runny nose or sneezing Continue saline nasal rinses as needed for nasal symptoms. Use this before any medicated nasal sprays for best result  Cough Stable Continue the treatment plan as listed above Continue albuterol once every 4 hours as needed with a spacer for cough or wheeze  Sleep apnea Recommend evaluation and management of CPAP use and CPAP materials  Allergic conjunctivitis Continue Pataday (olopatadine 0.2%) eye drops one drop in each eye once a day as needed for red, itchy eyes  Reflux Continue dietary and lifestyle modifications as listed below Continue Dexilant 60 mg once a day as previously prescribed  Call me if this treatment plan is not working well for you  Follow up in 6 months or sooner if needed

## 2020-10-24 NOTE — Progress Notes (Signed)
[address] [place]  [address] Dept: [phone]  FOLLOW UP NOTE  Patient ID: [name], female    DOB: 05/19/1975  Age: 45 y.o. MRN: [id number] Date of Office Visit: 10/25/2020  Assessment  Chief Complaint: Cough and Allergic Rhinitis   HPI Sonya Moran is a 45 year old female who presents the clinic for follow-up visit.  She was last seen in this clinic on 10/25/2019 for evaluation of allergic and nonallergic rhinitis, allergic conjunctivitis, and cough.  She is accompanied by her caregiver who assists with history.  At today's visit, she reports her rhinitis has been moderately well controlled with symptoms including intermittent nasal congestion, occasional sneezing, and postnasal drainage with frequent throat clearing.  She continues montelukast 10 mg once a day, cetirizine 10 mg once a day, azelastine 2 sprays in each nostril twice a day, saline rinses twice a day, and Flonase as needed.  Allergic conjunctivitis is reported as moderately well controlled with occasional red and itchy eyes for which she uses olopatadine with relief of symptoms.  Cough is reported as well controlled with infrequent coughing episodes.  She has not used albuterol for the last several months.  She denies shortness of breath and wheeze.  Reflux is reported as poorly controlled over the last 2 weeks with frequent heartburn while she has been out of Dexilant.  Otherwise, she reports reflux has been well controlled with Dexilant 60 mg once a day.  Shante's caregiver reports that Katonya continues to use a CPAP at night, however, the mask is no longer well fitted to her face. Her current medications are listed in the chart.   Drug Allergies:  Allergies  Allergen Reactions   Grifulvin V [Griseofulvin] Anaphylaxis and Other (See Comments)    Arm swelling    Physical Exam: BP 130/86   Pulse 80   Temp 98.7 F (37.1 C)   Resp 16   Ht 4\' 11"  (1.499 m)   Wt 186 lb (84.4 kg)   SpO2 96%   BMI 37.57 kg/m     Physical Exam Vitals reviewed.  Constitutional:      Appearance: Normal appearance.  HENT:     Head: Normocephalic and atraumatic.     Right Ear: Tympanic membrane normal.     Left Ear: Tympanic membrane normal.     Nose:     Comments: Bilateral nares slightly erythematous with clear nasal drainage noted.  Pharynx normal.  Ears normal.  Eyes normal.    Mouth/Throat:     Pharynx: Oropharynx is clear.  Eyes:     Conjunctiva/sclera: Conjunctivae normal.  Cardiovascular:     Rate and Rhythm: Normal rate and regular rhythm.     Heart sounds: Normal heart sounds. No murmur heard. Pulmonary:     Effort: Pulmonary effort is normal.     Breath sounds: Normal breath sounds.     Comments: Lungs clear to auscultation Musculoskeletal:        General: Normal range of motion.     Cervical back: Normal range of motion and neck supple.  Skin:    General: Skin is warm and dry.  Neurological:     Mental Status: She is alert and oriented to person, place, and time.  Psychiatric:        Mood and Affect: Mood normal.        Behavior: Behavior normal.        Thought Content: Thought content normal.        Judgment: Judgment normal.  Assessment and Plan: 1. Mixed rhinitis   2. Chronic cough   3. Seasonal allergic conjunctivitis   4. Gastroesophageal reflux disease, unspecified whether esophagitis present     Meds ordered this encounter  Medications   dexlansoprazole (DEXILANT) 60 MG capsule    Sig: Take 1 capsule (60 mg total) by mouth daily.    Dispense:  30 capsule    Refill:  0     Patient Instructions  Mixed rhinitis Continue cetirizine 10 mg once a day as needed for runny nose Continue the nasal Flonase 2 sprays in each nostril once a day for a stuffy nose. Right nostril point the applicator toward the right ear. Left nostril point the applicator toward the left ear Continue montelukast 10 mg once a day Continue azelastine nasal spray 2 sprays twice a day as needed for a  runny nose or sneezing Continue saline nasal rinses as needed for nasal symptoms. Use this before any medicated nasal sprays for best result  Cough Stable Continue the treatment plan as listed above Continue albuterol once every 4 hours as needed with a spacer for cough or wheeze  Sleep apnea Recommend evaluation and management of CPAP use and CPAP materials  Allergic conjunctivitis Continue Pataday (olopatadine 0.2%) eye drops one drop in each eye once a day as needed for red, itchy eyes  Reflux Continue dietary and lifestyle modifications as listed below Continue Dexilant 60 mg once a day as previously prescribed  Call me if this treatment plan is not working well for you  Follow up in 6 months or sooner if needed  No follow-ups on file.    Thank you for the opportunity to care for this patient.  Please do not hesitate to contact me with questions.  [name], FNP [facility]

## 2020-10-25 ENCOUNTER — Other Ambulatory Visit: Payer: Self-pay

## 2020-10-25 ENCOUNTER — Ambulatory Visit (INDEPENDENT_AMBULATORY_CARE_PROVIDER_SITE_OTHER): Payer: Medicare Other | Admitting: Family Medicine

## 2020-10-25 ENCOUNTER — Encounter: Payer: Self-pay | Admitting: Family Medicine

## 2020-10-25 VITALS — BP 130/86 | HR 80 | Temp 98.7°F | Resp 16 | Ht 59.0 in | Wt 186.0 lb

## 2020-10-25 DIAGNOSIS — K219 Gastro-esophageal reflux disease without esophagitis: Secondary | ICD-10-CM | POA: Insufficient documentation

## 2020-10-25 DIAGNOSIS — R053 Chronic cough: Secondary | ICD-10-CM | POA: Diagnosis not present

## 2020-10-25 DIAGNOSIS — H1010 Acute atopic conjunctivitis, unspecified eye: Secondary | ICD-10-CM

## 2020-10-25 DIAGNOSIS — J310 Chronic rhinitis: Secondary | ICD-10-CM

## 2020-10-25 MED ORDER — DEXLANSOPRAZOLE 60 MG PO CPDR: 60.0000 mg | DELAYED_RELEASE_CAPSULE | Freq: Every day | ORAL | 0 refills | Status: DC

## 2020-11-01 DIAGNOSIS — E1169 Type 2 diabetes mellitus with other specified complication: Secondary | ICD-10-CM | POA: Diagnosis not present

## 2020-11-01 DIAGNOSIS — I10 Essential (primary) hypertension: Secondary | ICD-10-CM | POA: Diagnosis not present

## 2020-11-01 DIAGNOSIS — G4733 Obstructive sleep apnea (adult) (pediatric): Secondary | ICD-10-CM | POA: Diagnosis not present

## 2020-11-06 ENCOUNTER — Ambulatory Visit (INDEPENDENT_AMBULATORY_CARE_PROVIDER_SITE_OTHER): Payer: Medicare Other | Admitting: Obstetrics and Gynecology

## 2020-11-06 ENCOUNTER — Other Ambulatory Visit: Payer: Self-pay

## 2020-11-06 ENCOUNTER — Encounter: Payer: Self-pay | Admitting: Obstetrics and Gynecology

## 2020-11-06 ENCOUNTER — Other Ambulatory Visit: Payer: Self-pay | Admitting: Family Medicine

## 2020-11-06 ENCOUNTER — Other Ambulatory Visit (HOSPITAL_COMMUNITY)
Admission: RE | Admit: 2020-11-06 | Discharge: 2020-11-06 | Disposition: A | Discharge status: Home / Self Care | Payer: Medicare Other | Source: Ambulatory Visit | Attending: Obstetrics and Gynecology | Admitting: Obstetrics and Gynecology

## 2020-11-06 VITALS — BP 125/80 | HR 70 | Ht 59.0 in | Wt 185.0 lb

## 2020-11-06 DIAGNOSIS — Z1151 Encounter for screening for human papillomavirus (HPV): Secondary | ICD-10-CM | POA: Insufficient documentation

## 2020-11-06 DIAGNOSIS — Z01419 Encounter for gynecological examination (general) (routine) without abnormal findings: Secondary | ICD-10-CM | POA: Insufficient documentation

## 2020-11-06 DIAGNOSIS — Z1231 Encounter for screening mammogram for malignant neoplasm of breast: Secondary | ICD-10-CM

## 2020-11-06 DIAGNOSIS — Z124 Encounter for screening for malignant neoplasm of cervix: Secondary | ICD-10-CM | POA: Diagnosis not present

## 2020-11-06 DIAGNOSIS — N760 Acute vaginitis: Secondary | ICD-10-CM

## 2020-11-06 MED ORDER — NYSTATIN-TRIAMCINOLONE 100000-0.1 UNIT/GM-% EX OINT: 1.0000 "application " | TOPICAL_OINTMENT | Freq: Two times a day (BID) | CUTANEOUS | 3 refills | Status: DC

## 2020-11-06 NOTE — Progress Notes (Signed)
Pt stopped taking Depo Provera. Caregiver is unsure when she stopped but thinks it might've been 2019. Pt has not had period since.   Last mammogram and pap smear in either 2018 or 2019 with normal results per caregiver
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
## 2020-11-07 LAB — CERVICOVAGINAL ANCILLARY ONLY
Bacterial Vaginitis (gardnerella): NEGATIVE
Candida Glabrata: NEGATIVE
Candida Vaginitis: NEGATIVE
Chlamydia: NEGATIVE
Comment: NEGATIVE
Comment: NEGATIVE
Comment: NEGATIVE
Comment: NEGATIVE
Comment: NEGATIVE
Comment: NORMAL
Neisseria Gonorrhea: NEGATIVE
Trichomonas: NEGATIVE

## 2020-11-10 LAB — CYTOLOGY - PAP
Comment: NEGATIVE
Diagnosis: UNDETERMINED — AB
High risk HPV: NEGATIVE

## 2020-11-15 ENCOUNTER — Other Ambulatory Visit: Payer: Self-pay | Admitting: Family Medicine

## 2020-11-20 DIAGNOSIS — F70 Mild intellectual disabilities: Secondary | ICD-10-CM | POA: Diagnosis not present

## 2020-11-20 DIAGNOSIS — F251 Schizoaffective disorder, depressive type: Secondary | ICD-10-CM | POA: Diagnosis not present

## 2020-11-20 DIAGNOSIS — F609 Personality disorder, unspecified: Secondary | ICD-10-CM | POA: Diagnosis not present

## 2020-11-23 ENCOUNTER — Other Ambulatory Visit: Payer: Self-pay | Admitting: Obstetrics and Gynecology

## 2020-11-23 DIAGNOSIS — Z1231 Encounter for screening mammogram for malignant neoplasm of breast: Secondary | ICD-10-CM

## 2020-12-05 ENCOUNTER — Other Ambulatory Visit: Payer: Self-pay | Admitting: Family Medicine

## 2020-12-05 MED ORDER — FLUTICASONE PROPIONATE 50 MCG/ACT NA SUSP: 2.0000 | Freq: Every day | NASAL | 5 refills | Status: DC

## 2020-12-05 NOTE — Telephone Encounter (Signed)
Called and spoke to patient and informed her that her refills have been sent and scheduled her for her follow up appointment in February. Patient also asked for Flonase to be sent as well. Wlil  Send in a refill for that as well.

## 2020-12-05 NOTE — Addendum Note (Signed)
Addended by: Clovis Cao A on: 12/05/2020 06:05 PM   Modules accepted: Orders

## 2020-12-13 DIAGNOSIS — F2089 Other schizophrenia: Secondary | ICD-10-CM | POA: Diagnosis not present

## 2020-12-13 DIAGNOSIS — N1831 Chronic kidney disease, stage 3a: Secondary | ICD-10-CM | POA: Diagnosis not present

## 2020-12-13 DIAGNOSIS — E039 Hypothyroidism, unspecified: Secondary | ICD-10-CM | POA: Diagnosis not present

## 2020-12-19 DIAGNOSIS — Z20822 Contact with and (suspected) exposure to covid-19: Secondary | ICD-10-CM | POA: Diagnosis not present

## 2021-01-02 ENCOUNTER — Ambulatory Visit
Admission: RE | Admit: 2021-01-02 | Discharge: 2021-01-02 | Disposition: A | Discharge status: Home / Self Care | Payer: Medicare Other | Source: Ambulatory Visit | Attending: Obstetrics and Gynecology | Admitting: Obstetrics and Gynecology

## 2021-01-02 DIAGNOSIS — Z1231 Encounter for screening mammogram for malignant neoplasm of breast: Secondary | ICD-10-CM | POA: Diagnosis not present

## 2021-01-22 DIAGNOSIS — L309 Dermatitis, unspecified: Secondary | ICD-10-CM | POA: Diagnosis not present

## 2021-01-26 ENCOUNTER — Encounter (HOSPITAL_BASED_OUTPATIENT_CLINIC_OR_DEPARTMENT_OTHER): Payer: Self-pay

## 2021-01-26 DIAGNOSIS — G4733 Obstructive sleep apnea (adult) (pediatric): Secondary | ICD-10-CM

## 2021-02-11 DIAGNOSIS — F2089 Other schizophrenia: Secondary | ICD-10-CM | POA: Diagnosis not present

## 2021-02-11 DIAGNOSIS — N1831 Chronic kidney disease, stage 3a: Secondary | ICD-10-CM | POA: Diagnosis not present

## 2021-02-11 DIAGNOSIS — E039 Hypothyroidism, unspecified: Secondary | ICD-10-CM | POA: Diagnosis not present

## 2021-02-11 IMAGING — CT CT CERVICAL SPINE W/O CM
3 of 4 series · 13 of 33 positions shown, 16 images · non-contrast
Comparison: 06/04/2019

CLINICAL DATA: Altered mental status and recent falls

EXAM:
CT HEAD WITHOUT CONTRAST
CT CERVICAL SPINE WITHOUT CONTRAST
TECHNIQUE: Multidetector CT imaging of the head and cervical spine was
performed following the standard protocol without intravenous
contrast. Multiplanar CT image reconstructions of the cervical spine
were also generated.

[Series 3: c_spine 2.0 st · axial · 0.32mm/px · z∈[+1241,+1343]mm · 5 of 77 slices shown, 7 images]
[im 13/77  soft-tissue]
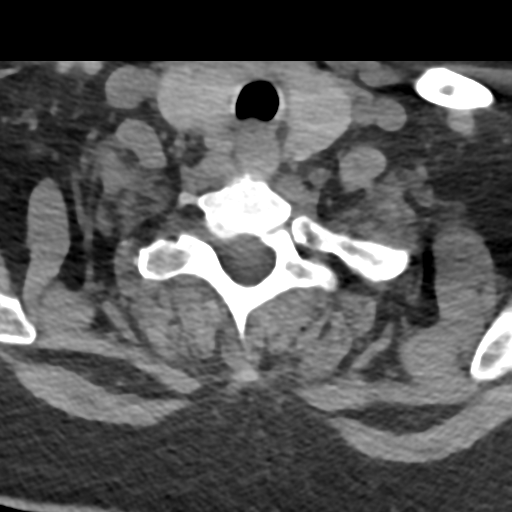
[im 13/77  bone]
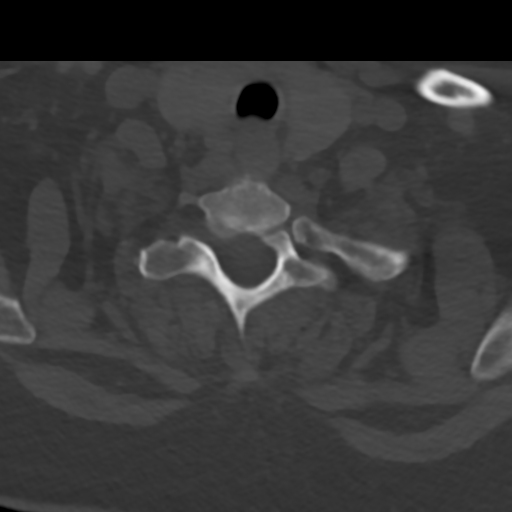
[im 26/77  bone]
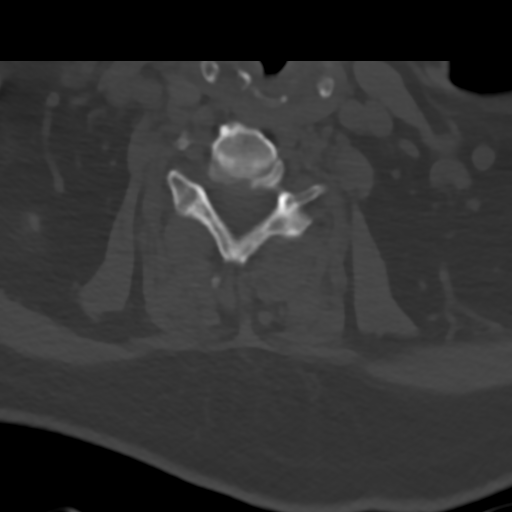
[im 39/77  bone]
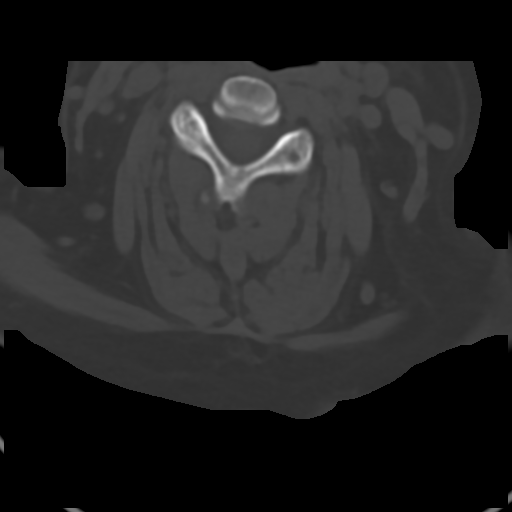
[im 51/77  bone]
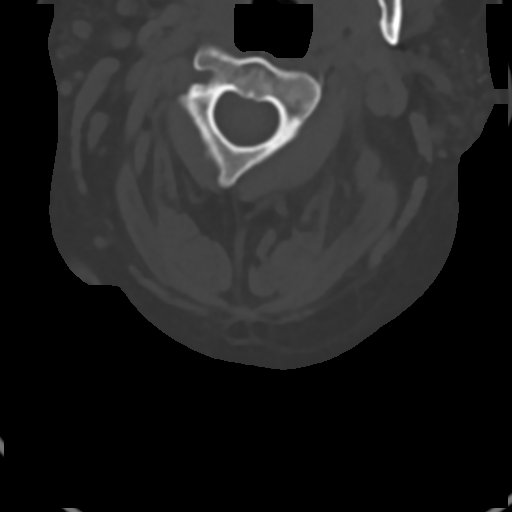
[im 64/77  soft-tissue]
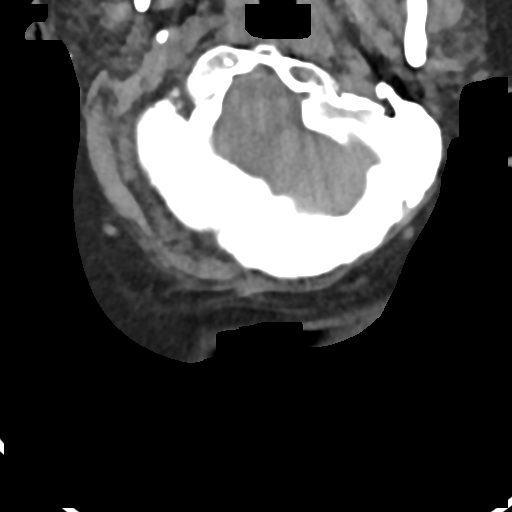
[im 64/77  bone]
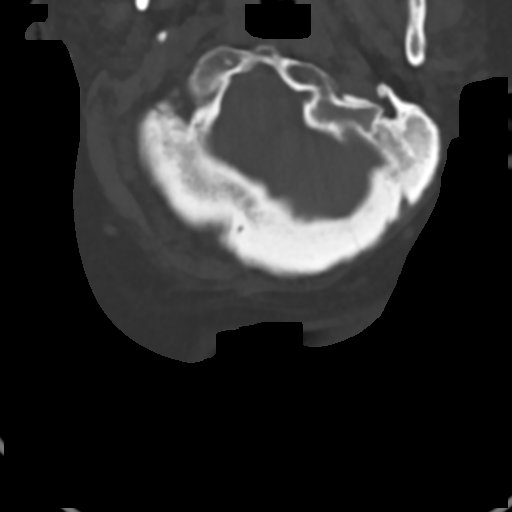

[Series 7: c_spine 2.0 sag bone · sagittal · 0.25mm/px · 5 of 61 slices shown, 6 images]
[im 21/61  bone]
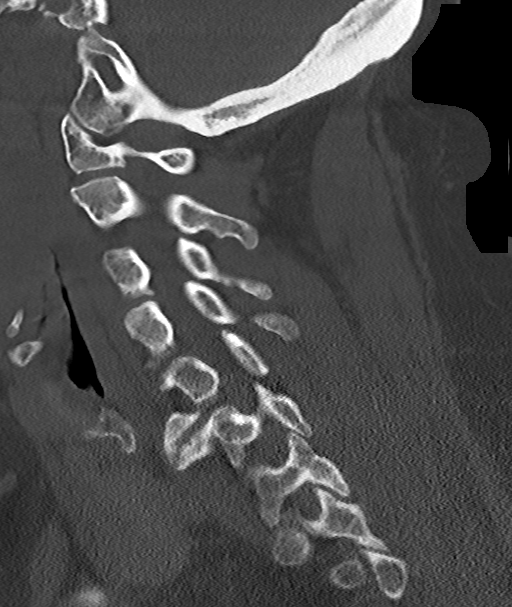
[im 26/61  bone]
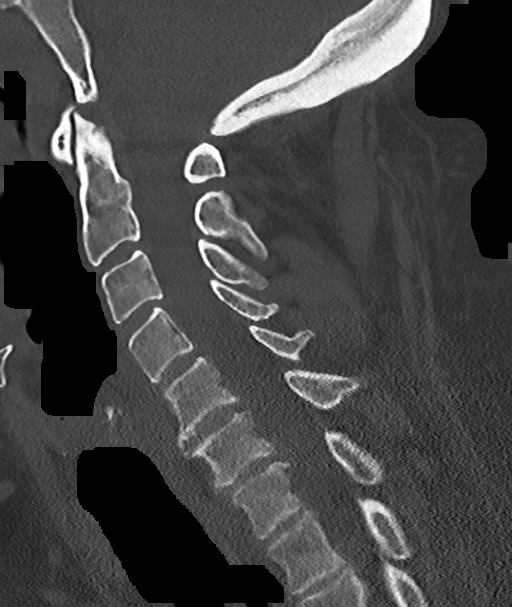
[im 31/61  soft-tissue]
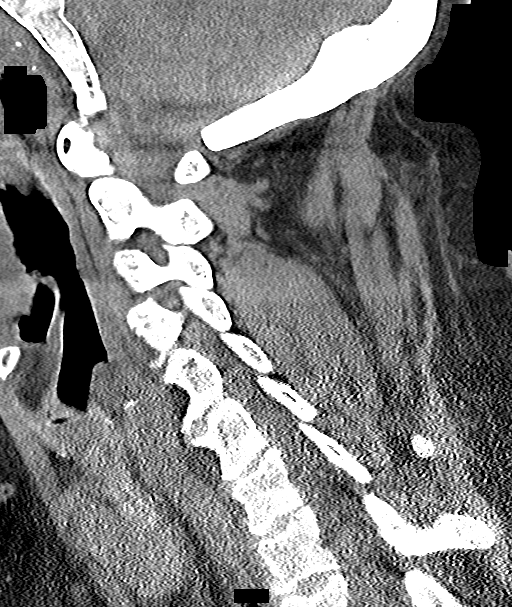
[im 31/61  bone]
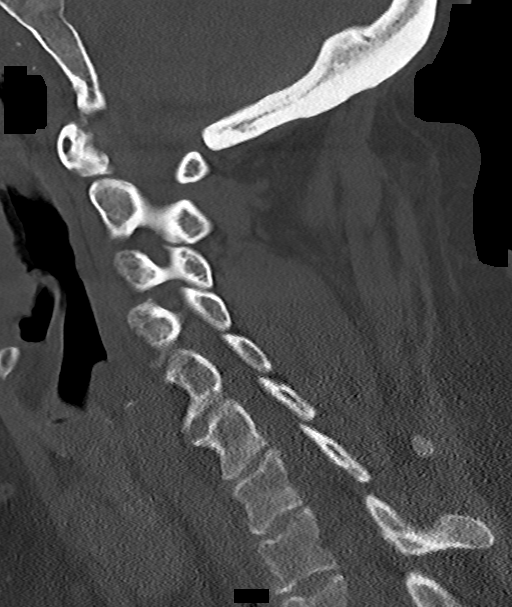
[im 36/61  bone]
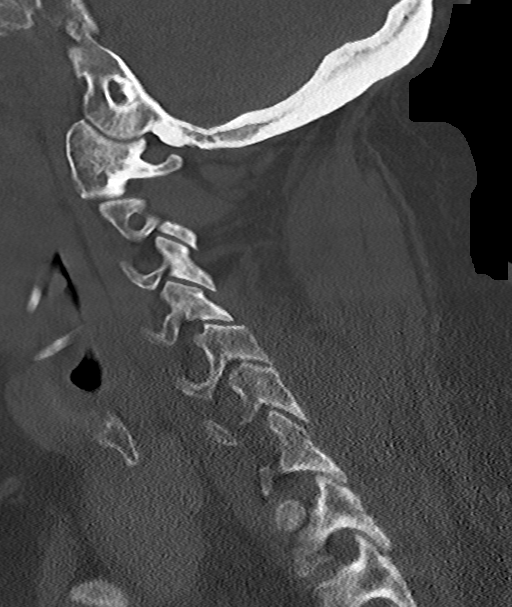
[im 41/61  bone]
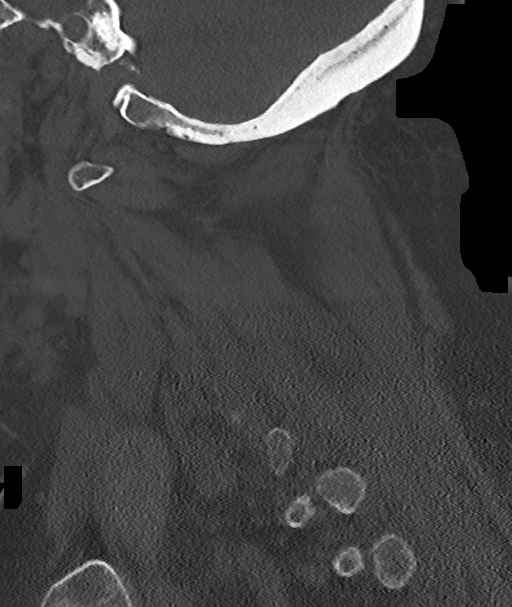

[Series 9: c_spine 2.0 cor bone · coronal · 0.23mm/px · 3 of 45 slices shown]
[im 9/45  bone]
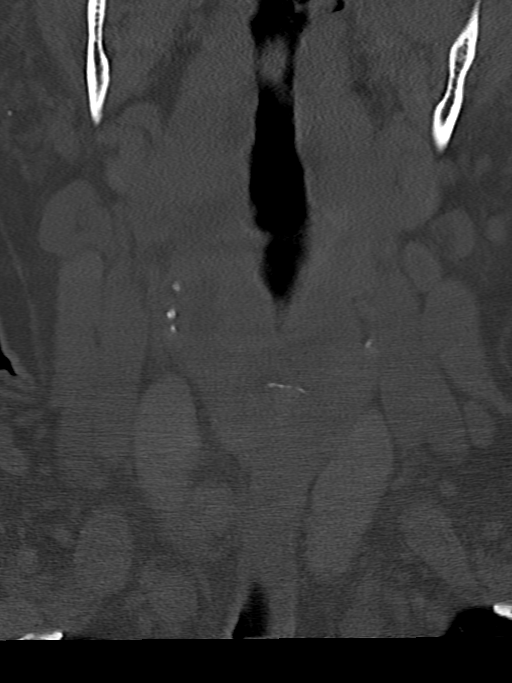
[im 18/45  bone]
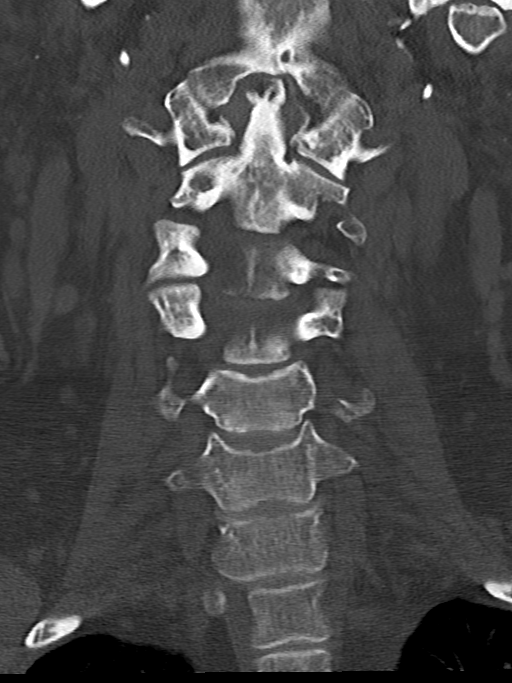
[im 27/45  bone]
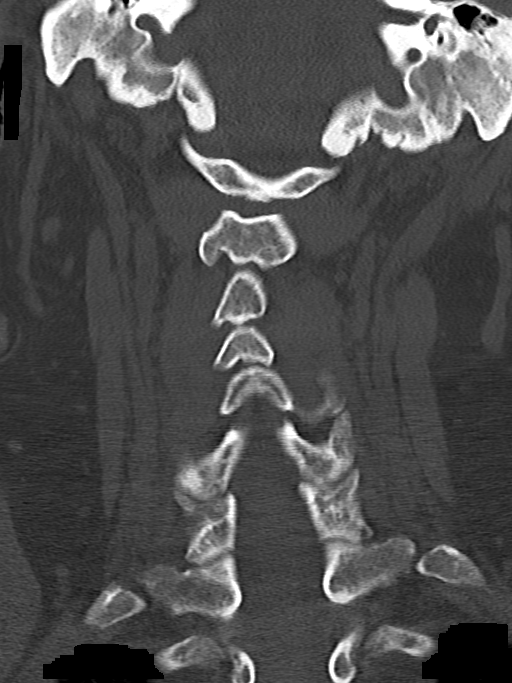

[13 of 33 positions shown; findings below may reference images not displayed]

FINDINGS: CT HEAD FINDINGS

Brain: No evidence of acute infarction, hemorrhage, hydrocephalus,
extra-axial collection or mass lesion/mass effect.

Vascular: No hyperdense vessel or unexpected calcification.

Skull: Normal. Negative for fracture or focal lesion.

Sinuses/Orbits: No acute finding.

Other: None.

CT CERVICAL SPINE FINDINGS

Alignment: Within normal limits.

Skull base and vertebrae: 7 cervical segments are well visualized.
Vertebral body height is well maintained. Osteophytic changes are
noted most prominent at C5-6. Mild facet hypertrophic changes are
noted. No acute fracture or acute facet abnormality is noted.

Soft tissues and spinal canal: Surrounding soft tissue structures
are within normal limits.

Upper chest: Visualized lung apices are unremarkable.

Other: None
IMPRESSION: CT of the head: No acute intracranial abnormality noted.

CT of the cervical spine: No acute abnormality is noted. Mild
degenerative changes are seen primarily at C5-6.

## 2021-02-11 IMAGING — DX DG CHEST 1V PORT
1 series · 1 of 1 positions shown · non-contrast
Comparison: June 04, 2019

CLINICAL DATA: Change in mental status

EXAM:
PORTABLE CHEST 1 VIEW

[chest ap]
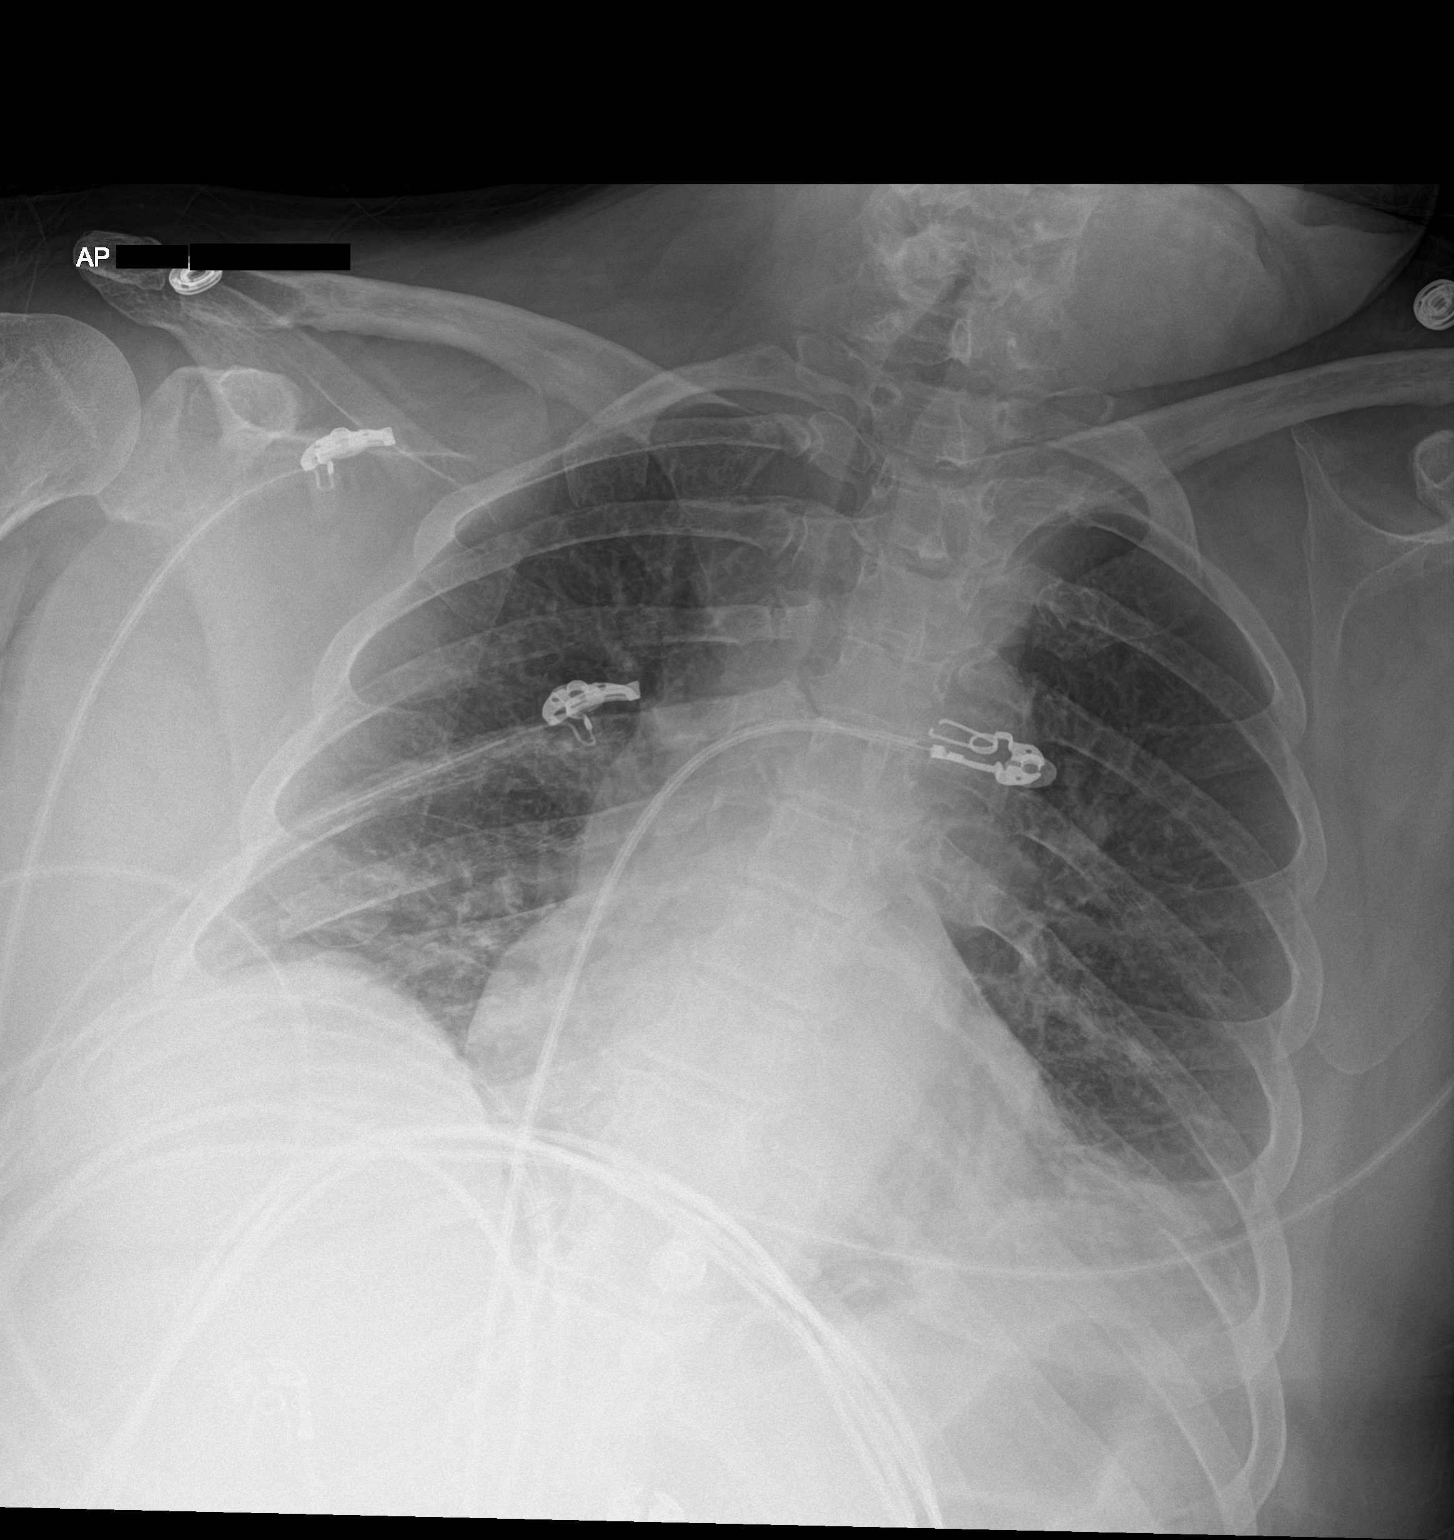

[1 of 1 positions shown; findings below may reference images not displayed]

FINDINGS: The heart size and mediastinal contours are mildly enlarged as on
prior exam. Bibasilar subsegmental atelectasis is noted. There is
elevation of the left hemidiaphragm. There is mild prominence of the
central pulmonary vasculature. No acute osseous abnormality. The
visualized skeletal structures are unremarkable.
IMPRESSION: Mild cardiomegaly and pulmonary vascular congestion.

## 2021-02-13 DIAGNOSIS — F0390 Unspecified dementia without behavioral disturbance: Secondary | ICD-10-CM | POA: Diagnosis not present

## 2021-02-13 DIAGNOSIS — F2089 Other schizophrenia: Secondary | ICD-10-CM | POA: Diagnosis not present

## 2021-02-13 DIAGNOSIS — R051 Acute cough: Secondary | ICD-10-CM | POA: Diagnosis not present

## 2021-02-13 DIAGNOSIS — J029 Acute pharyngitis, unspecified: Secondary | ICD-10-CM | POA: Diagnosis not present

## 2021-02-13 DIAGNOSIS — R112 Nausea with vomiting, unspecified: Secondary | ICD-10-CM | POA: Diagnosis not present

## 2021-02-13 DIAGNOSIS — E669 Obesity, unspecified: Secondary | ICD-10-CM | POA: Diagnosis not present

## 2021-02-13 DIAGNOSIS — E039 Hypothyroidism, unspecified: Secondary | ICD-10-CM | POA: Diagnosis not present

## 2021-02-13 DIAGNOSIS — J069 Acute upper respiratory infection, unspecified: Secondary | ICD-10-CM | POA: Diagnosis not present

## 2021-02-13 DIAGNOSIS — Z20822 Contact with and (suspected) exposure to covid-19: Secondary | ICD-10-CM | POA: Diagnosis not present

## 2021-02-13 DIAGNOSIS — E1169 Type 2 diabetes mellitus with other specified complication: Secondary | ICD-10-CM | POA: Diagnosis not present

## 2021-02-13 DIAGNOSIS — R0981 Nasal congestion: Secondary | ICD-10-CM | POA: Diagnosis not present

## 2021-02-13 DIAGNOSIS — I10 Essential (primary) hypertension: Secondary | ICD-10-CM | POA: Diagnosis not present

## 2021-02-14 ENCOUNTER — Other Ambulatory Visit: Payer: Self-pay

## 2021-02-14 ENCOUNTER — Ambulatory Visit (INDEPENDENT_AMBULATORY_CARE_PROVIDER_SITE_OTHER): Payer: Medicare Other | Admitting: Family Medicine

## 2021-02-14 ENCOUNTER — Encounter: Payer: Self-pay | Admitting: Family Medicine

## 2021-02-14 VITALS — BP 112/72 | HR 73 | Temp 98.3°F | Resp 17 | Ht 59.5 in | Wt 188.1 lb

## 2021-02-14 DIAGNOSIS — Z20822 Contact with and (suspected) exposure to covid-19: Secondary | ICD-10-CM

## 2021-02-14 DIAGNOSIS — K219 Gastro-esophageal reflux disease without esophagitis: Secondary | ICD-10-CM | POA: Diagnosis not present

## 2021-02-14 DIAGNOSIS — H1013 Acute atopic conjunctivitis, bilateral: Secondary | ICD-10-CM | POA: Diagnosis not present

## 2021-02-14 DIAGNOSIS — J310 Chronic rhinitis: Secondary | ICD-10-CM

## 2021-02-14 DIAGNOSIS — R058 Other specified cough: Secondary | ICD-10-CM

## 2021-02-14 DIAGNOSIS — H1010 Acute atopic conjunctivitis, unspecified eye: Secondary | ICD-10-CM

## 2021-02-14 MED ORDER — OLOPATADINE HCL 0.2 % OP SOLN: 1.0000 [drp] | Freq: Every day | OPHTHALMIC | 5 refills | Status: DC | PRN

## 2021-02-14 MED ORDER — LEVOCETIRIZINE DIHYDROCHLORIDE 5 MG PO TABS: 5.0000 mg | ORAL_TABLET | Freq: Every evening | ORAL | 5 refills | Status: DC

## 2021-02-14 MED ORDER — AZELASTINE HCL 0.15 % NA SOLN: NASAL | 5 refills | Status: DC

## 2021-02-14 MED ORDER — FLUTICASONE PROPIONATE 50 MCG/ACT NA SUSP: 2.0000 | Freq: Every day | NASAL | 5 refills | Status: DC

## 2021-02-14 MED ORDER — MONTELUKAST SODIUM 10 MG PO TABS: ORAL_TABLET | ORAL | 3 refills | Status: DC

## 2021-02-14 MED ORDER — LEVOCETIRIZINE DIHYDROCHLORIDE 5 MG PO TABS: 5.0000 mg | ORAL_TABLET | Freq: Every evening | ORAL | 6 refills | Status: DC

## 2021-02-14 NOTE — Patient Instructions (Addendum)
Mixed rhinitis Begin levocetirizine 5 mg once a day as needed for runny nose. This will replace cetirizine Continue the nasal Flonase 2 sprays in each nostril once a day for a stuffy nose. Right nostril point the applicator toward the right ear. Left nostril point the applicator toward the left ear Continue montelukast 10 mg once a day Continue azelastine nasal spray 2 sprays twice a day as needed for a runny nose or sneezing Continue saline nasal rinses as needed for nasal symptoms. Use this before any medicated nasal sprays for best result  Cough Stable Continue the treatment plan as listed above Continue albuterol once every 4 hours as needed with a spacer for cough or wheeze  Sleep apnea Recommend evaluation and management of CPAP use and CPAP materials  Allergic conjunctivitis Continue Pataday (olopatadine 0.2%) eye drops one drop in each eye once a day as needed for red, itchy eyes  Reflux Continue dietary and lifestyle modifications as listed below Continue Dexilant 60 mg once a day as previously prescribed An evaluation by an ENT specialist may be necessary for increased dysphagia evaluation  COVID exposure Continue to check at home COVID testing as she has experienced a positive exposure  Call me if this treatment plan is not working well for you  Follow up in 6 months or sooner if needed

## 2021-02-14 NOTE — Progress Notes (Signed)
Sonya Moran [id number] Dept: [phone]  FOLLOW UP NOTE  Patient ID: Sonya Moran, female    DOB: Jan 07, 1976  Age: 46 y.o. MRN: [id number] Date of Office Visit: 02/14/2021  Assessment  Chief Complaint: Allergies (/)  HPI Sonya Moran is a 46 year old female who presents the clinic for follow-up visit.  She was last seen in this clinic on 10/25/2020 by [name], FNP, for evaluation of mixed rhinitis, chronic cough, allergic conjunctivitis, and reflux.  She is accompanied by her caregiver who assists with history.  In the interim, her caregiver reports that someone in their house tested positive for COVID yesterday.  Arshia denies symptoms including fever, sweats, and chills. Lailana went to her primary care provider yesterday for regular follow-up visit where a throat culture was performed.  At today's visit, she reports her allergic rhinitis has been moderately well controlled with an increase in symptoms over the last 2 to 3 days including clear to yellow and back to clear rhinorrhea, nasal congestion, occasional headache, and postnasal drainage with throat clearing.  She continues montelukast 10 mg once a day, cetirizine 10 mg once a day, Flonase daily, and azelastine daily.  She is not currently using a saline nasal rinse.  Breathing is reported as moderately well controlled with occasional cough beginning several days ago which is producing some clear mucus.  She has used albuterol last night and possibly 1 time earlier this week with relief of symptoms.  Allergic conjunctivitis is reported as well controlled with olopatadine as needed.  Reflux is reported as moderately well controlled with occasional heartburn occurring with acidic foods such as tomato and blueberry.  She continues Dexilant daily with excellent relief.  Her caregiver reports that she has recently lost some teeth and reports that she has had a couple of incidences of choking on solid food.  She has no trouble with  liquids or foods that are cut into very small pieces.  She continues to utilize CPAP at night and is going for a sleep study for management of sleep apnea on February 27, 2021.  Her current medications are listed in the chart.   Drug Allergies:  Allergies  Allergen Reactions   Griseofulvin Anaphylaxis, Other (See Comments), Shortness Of Breath and Swelling    Arm swelling Other reaction(s): Other (See Comments) Arm swelling    Physical Exam: BP 112/72    Pulse 73    Temp 98.3 F (36.8 C) (Temporal)    Resp 17    Ht 4' 11.5" (1.511 m)    Wt 188 lb 1.6 oz (85.3 kg)    SpO2 99%    BMI 37.36 kg/m    Physical Exam Vitals reviewed.  Constitutional:      Appearance: Normal appearance.  HENT:     Head: Normocephalic and atraumatic.     Right Ear: Tympanic membrane normal.     Left Ear: Tympanic membrane normal.     Nose:     Comments: Bilateral nares slightly erythematous with clear nasal drainage noted.  Pharynx normal.  Ears normal.  Eyes normal.    Mouth/Throat:     Pharynx: Oropharynx is clear.  Eyes:     Conjunctiva/sclera: Conjunctivae normal.  Cardiovascular:     Rate and Rhythm: Normal rate and regular rhythm.     Heart sounds: Normal heart sounds. No murmur heard. Pulmonary:     Effort: Pulmonary effort is normal.     Breath sounds: Normal breath sounds.  Comments: Lungs clear to auscultation Musculoskeletal:        General: Normal range of motion.     Cervical back: Normal range of motion and neck supple.  Skin:    General: Skin is warm and dry.  Neurological:     Mental Status: She is alert and oriented to person, place, and time.  Psychiatric:        Mood and Affect: Mood normal.        Behavior: Behavior normal.        Thought Content: Thought content normal.        Judgment: Judgment normal.    Assessment and Plan: 1. Mixed rhinitis   2. Other cough   3. Seasonal allergic conjunctivitis   4. Gastroesophageal reflux disease, unspecified whether  esophagitis present   5. Close exposure to COVID-19 virus     Meds ordered this encounter  Medications   DISCONTD: levocetirizine (XYZAL) 5 MG tablet    Sig: Take 1 tablet (5 mg total) by mouth every evening.    Dispense:  30 tablet    Refill:  6   Azelastine HCl 0.15 % SOLN    Sig: USE 2 SPRAYS EACH NOSTRIL TWICE DAILY.    Dispense:  30 mL    Refill:  5   fluticasone (FLONASE) 50 MCG/ACT nasal spray    Sig: Place 2 sprays into both nostrils daily.    Dispense:  16 g    Refill:  5   montelukast (SINGULAIR) 10 MG tablet    Sig: TAKE ONE TABLET BY MOUTH EACH EVENING TO PREVENT COUGH OR WHEEZE    Dispense:  90 tablet    Refill:  3    One refill needs appt for further refills   Olopatadine HCl 0.2 % SOLN    Sig: Place 1 drop into both eyes daily as needed (for itching).    Dispense:  2.5 mL    Refill:  5   levocetirizine (XYZAL) 5 MG tablet    Sig: Take 1 tablet (5 mg total) by mouth every evening.    Dispense:  30 tablet    Refill:  5    Patient Instructions  Mixed rhinitis Begin levocetirizine 5 mg once a day as needed for runny nose. This will replace cetirizine Continue the nasal Flonase 2 sprays in each nostril once a day for a stuffy nose. Right nostril point the applicator toward the right ear. Left nostril point the applicator toward the left ear Continue montelukast 10 mg once a day Continue azelastine nasal spray 2 sprays twice a day as needed for a runny nose or sneezing Continue saline nasal rinses as needed for nasal symptoms. Use this before any medicated nasal sprays for best result  Cough Stable Continue the treatment plan as listed above Continue albuterol once every 4 hours as needed with a spacer for cough or wheeze  Sleep apnea Recommend evaluation and management of CPAP use and CPAP materials  Allergic conjunctivitis Continue Pataday (olopatadine 0.2%) eye drops one drop in each eye once a day as needed for red, itchy eyes  Reflux Continue dietary  and lifestyle modifications as listed below Continue Dexilant 60 mg once a day as previously prescribed An evaluation by an ENT specialist may be necessary for increased dysphagia evaluation  COVID exposure Continue to check at home COVID testing as she has experienced a positive exposure  Call me if this treatment plan is not working well for you  Follow up in 6 months  or sooner if needed   Return in about 6 months (around 08/14/2021), or if symptoms worsen or fail to improve.    Thank you for the opportunity to care for this patient.  Please do not hesitate to contact me with questions.  [name], FNP [facility]

## 2021-02-16 DIAGNOSIS — R0981 Nasal congestion: Secondary | ICD-10-CM | POA: Diagnosis not present

## 2021-02-24 DIAGNOSIS — K219 Gastro-esophageal reflux disease without esophagitis: Secondary | ICD-10-CM | POA: Diagnosis not present

## 2021-02-24 DIAGNOSIS — K581 Irritable bowel syndrome with constipation: Secondary | ICD-10-CM | POA: Diagnosis not present

## 2021-02-24 DIAGNOSIS — R1319 Other dysphagia: Secondary | ICD-10-CM | POA: Diagnosis not present

## 2021-02-24 DIAGNOSIS — Z1211 Encounter for screening for malignant neoplasm of colon: Secondary | ICD-10-CM | POA: Diagnosis not present

## 2021-02-26 DIAGNOSIS — I10 Essential (primary) hypertension: Secondary | ICD-10-CM | POA: Diagnosis not present

## 2021-02-26 DIAGNOSIS — J029 Acute pharyngitis, unspecified: Secondary | ICD-10-CM | POA: Diagnosis not present

## 2021-02-27 ENCOUNTER — Other Ambulatory Visit: Payer: Self-pay

## 2021-02-27 ENCOUNTER — Ambulatory Visit (HOSPITAL_BASED_OUTPATIENT_CLINIC_OR_DEPARTMENT_OTHER): Payer: Medicare Other | Attending: Family Medicine | Admitting: Internal Medicine

## 2021-02-27 VITALS — Ht 59.0 in | Wt 187.0 lb

## 2021-02-27 DIAGNOSIS — R5383 Other fatigue: Secondary | ICD-10-CM | POA: Diagnosis not present

## 2021-02-27 DIAGNOSIS — G4731 Primary central sleep apnea: Secondary | ICD-10-CM | POA: Insufficient documentation

## 2021-02-27 DIAGNOSIS — E669 Obesity, unspecified: Secondary | ICD-10-CM | POA: Insufficient documentation

## 2021-02-27 DIAGNOSIS — G4733 Obstructive sleep apnea (adult) (pediatric): Secondary | ICD-10-CM | POA: Insufficient documentation

## 2021-02-27 DIAGNOSIS — R519 Headache, unspecified: Secondary | ICD-10-CM | POA: Diagnosis not present

## 2021-02-27 DIAGNOSIS — R0683 Snoring: Secondary | ICD-10-CM | POA: Diagnosis not present

## 2021-02-27 DIAGNOSIS — Z6838 Body mass index (BMI) 38.0-38.9, adult: Secondary | ICD-10-CM | POA: Diagnosis not present

## 2021-03-04 DIAGNOSIS — G4733 Obstructive sleep apnea (adult) (pediatric): Secondary | ICD-10-CM

## 2021-03-04 NOTE — Procedures (Signed)
° ° ° °  Patient Name: Sonya Moran, Sonya Moran Date: 02/27/2021 Gender: Female D.O.B: 02-11-1975 Age (years): 45 Referring Provider: Lucianne Lei Height (inches): 33 Interpreting Physician: Baird Lyons MD, ABSM Weight (lbs): 187 RPSGT: Laren Everts BMI: 38 MRN: [id number] Neck Size: 19.50  CLINICAL INFORMATION Sleep Study Type: NPSG Indication for sleep study: Fatigue, Morning Headaches, Obesity, OSA, Snoring Epworth Sleepiness Score: 14  SLEEP STUDY TECHNIQUE As per the AASM Manual for the Scoring of Sleep and Associated Events v2.3 (April 2016) with a hypopnea requiring 4% desaturations.  The channels recorded and monitored were frontal, central and occipital EEG, electrooculogram (EOG), submentalis EMG (chin), nasal and oral airflow, thoracic and abdominal wall motion, anterior tibialis EMG, snore microphone, electrocardiogram, and pulse oximetry.  MEDICATIONS Medications self-administered by patient taken the night of the study : MONTELUKAST, OLANZAPINE, AZELASTINE HCL, PROPRANOLOL, AYR SALINE MIST, AMITRIPTYLINE, Levocetirizine, MAGIC MOUTHWASH  SLEEP ARCHITECTURE The study was initiated at 10:04:56 PM and ended at 4:42:04 AM.  Sleep onset time was 22.5 minutes and the sleep efficiency was 25.8%. The total sleep time was 102.5 minutes.  Stage REM latency was 278.5 minutes.  The patient spent 70.24% of the night in stage N1 sleep, 28.78% in stage N2 sleep, 0.00% in stage N3 and 1% in REM.  Alpha intrusion was absent.  Supine sleep was 14.15%.  RESPIRATORY PARAMETERS The overall apnea/hypopnea index (AHI) was 33.4 per hour. There were 50 total apneas, including 50 obstructive, 0 central and 0 mixed apneas. There were 7 hypopneas and 40 RERAs.  The AHI during Stage REM sleep was 60.0 per hour.  AHI while supine was 41.4 per hour.  The mean oxygen saturation was 92.30%. The minimum SpO2 during sleep was 87.00%.  moderate snoring was noted during this study.  CARDIAC  DATA The 2 lead EKG demonstrated sinus rhythm. The mean heart rate was 63.18 beats per minute. Other EKG findings include: None.  LEG MOVEMENT DATA The total PLMS were 0 with a resulting PLMS index of 0.00. Associated arousal with leg movement index was 0.0 . IMPRESSIONS - Severe obstructive sleep apnea occurred during this study (AHI = 33.4/h). - Significant difficulty maintaining sleep. Insufficient early sleep to meet protocol requirement for split CPAP titration. - Mild oxygen desaturation was noted during this study (Min O2 = 87.00%). Mean 92.3% - The patient snored with moderate snoring volume. - No cardiac abnormalities were noted during this study. - Clinically significant periodic limb movements did not occur during sleep. No significant associated arousals. - Bathroom x 4.  DIAGNOSIS - Obstructive Sleep Apnea (G47.33)  RECOMMENDATIONS - Suggest return for CPAP titration sleep study or autopap. Other options would be based on clinical judgment. - Additional help for insomnia might be needed depending on sleep experience in the home environment. - Sleep hygiene should be reviewed to assess factors that may improve sleep quality. - Weight management and regular exercise should be initiated or continued if appropriate.  [Electronically signed] 03/04/2021 11:41 AM  Baird Lyons MD, ABSM Diplomate, American Board of Sleep Medicine   NPI: [id number]                        Tom Bean, [facility]  ELECTRONICALLY SIGNED ON:  03/04/2021, 11:36 AM [place] PH: [phone]   FX: [phone] [place]

## 2021-03-13 DIAGNOSIS — F2089 Other schizophrenia: Secondary | ICD-10-CM | POA: Diagnosis not present

## 2021-03-13 DIAGNOSIS — E039 Hypothyroidism, unspecified: Secondary | ICD-10-CM | POA: Diagnosis not present

## 2021-03-13 DIAGNOSIS — N1831 Chronic kidney disease, stage 3a: Secondary | ICD-10-CM | POA: Diagnosis not present

## 2021-03-22 DIAGNOSIS — F609 Personality disorder, unspecified: Secondary | ICD-10-CM | POA: Diagnosis not present

## 2021-03-22 DIAGNOSIS — F251 Schizoaffective disorder, depressive type: Secondary | ICD-10-CM | POA: Diagnosis not present

## 2021-03-22 DIAGNOSIS — F70 Mild intellectual disabilities: Secondary | ICD-10-CM | POA: Diagnosis not present

## 2021-03-24 DIAGNOSIS — Z20822 Contact with and (suspected) exposure to covid-19: Secondary | ICD-10-CM | POA: Diagnosis not present

## 2021-03-26 DIAGNOSIS — F70 Mild intellectual disabilities: Secondary | ICD-10-CM | POA: Diagnosis not present

## 2021-03-26 DIAGNOSIS — F251 Schizoaffective disorder, depressive type: Secondary | ICD-10-CM | POA: Diagnosis not present

## 2021-03-26 DIAGNOSIS — Z79899 Other long term (current) drug therapy: Secondary | ICD-10-CM | POA: Diagnosis not present

## 2021-03-26 DIAGNOSIS — F609 Personality disorder, unspecified: Secondary | ICD-10-CM | POA: Diagnosis not present

## 2021-04-09 DIAGNOSIS — Z1211 Encounter for screening for malignant neoplasm of colon: Secondary | ICD-10-CM | POA: Diagnosis not present

## 2021-04-09 DIAGNOSIS — K581 Irritable bowel syndrome with constipation: Secondary | ICD-10-CM | POA: Diagnosis not present

## 2021-04-09 DIAGNOSIS — R1319 Other dysphagia: Secondary | ICD-10-CM | POA: Diagnosis not present

## 2021-04-09 DIAGNOSIS — E669 Obesity, unspecified: Secondary | ICD-10-CM | POA: Diagnosis not present

## 2021-04-13 DIAGNOSIS — N1831 Chronic kidney disease, stage 3a: Secondary | ICD-10-CM | POA: Diagnosis not present

## 2021-04-13 DIAGNOSIS — E039 Hypothyroidism, unspecified: Secondary | ICD-10-CM | POA: Diagnosis not present

## 2021-04-18 ENCOUNTER — Telehealth: Payer: Self-pay | Admitting: *Deleted

## 2021-04-18 DIAGNOSIS — Z6837 Body mass index (BMI) 37.0-37.9, adult: Secondary | ICD-10-CM | POA: Diagnosis not present

## 2021-04-18 DIAGNOSIS — R300 Dysuria: Secondary | ICD-10-CM | POA: Diagnosis not present

## 2021-04-18 DIAGNOSIS — N390 Urinary tract infection, site not specified: Secondary | ICD-10-CM | POA: Diagnosis not present

## 2021-04-18 DIAGNOSIS — B3731 Acute candidiasis of vulva and vagina: Secondary | ICD-10-CM | POA: Diagnosis not present

## 2021-04-18 DIAGNOSIS — B369 Superficial mycosis, unspecified: Secondary | ICD-10-CM | POA: Diagnosis not present

## 2021-04-18 NOTE — Telephone Encounter (Signed)
Caregiver would like patient to be seen today for UTI symptoms. The office does not have a provider today, but advised caregiver to call patient's PCP. If PCP is not able to see her, that she can go to an urgent care. Caregiver agreed. ?

## 2021-04-23 DIAGNOSIS — L219 Seborrheic dermatitis, unspecified: Secondary | ICD-10-CM | POA: Diagnosis not present

## 2021-04-23 DIAGNOSIS — L309 Dermatitis, unspecified: Secondary | ICD-10-CM | POA: Diagnosis not present

## 2021-04-24 DIAGNOSIS — Z20822 Contact with and (suspected) exposure to covid-19: Secondary | ICD-10-CM | POA: Diagnosis not present

## 2021-04-24 DIAGNOSIS — Z6838 Body mass index (BMI) 38.0-38.9, adult: Secondary | ICD-10-CM | POA: Diagnosis not present

## 2021-04-24 DIAGNOSIS — R300 Dysuria: Secondary | ICD-10-CM | POA: Diagnosis not present

## 2021-04-24 DIAGNOSIS — J329 Chronic sinusitis, unspecified: Secondary | ICD-10-CM | POA: Diagnosis not present

## 2021-04-24 DIAGNOSIS — B3731 Acute candidiasis of vulva and vagina: Secondary | ICD-10-CM | POA: Diagnosis not present

## 2021-04-24 DIAGNOSIS — R809 Proteinuria, unspecified: Secondary | ICD-10-CM | POA: Diagnosis not present

## 2021-04-24 DIAGNOSIS — B9689 Other specified bacterial agents as the cause of diseases classified elsewhere: Secondary | ICD-10-CM | POA: Diagnosis not present

## 2021-04-30 DIAGNOSIS — Z20822 Contact with and (suspected) exposure to covid-19: Secondary | ICD-10-CM | POA: Diagnosis not present

## 2021-05-01 DIAGNOSIS — J029 Acute pharyngitis, unspecified: Secondary | ICD-10-CM | POA: Diagnosis not present

## 2021-05-01 DIAGNOSIS — E669 Obesity, unspecified: Secondary | ICD-10-CM | POA: Diagnosis not present

## 2021-05-01 DIAGNOSIS — R0981 Nasal congestion: Secondary | ICD-10-CM | POA: Diagnosis not present

## 2021-05-01 DIAGNOSIS — B3731 Acute candidiasis of vulva and vagina: Secondary | ICD-10-CM | POA: Diagnosis not present

## 2021-05-01 DIAGNOSIS — Z6833 Body mass index (BMI) 33.0-33.9, adult: Secondary | ICD-10-CM | POA: Diagnosis not present

## 2021-05-07 DIAGNOSIS — H10413 Chronic giant papillary conjunctivitis, bilateral: Secondary | ICD-10-CM | POA: Diagnosis not present

## 2021-05-08 DIAGNOSIS — Z6838 Body mass index (BMI) 38.0-38.9, adult: Secondary | ICD-10-CM | POA: Diagnosis not present

## 2021-05-08 DIAGNOSIS — R1084 Generalized abdominal pain: Secondary | ICD-10-CM | POA: Diagnosis not present

## 2021-05-08 DIAGNOSIS — R300 Dysuria: Secondary | ICD-10-CM | POA: Diagnosis not present

## 2021-05-08 DIAGNOSIS — E669 Obesity, unspecified: Secondary | ICD-10-CM | POA: Diagnosis not present

## 2021-05-08 DIAGNOSIS — K5900 Constipation, unspecified: Secondary | ICD-10-CM | POA: Diagnosis not present

## 2021-05-18 DIAGNOSIS — Z20822 Contact with and (suspected) exposure to covid-19: Secondary | ICD-10-CM | POA: Diagnosis not present

## 2021-05-21 DIAGNOSIS — Z20822 Contact with and (suspected) exposure to covid-19: Secondary | ICD-10-CM | POA: Diagnosis not present

## 2021-05-28 DIAGNOSIS — F609 Personality disorder, unspecified: Secondary | ICD-10-CM | POA: Diagnosis not present

## 2021-05-28 DIAGNOSIS — F251 Schizoaffective disorder, depressive type: Secondary | ICD-10-CM | POA: Diagnosis not present

## 2021-05-28 DIAGNOSIS — F70 Mild intellectual disabilities: Secondary | ICD-10-CM | POA: Diagnosis not present

## 2021-06-13 DIAGNOSIS — E039 Hypothyroidism, unspecified: Secondary | ICD-10-CM | POA: Diagnosis not present

## 2021-06-13 DIAGNOSIS — N1831 Chronic kidney disease, stage 3a: Secondary | ICD-10-CM | POA: Diagnosis not present

## 2021-06-18 ENCOUNTER — Encounter (HOSPITAL_COMMUNITY): Payer: Self-pay

## 2021-06-18 ENCOUNTER — Emergency Department (HOSPITAL_COMMUNITY)
Admission: EM | Admit: 2021-06-18 | Discharge: 2021-06-18 | Disposition: A | Discharge status: Home / Self Care | Payer: Medicare Other | Attending: Emergency Medicine | Admitting: Emergency Medicine

## 2021-06-18 ENCOUNTER — Emergency Department (HOSPITAL_COMMUNITY): Payer: Medicare Other

## 2021-06-18 DIAGNOSIS — S299XXA Unspecified injury of thorax, initial encounter: Secondary | ICD-10-CM | POA: Diagnosis not present

## 2021-06-18 DIAGNOSIS — K760 Fatty (change of) liver, not elsewhere classified: Secondary | ICD-10-CM | POA: Diagnosis not present

## 2021-06-18 DIAGNOSIS — S82891A Other fracture of right lower leg, initial encounter for closed fracture: Secondary | ICD-10-CM | POA: Diagnosis not present

## 2021-06-18 DIAGNOSIS — S3991XA Unspecified injury of abdomen, initial encounter: Secondary | ICD-10-CM | POA: Diagnosis not present

## 2021-06-18 DIAGNOSIS — M25561 Pain in right knee: Secondary | ICD-10-CM | POA: Diagnosis not present

## 2021-06-18 DIAGNOSIS — S0990XA Unspecified injury of head, initial encounter: Secondary | ICD-10-CM | POA: Diagnosis not present

## 2021-06-18 DIAGNOSIS — Z041 Encounter for examination and observation following transport accident: Secondary | ICD-10-CM | POA: Diagnosis not present

## 2021-06-18 DIAGNOSIS — Y92410 Unspecified street and highway as the place of occurrence of the external cause: Secondary | ICD-10-CM | POA: Diagnosis not present

## 2021-06-18 DIAGNOSIS — J9809 Other diseases of bronchus, not elsewhere classified: Secondary | ICD-10-CM | POA: Diagnosis not present

## 2021-06-18 DIAGNOSIS — N3289 Other specified disorders of bladder: Secondary | ICD-10-CM | POA: Diagnosis not present

## 2021-06-18 DIAGNOSIS — R079 Chest pain, unspecified: Secondary | ICD-10-CM | POA: Diagnosis not present

## 2021-06-18 DIAGNOSIS — M25532 Pain in left wrist: Secondary | ICD-10-CM | POA: Diagnosis not present

## 2021-06-18 DIAGNOSIS — R609 Edema, unspecified: Secondary | ICD-10-CM | POA: Diagnosis not present

## 2021-06-18 DIAGNOSIS — M549 Dorsalgia, unspecified: Secondary | ICD-10-CM | POA: Diagnosis not present

## 2021-06-18 DIAGNOSIS — M542 Cervicalgia: Secondary | ICD-10-CM | POA: Diagnosis not present

## 2021-06-18 DIAGNOSIS — M25571 Pain in right ankle and joints of right foot: Secondary | ICD-10-CM | POA: Insufficient documentation

## 2021-06-18 LAB — I-STAT CHEM 8, ED
BUN: 9 mg/dL (ref 6–20)
Calcium, Ion: 1.3 mmol/L (ref 1.15–1.40)
Chloride: 98 mmol/L (ref 98–111)
Creatinine, Ser: 1 mg/dL (ref 0.44–1.00)
Glucose, Bld: 116 mg/dL — ABNORMAL HIGH (ref 70–99)
HCT: 37 % (ref 36.0–46.0)
Hemoglobin: 12.6 g/dL (ref 12.0–15.0)
Potassium: 3.9 mmol/L (ref 3.5–5.1)
Sodium: 139 mmol/L (ref 135–145)
TCO2: 29 mmol/L (ref 22–32)

## 2021-06-18 MED ORDER — SODIUM CHLORIDE (PF) 0.9 % IJ SOLN
INTRAMUSCULAR | Status: AC
  Filled 2021-06-18: qty 50

## 2021-06-18 MED ORDER — IOHEXOL 300 MG/ML  SOLN
100.0000 mL | Freq: Once | INTRAMUSCULAR | Status: AC | PRN
  Administered 2021-06-18: 100 mL via INTRAVENOUS

## 2021-06-18 MED ORDER — ACETAMINOPHEN 325 MG PO TABS
650.0000 mg | ORAL_TABLET | Freq: Once | ORAL | Status: AC
  Administered 2021-06-18: 650 mg via ORAL
  Filled 2021-06-18: qty 2
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
## 2021-06-26 ENCOUNTER — Other Ambulatory Visit: Payer: Self-pay | Admitting: Family Medicine

## 2021-06-27 DIAGNOSIS — R1319 Other dysphagia: Secondary | ICD-10-CM | POA: Diagnosis not present

## 2021-06-27 DIAGNOSIS — K635 Polyp of colon: Secondary | ICD-10-CM | POA: Diagnosis not present

## 2021-06-27 DIAGNOSIS — Z1211 Encounter for screening for malignant neoplasm of colon: Secondary | ICD-10-CM | POA: Diagnosis not present

## 2021-07-06 DIAGNOSIS — Z6838 Body mass index (BMI) 38.0-38.9, adult: Secondary | ICD-10-CM | POA: Diagnosis not present

## 2021-07-06 DIAGNOSIS — D696 Thrombocytopenia, unspecified: Secondary | ICD-10-CM | POA: Diagnosis not present

## 2021-07-06 DIAGNOSIS — H6691 Otitis media, unspecified, right ear: Secondary | ICD-10-CM | POA: Diagnosis not present

## 2021-07-06 DIAGNOSIS — R42 Dizziness and giddiness: Secondary | ICD-10-CM | POA: Diagnosis not present

## 2021-07-11 DIAGNOSIS — A048 Other specified bacterial intestinal infections: Secondary | ICD-10-CM | POA: Diagnosis not present

## 2021-07-11 DIAGNOSIS — K635 Polyp of colon: Secondary | ICD-10-CM | POA: Diagnosis not present

## 2021-07-30 ENCOUNTER — Other Ambulatory Visit: Payer: Self-pay | Admitting: Family Medicine

## 2021-07-30 DIAGNOSIS — D126 Benign neoplasm of colon, unspecified: Secondary | ICD-10-CM | POA: Diagnosis not present

## 2021-07-30 DIAGNOSIS — K219 Gastro-esophageal reflux disease without esophagitis: Secondary | ICD-10-CM | POA: Diagnosis not present

## 2021-07-30 DIAGNOSIS — K581 Irritable bowel syndrome with constipation: Secondary | ICD-10-CM | POA: Diagnosis not present

## 2021-08-16 DIAGNOSIS — B3731 Acute candidiasis of vulva and vagina: Secondary | ICD-10-CM | POA: Diagnosis not present

## 2021-08-16 DIAGNOSIS — Z6838 Body mass index (BMI) 38.0-38.9, adult: Secondary | ICD-10-CM | POA: Diagnosis not present

## 2021-08-16 DIAGNOSIS — R300 Dysuria: Secondary | ICD-10-CM | POA: Diagnosis not present

## 2021-08-29 ENCOUNTER — Other Ambulatory Visit: Payer: Self-pay | Admitting: Family Medicine

## 2021-09-04 DIAGNOSIS — F609 Personality disorder, unspecified: Secondary | ICD-10-CM | POA: Diagnosis not present

## 2021-09-04 DIAGNOSIS — F251 Schizoaffective disorder, depressive type: Secondary | ICD-10-CM | POA: Diagnosis not present

## 2021-09-04 DIAGNOSIS — F70 Mild intellectual disabilities: Secondary | ICD-10-CM | POA: Diagnosis not present

## 2021-09-20 DIAGNOSIS — E7800 Pure hypercholesterolemia, unspecified: Secondary | ICD-10-CM | POA: Diagnosis not present

## 2021-09-20 DIAGNOSIS — Z6838 Body mass index (BMI) 38.0-38.9, adult: Secondary | ICD-10-CM | POA: Diagnosis not present

## 2021-09-20 DIAGNOSIS — B3789 Other sites of candidiasis: Secondary | ICD-10-CM | POA: Diagnosis not present

## 2021-09-20 DIAGNOSIS — Z1159 Encounter for screening for other viral diseases: Secondary | ICD-10-CM | POA: Diagnosis not present

## 2021-09-20 DIAGNOSIS — E559 Vitamin D deficiency, unspecified: Secondary | ICD-10-CM | POA: Diagnosis not present

## 2021-09-20 DIAGNOSIS — R0602 Shortness of breath: Secondary | ICD-10-CM | POA: Diagnosis not present

## 2021-09-20 DIAGNOSIS — Z79899 Other long term (current) drug therapy: Secondary | ICD-10-CM | POA: Diagnosis not present

## 2021-09-20 DIAGNOSIS — Z0000 Encounter for general adult medical examination without abnormal findings: Secondary | ICD-10-CM | POA: Diagnosis not present

## 2021-10-03 ENCOUNTER — Ambulatory Visit (INDEPENDENT_AMBULATORY_CARE_PROVIDER_SITE_OTHER): Payer: Medicare Other | Admitting: Internal Medicine

## 2021-10-03 ENCOUNTER — Encounter: Payer: Self-pay | Admitting: Internal Medicine

## 2021-10-03 VITALS — BP 108/80 | HR 74 | Temp 98.0°F | Resp 24 | Ht 59.0 in | Wt 188.6 lb

## 2021-10-03 DIAGNOSIS — K219 Gastro-esophageal reflux disease without esophagitis: Secondary | ICD-10-CM

## 2021-10-03 DIAGNOSIS — G4733 Obstructive sleep apnea (adult) (pediatric): Secondary | ICD-10-CM

## 2021-10-03 DIAGNOSIS — J3489 Other specified disorders of nose and nasal sinuses: Secondary | ICD-10-CM

## 2021-10-03 DIAGNOSIS — R053 Chronic cough: Secondary | ICD-10-CM | POA: Diagnosis not present

## 2021-10-03 DIAGNOSIS — J3089 Other allergic rhinitis: Secondary | ICD-10-CM

## 2021-10-03 DIAGNOSIS — Z9989 Dependence on other enabling machines and devices: Secondary | ICD-10-CM

## 2021-10-03 DIAGNOSIS — H1013 Acute atopic conjunctivitis, bilateral: Secondary | ICD-10-CM | POA: Diagnosis not present

## 2021-10-03 DIAGNOSIS — H6501 Acute serous otitis media, right ear: Secondary | ICD-10-CM | POA: Diagnosis not present

## 2021-10-03 DIAGNOSIS — H1045 Other chronic allergic conjunctivitis: Secondary | ICD-10-CM

## 2021-10-03 MED ORDER — AMOXICILLIN-POT CLAVULANATE 875-125 MG PO TABS: 1.0000 | ORAL_TABLET | Freq: Two times a day (BID) | ORAL | 0 refills | Status: AC

## 2021-10-03 NOTE — Patient Instructions (Addendum)
Mixed rhinitis: acute flare  Start prednisone taper. Prednisone '10mg'$  tablets - take 2 tablets for 4 days then 1 tablet on day 5.   Continue levocetirizine 5 mg once a day as needed for runny nose.  Continue the nasal Flonase 2 sprays in each nostril once a day for a stuffy nose. Right nostril point the applicator toward the right ear. Left nostril point the applicator toward the left ear Continue montelukast 10 mg once a day Continue azelastine nasal spray 2 sprays twice a day as needed for a runny nose or sneezing Continue saline nasal rinses as needed for nasal symptoms. Use this before any medicated nasal sprays for best result   Acute Otitis Media:   Augmentin 875/'125mg'$  1 tab twice daily for 5 days   Cough Continue albuterol nebs once every 4 hours as needed for cough or wheeze   Sleep apnea Recommend evaluation and management of CPAP use and CPAP materials Follow up with prescribed for CPAP issues   Allergic conjunctivitis Continue Pataday (olopatadine 0.2%) eye drops one drop in each eye once a day as needed for red, itchy eyes  Reflux Continue dietary and lifestyle modifications as listed below Continue Dexilant 60 mg once a day as previously prescribed  Follow up: in 2 weeks if not better otherwise in 3 months   Thank you so much for letting me partake in your care today.  Don't hesitate to reach out if you have any additional concerns!  Roney Marion, MD  [facility] [place], [place]

## 2021-10-03 NOTE — Progress Notes (Signed)
Follow Up Note  RE: Sonya Moran MRN: [id number] DOB: 1975/06/29 Date of Office Visit: 10/03/2021  Referring provider: Lucianne Lei, MD Primary care provider: Lucianne Lei, MD  Chief Complaint: Allergic Rhinitis  and Nasal Congestion (Guardian states that pt have been very congested and eyes have been itching, she haven't been using her neb, either. )  History of Present Illness: I had the pleasure of seeing Sonya Moran for a follow up visit at the [facility] [place] on 10/03/2021. She is a 46 y.o. female, who is being followed for mixed rhinitis, chronic cough, sleep apnea, allergic conjunctivitis, reflux. Her previous allergy office visit was on 02/14/21 with [name], [place]. Today is a regular follow up visit.  History obtained from patient, chart review and caregiver.  Over the past week or so caretaker noticed increased snoring sounds, nasal congestion and heavy breathing.  She has OSA on CPAP, but is having issues with CPAP use.  Also with ear fullness and itching,  Denies any cough, wheezing, dyspnea.   She is taking her levocetirizine, Flonase, montelukast, Astelin and saline sprays.  However they are not helping.  She is tearful in the appointment due to ear pain right greater than left.  Caregiver has given albuterol nebs without good response.    Assessment and Plan: Ngoc is a 46 y.o. female with: Non-recurrent acute serous otitis media of right ear  Other allergic rhinitis  Nasal obstruction  Refractory chronic cough  OSA on CPAP  Other chronic allergic conjunctivitis of both eyes  Gastroesophageal reflux disease without esophagitis Plan: Patient Instructions  Mixed rhinitis: acute flare  Start prednisone taper. Prednisone '10mg'$  tablets - take 2 tablets for 4 days then 1 tablet on day 5.   Continue levocetirizine 5 mg once a day as needed for runny nose.  Continue the nasal Flonase 2 sprays in each nostril once a day for a stuffy nose. Right nostril point  the applicator toward the right ear. Left nostril point the applicator toward the left ear Continue montelukast 10 mg once a day Continue azelastine nasal spray 2 sprays twice a day as needed for a runny nose or sneezing Continue saline nasal rinses as needed for nasal symptoms. Use this before any medicated nasal sprays for best result   Acute Otitis Media:   Augmentin 875/'125mg'$  1 tab twice daily for 5 days   Cough Continue albuterol nebs once every 4 hours as needed for cough or wheeze   Sleep apnea Recommend evaluation and management of CPAP use and CPAP materials Follow up with prescribed for CPAP issues   Allergic conjunctivitis Continue Pataday (olopatadine 0.2%) eye drops one drop in each eye once a day as needed for red, itchy eyes  Reflux Continue dietary and lifestyle modifications as listed below Continue Dexilant 60 mg once a day as previously prescribed  Follow up: in 2 weeks if not better otherwise in 3 months   Thank you so much for letting me partake in your care today.  Don't hesitate to reach out if you have any additional concerns!  Roney Marion, MD  [facility] [place] [place] No follow-ups on file.  Meds ordered this encounter  Medications   amoxicillin-clavulanate (AUGMENTIN) 875-125 MG tablet    Sig: Take 1 tablet by mouth 2 (two) times daily for 5 days.    Dispense:  10 tablet    Refill:  0    Lab Orders  No laboratory test(s) ordered today  Diagnostics: Spirometry:  Tracings reviewed. Her effort: Poor effort, data can not be interpreted.     Medication List:  Current Outpatient Medications  Medication Sig Dispense Refill   amoxicillin-clavulanate (AUGMENTIN) 875-125 MG tablet Take 1 tablet by mouth 2 (two) times daily for 5 days. 10 tablet 0   albuterol (VENTOLIN HFA) 108 (90 Base) MCG/ACT inhaler Inhale 2 puffs into the lungs every 6 (six) hours as needed for wheezing or shortness of breath. (Patient not taking: Reported  on 10/25/2020) 6.7 g 0   amitriptyline (ELAVIL) 100 MG tablet Take 100 mg by mouth at bedtime.      azelastine (ASTELIN) 0.1 % nasal spray Place into both nostrils.     Azelastine HCl 0.15 % SOLN USE 2 SPRAYS EACH NOSTRIL TWICE DAILY. 30 mL 5   cephALEXin (KEFLEX) 500 MG capsule Take 1 capsule (500 mg total) by mouth 2 (two) times daily. (Patient not taking: Reported on 11/06/2020) 20 capsule 0   dexlansoprazole (DEXILANT) 60 MG capsule TAKE 1 CAPSULE BY MOUTH ONCE A DAY. 30 capsule 0   fenofibrate (TRICOR) 145 MG tablet Take 145 mg by mouth at bedtime.  (Patient not taking: Reported on 11/06/2020)     fluticasone (FLONASE) 50 MCG/ACT nasal spray Place 2 sprays into both nostrils daily. 16 g 5   lamoTRIgine (LAMICTAL) 200 MG tablet Take 200 mg by mouth daily.      levocetirizine (XYZAL) 5 MG tablet TAKE 1 TABLET BY MOUTH AT BEDTIME. 30 tablet 5   levothyroxine (SYNTHROID) 25 MCG tablet Take 25 mcg by mouth daily.      LINZESS 145 MCG CAPS capsule Take 290 mcg by mouth daily.     LORazepam (ATIVAN) 1 MG tablet Take by mouth.     mirabegron ER (MYRBETRIQ) 25 MG TB24 tablet Take 25 mg by mouth daily.     montelukast (SINGULAIR) 10 MG tablet TAKE ONE TABLET BY MOUTH EACH EVENING TO PREVENT COUGH OR WHEEZE 90 tablet 3   naproxen (NAPROSYN) 500 MG tablet Take 1 tablet (500 mg total) by mouth 2 (two) times daily. (Patient not taking: Reported on 11/06/2020) 30 tablet 0   nystatin-triamcinolone ointment (MYCOLOG) Apply 1 application topically 2 (two) times daily. 30 g 3   OLANZapine (ZYPREXA) 15 MG tablet Take 1 tablet (15 mg total) by mouth daily after breakfast. (Patient taking differently: Take 15 mg by mouth 2 (two) times daily.) 30 tablet 0   Olopatadine HCl 0.2 % SOLN Place 1 drop into both eyes daily as needed (for itching). 2.5 mL 5   ondansetron (ZOFRAN ODT) 4 MG disintegrating tablet Take 1 tablet (4 mg total) by mouth every 8 (eight) hours as needed for nausea or vomiting. (Patient not taking:  Reported on 11/06/2020) 20 tablet 0   pantoprazole (PROTONIX) 40 MG tablet Take 40 mg by mouth daily.     phenol (CHLORASEPTIC) 1.4 % LIQD Use as directed 1 spray in the mouth or throat as needed for throat irritation / pain. (Patient not taking: Reported on 11/06/2020)  0   polyethylene glycol (MIRALAX) 17 g packet Take 17 g by mouth daily as needed (constipation). (Patient not taking: Reported on 11/06/2020) 14 each 0   propranolol (INDERAL) 40 MG tablet Take 60 mg by mouth 2 (two) times daily. '60mg'$   in the morning     SALINE MIST 0.65 % nasal spray 2 SPRAYS EACH NOSTRIL TWICE DAILY. (Patient taking differently: Place 2 sprays into the nose in the morning and at bedtime.) 45  mL 5   No current facility-administered medications for this visit.   Allergies: Allergies  Allergen Reactions   Griseofulvin Anaphylaxis, Other (See Comments), Shortness Of Breath and Swelling    Arm swelling Other reaction(s): Other (See Comments) Arm swelling   I reviewed her past medical history, social history, family history, and environmental history and no significant changes have been reported from her previous visit.  ROS: All others negative except as noted per HPI.   Objective: BP 108/80   Pulse 74   Temp 98 F (36.7 C) (Temporal)   Resp (!) 24   Ht '4\' 11"'$  (1.499 m)   Wt 188 lb 9.6 oz (85.5 kg)   SpO2 93%   BMI 38.09 kg/m  Body mass index is 38.09 kg/m. General Appearance:  Alert, cooperative, tearful, appears stated age  Head:  Normocephalic, without obvious abnormality, atraumatic  Eyes:  Conjunctiva clear, EOM's intact  Nose/Ears: Nares normal,  complete obstruction of bilateral nasal passages, hypertrophic turbinates, and septum midline, bulging tympanic membrane on right with pain upon visualization.  Throat: Lips, tongue normal; teeth and gums normal, normal posterior oropharynx and no tonsillar exudate  Neck: Supple, symmetrical  Lungs:   clear to auscultation bilaterally, Respirations  unlabored, no coughing  Heart:  regular rate and rhythm and no murmur, Appears well perfused  Extremities: No edema  Skin: Skin color, texture, turgor normal, no rashes or lesions on visualized portions of skin   Neurologic: No gross deficits   Previous notes and tests were reviewed. The plan was reviewed with the patient/family, and all questions/concerned were addressed.  It was my pleasure to see Sonya Moran today and participate in her care. Please feel free to contact me with any questions or concerns.  Sincerely,  Roney Marion, MD  Allergy & Immunology  [facility] Office: [phone]

## 2021-10-05 NOTE — Addendum Note (Signed)
Addended by: Felipa Emory on: 10/05/2021 10:01 AM   Modules accepted: Orders

## 2021-10-22 DIAGNOSIS — H10413 Chronic giant papillary conjunctivitis, bilateral: Secondary | ICD-10-CM | POA: Diagnosis not present

## 2021-11-20 DIAGNOSIS — R109 Unspecified abdominal pain: Secondary | ICD-10-CM | POA: Diagnosis not present

## 2021-11-20 DIAGNOSIS — N390 Urinary tract infection, site not specified: Secondary | ICD-10-CM | POA: Diagnosis not present

## 2021-11-23 ENCOUNTER — Other Ambulatory Visit: Payer: Self-pay | Admitting: Family Medicine

## 2021-11-27 DIAGNOSIS — E669 Obesity, unspecified: Secondary | ICD-10-CM | POA: Diagnosis not present

## 2021-11-27 DIAGNOSIS — R109 Unspecified abdominal pain: Secondary | ICD-10-CM | POA: Diagnosis not present

## 2021-12-01 DIAGNOSIS — Z6838 Body mass index (BMI) 38.0-38.9, adult: Secondary | ICD-10-CM | POA: Diagnosis not present

## 2021-12-01 DIAGNOSIS — K760 Fatty (change of) liver, not elsewhere classified: Secondary | ICD-10-CM | POA: Diagnosis not present

## 2021-12-01 DIAGNOSIS — K581 Irritable bowel syndrome with constipation: Secondary | ICD-10-CM | POA: Diagnosis not present

## 2021-12-01 DIAGNOSIS — R1084 Generalized abdominal pain: Secondary | ICD-10-CM | POA: Diagnosis not present

## 2021-12-24 DIAGNOSIS — K581 Irritable bowel syndrome with constipation: Secondary | ICD-10-CM | POA: Diagnosis not present

## 2021-12-31 ENCOUNTER — Encounter: Payer: Self-pay | Admitting: Internal Medicine

## 2021-12-31 ENCOUNTER — Ambulatory Visit (INDEPENDENT_AMBULATORY_CARE_PROVIDER_SITE_OTHER): Payer: Medicare Other | Admitting: Internal Medicine

## 2021-12-31 ENCOUNTER — Ambulatory Visit: Payer: Medicare Other | Admitting: Internal Medicine

## 2021-12-31 VITALS — BP 110/70 | HR 70 | Temp 98.2°F | Resp 18

## 2021-12-31 DIAGNOSIS — J3489 Other specified disorders of nose and nasal sinuses: Secondary | ICD-10-CM | POA: Diagnosis not present

## 2021-12-31 DIAGNOSIS — G4733 Obstructive sleep apnea (adult) (pediatric): Secondary | ICD-10-CM | POA: Diagnosis not present

## 2021-12-31 DIAGNOSIS — H1045 Other chronic allergic conjunctivitis: Secondary | ICD-10-CM | POA: Diagnosis not present

## 2021-12-31 DIAGNOSIS — R053 Chronic cough: Secondary | ICD-10-CM

## 2021-12-31 DIAGNOSIS — K219 Gastro-esophageal reflux disease without esophagitis: Secondary | ICD-10-CM

## 2021-12-31 DIAGNOSIS — J310 Chronic rhinitis: Secondary | ICD-10-CM

## 2021-12-31 NOTE — Progress Notes (Unsigned)
Follow Up Note  RE: Sonya Moran MRN: [id number] DOB: 1975/06/25 Date of Office Visit: 12/31/2021  Referring provider: Lucianne Lei, MD Primary care provider: Lucianne Lei, MD  Chief Complaint: Nasal Congestion  History of Present Illness: I had the pleasure of seeing Sonya Moran for a follow up visit at the [facility] [place] on 01/02/2022. She is a 46 y.o. female, who is being followed for mixed rhinitis, chronic cough, sleep apnea, GERD, allergic conjunctivitis, . Her previous allergy office visit was on 10/03/21 with Dr. Edison Pace. Today is a regular follow up visit.  History obtained from patient, chart review and  care giver .  At last visit she was treated for otitis media and rhinitis flare with prednisone.  Today she and her caregiver feel like she is at her baseline.  She is able to use her CPAP at night.  She does have mild chronic nasal congestion on the right worse than left.  Cough is at baseline and well-controlled.  She currently is on levocetirizine, Flonase, montelukast, Astelin, saline rinses.  She has not needed any albuterol nebs.  Denies any adverse effects of medications.  She was recently start on fish oil for hyper triglyceridemia. Her IBS-C medications were changed.    Assessment and Plan: [name] is a 46 y.o. female with: Chronic rhinitis  Refractory chronic cough  OSA (obstructive sleep apnea)  Other chronic allergic conjunctivitis of both eyes  Gastroesophageal reflux disease without esophagitis  Nasal obstruction Plan: Patient Instructions  Mixed rhinitis: at baseline  Continue levocetirizine 5 mg once a day as needed for runny nose.  Continue the nasal Flonase 2 sprays in each nostril once a day for a stuffy nose. Right nostril point the applicator toward the right ear. Left nostril point the applicator toward the left ear Continue montelukast 10 mg once a day Continue azelastine nasal spray 2 sprays twice a day as needed for a runny nose  or sneezing Continue saline nasal rinses as needed for nasal symptoms. Use this before any medicated nasal sprays for best result  Cough Continue albuterol nebs once every 4 hours as needed for cough or wheeze   Sleep apnea Continue to use CPAP machine   Allergic conjunctivitis Continue Pataday (olopatadine 0.2%) eye drops one drop in each eye once a day as needed for red, itchy eyes  Reflux Continue dietary and lifestyle modifications as listed below Continue dexilant  Continue use IBS medications as precribed by GI   Follow up: 3 months   Thank you so much for letting me partake in your care today.  Don't hesitate to reach out if you have any additional concerns!  Roney Marion, MD  [facility] [place] [place] No follow-ups on file.  Meds ordered this encounter  Medications   azelastine (ASTELIN) 0.1 % nasal spray    Sig: Place 2 sprays into both nostrils 2 (two) times daily.    Dispense:  30 mL    Refill:  5   fluticasone (FLONASE) 50 MCG/ACT nasal spray    Sig: Place 2 sprays into both nostrils daily.    Dispense:  16 g    Refill:  5   levocetirizine (XYZAL) 5 MG tablet    Sig: Take 1 tablet (5 mg total) by mouth at bedtime.    Dispense:  30 tablet    Refill:  5   montelukast (SINGULAIR) 10 MG tablet    Sig: TAKE ONE TABLET BY MOUTH EACH EVENING TO  PREVENT COUGH OR WHEEZE    Dispense:  90 tablet    Refill:  1    One refill needs appt for further refills    Lab Orders  No laboratory test(s) ordered today   Diagnostics: None done    Medication List:  Current Outpatient Medications  Medication Sig Dispense Refill   amitriptyline (ELAVIL) 100 MG tablet Take 100 mg by mouth at bedtime.      dexlansoprazole (DEXILANT) 60 MG capsule TAKE 1 CAPSULE BY MOUTH ONCE A DAY. 30 capsule 2   lamoTRIgine (LAMICTAL) 200 MG tablet Take 200 mg by mouth daily.      levothyroxine (SYNTHROID) 25 MCG tablet Take 25 mcg by mouth daily.      LINZESS 145 MCG CAPS  capsule Take 290 mcg by mouth daily.     LORazepam (ATIVAN) 1 MG tablet Take by mouth.     mirabegron ER (MYRBETRIQ) 25 MG TB24 tablet Take 25 mg by mouth daily.     OLANZapine (ZYPREXA) 15 MG tablet Take 1 tablet (15 mg total) by mouth daily after breakfast. (Patient taking differently: Take 15 mg by mouth 2 (two) times daily.) 30 tablet 0   Olopatadine HCl 0.2 % SOLN Place 1 drop into both eyes daily as needed (for itching). 2.5 mL 5   pantoprazole (PROTONIX) 40 MG tablet Take 40 mg by mouth daily.     propranolol (INDERAL) 40 MG tablet Take 60 mg by mouth 2 (two) times daily. '60mg'$   in the morning     SALINE MIST 0.65 % nasal spray 2 SPRAYS EACH NOSTRIL TWICE DAILY. (Patient taking differently: Place 2 sprays into the nose in the morning and at bedtime.) 45 mL 5   albuterol (VENTOLIN HFA) 108 (90 Base) MCG/ACT inhaler Inhale 2 puffs into the lungs every 6 (six) hours as needed for wheezing or shortness of breath. (Patient not taking: Reported on 12/31/2021) 6.7 g 0   azelastine (ASTELIN) 0.1 % nasal spray Place 2 sprays into both nostrils 2 (two) times daily. 30 mL 5   fluticasone (FLONASE) 50 MCG/ACT nasal spray Place 2 sprays into both nostrils daily. 16 g 5   levocetirizine (XYZAL) 5 MG tablet Take 1 tablet (5 mg total) by mouth at bedtime. 30 tablet 5   montelukast (SINGULAIR) 10 MG tablet TAKE ONE TABLET BY MOUTH EACH EVENING TO PREVENT COUGH OR WHEEZE 90 tablet 1   nystatin-triamcinolone ointment (MYCOLOG) Apply 1 application topically 2 (two) times daily. 30 g 3   ondansetron (ZOFRAN ODT) 4 MG disintegrating tablet Take 1 tablet (4 mg total) by mouth every 8 (eight) hours as needed for nausea or vomiting. (Patient not taking: Reported on 12/31/2021) 20 tablet 0   No current facility-administered medications for this visit.   Allergies: Allergies  Allergen Reactions   Griseofulvin Anaphylaxis, Other (See Comments), Shortness Of Breath and Swelling    Arm swelling Other reaction(s):  Other (See Comments) Arm swelling   I reviewed her past medical history, social history, family history, and environmental history and no significant changes have been reported from her previous visit.  ROS: All others negative except as noted per HPI.   Objective: BP 110/70   Pulse 70   Temp 98.2 F (36.8 C) (Temporal)   Resp 18   SpO2 94%  There is no height or weight on file to calculate BMI. General Appearance:  Alert, cooperative, no distress, appears stated age  Head:  Normocephalic, without obvious abnormality, atraumatic  Eyes:  Conjunctiva clear,  EOM's intact  Nose: Nares normal,  erythematous nasal mucosa, edematous nasal mucosa with right worse than left, hypertrophic turbinates, no visible anterior polyps, and septum midline  Throat: Lips, tongue normal; teeth and gums normal, normal posterior oropharynx  Neck: Supple, symmetrical  Lungs:   clear to auscultation bilaterally, Respirations unlabored, no coughing  Heart:  regular rate and rhythm and no murmur, Appears well perfused  Extremities: No edema  Skin: Skin color, texture, turgor normal, no rashes or lesions on visualized portions of skin   Neurologic: No gross deficits   Previous notes and tests were reviewed. The plan was reviewed with the patient/family, and all questions/concerned were addressed.  It was my pleasure to see Sonya Moran today and participate in her care. Please feel free to contact me with any questions or concerns.  Sincerely,  Roney Marion, MD  Allergy & Immunology  [facility] [place] [facility] Office: [phone]

## 2021-12-31 NOTE — Patient Instructions (Addendum)
Mixed rhinitis: at baseline  Continue levocetirizine 5 mg once a day as needed for runny nose.  Continue the nasal Flonase 2 sprays in each nostril once a day for a stuffy nose. Right nostril point the applicator toward the right ear. Left nostril point the applicator toward the left ear Continue montelukast 10 mg once a day Continue azelastine nasal spray 2 sprays twice a day as needed for a runny nose or sneezing Continue saline nasal rinses as needed for nasal symptoms. Use this before any medicated nasal sprays for best result  Cough Continue albuterol nebs once every 4 hours as needed for cough or wheeze   Sleep apnea Continue to use CPAP machine   Allergic conjunctivitis Continue Pataday (olopatadine 0.2%) eye drops one drop in each eye once a day as needed for red, itchy eyes  Reflux Continue dietary and lifestyle modifications as listed below Continue dexilant  Continue use IBS medications as precribed by GI   Follow up: 3 months   Thank you so much for letting me partake in your care today.  Don't hesitate to reach out if you have any additional concerns!  Roney Marion, MD  [facility] [place], [place]

## 2022-01-01 MED ORDER — FLUTICASONE PROPIONATE 50 MCG/ACT NA SUSP
2.0000 | Freq: Every day | NASAL | 5 refills | Status: DC
Start: 2022-01-01 — End: 2022-04-01

## 2022-01-01 MED ORDER — LEVOCETIRIZINE DIHYDROCHLORIDE 5 MG PO TABS
5.0000 mg | ORAL_TABLET | Freq: Every day | ORAL | 5 refills | Status: DC
Start: 2022-01-01 — End: 2022-04-01

## 2022-01-01 MED ORDER — AZELASTINE HCL 0.1 % NA SOLN
2.0000 | Freq: Two times a day (BID) | NASAL | 5 refills | Status: DC
Start: 2022-01-01 — End: 2022-04-01

## 2022-01-01 MED ORDER — MONTELUKAST SODIUM 10 MG PO TABS: ORAL_TABLET | ORAL | 1 refills | Status: DC

## 2022-01-09 DIAGNOSIS — R82998 Other abnormal findings in urine: Secondary | ICD-10-CM | POA: Diagnosis not present

## 2022-01-09 DIAGNOSIS — R140 Abdominal distension (gaseous): Secondary | ICD-10-CM | POA: Diagnosis not present

## 2022-01-09 DIAGNOSIS — R1084 Generalized abdominal pain: Secondary | ICD-10-CM | POA: Diagnosis not present

## 2022-01-09 DIAGNOSIS — Z6838 Body mass index (BMI) 38.0-38.9, adult: Secondary | ICD-10-CM | POA: Diagnosis not present

## 2022-01-09 DIAGNOSIS — G4730 Sleep apnea, unspecified: Secondary | ICD-10-CM | POA: Diagnosis not present

## 2022-01-21 ENCOUNTER — Other Ambulatory Visit: Payer: Self-pay | Admitting: Family Medicine

## 2022-01-21 DIAGNOSIS — K219 Gastro-esophageal reflux disease without esophagitis: Secondary | ICD-10-CM | POA: Diagnosis not present

## 2022-01-21 DIAGNOSIS — Z6838 Body mass index (BMI) 38.0-38.9, adult: Secondary | ICD-10-CM | POA: Diagnosis not present

## 2022-01-21 DIAGNOSIS — G4733 Obstructive sleep apnea (adult) (pediatric): Secondary | ICD-10-CM | POA: Diagnosis not present

## 2022-01-21 NOTE — Telephone Encounter (Signed)
Needs  contact primary doctor not asthma medication

## 2022-01-31 DIAGNOSIS — K760 Fatty (change of) liver, not elsewhere classified: Secondary | ICD-10-CM | POA: Diagnosis not present

## 2022-01-31 DIAGNOSIS — Z7901 Long term (current) use of anticoagulants: Secondary | ICD-10-CM | POA: Diagnosis not present

## 2022-01-31 DIAGNOSIS — R7401 Elevation of levels of liver transaminase levels: Secondary | ICD-10-CM | POA: Diagnosis not present

## 2022-01-31 DIAGNOSIS — K581 Irritable bowel syndrome with constipation: Secondary | ICD-10-CM | POA: Diagnosis not present

## 2022-01-31 DIAGNOSIS — Z6838 Body mass index (BMI) 38.0-38.9, adult: Secondary | ICD-10-CM | POA: Diagnosis not present

## 2022-02-02 DIAGNOSIS — G4761 Periodic limb movement disorder: Secondary | ICD-10-CM | POA: Diagnosis not present

## 2022-02-02 DIAGNOSIS — G4736 Sleep related hypoventilation in conditions classified elsewhere: Secondary | ICD-10-CM | POA: Diagnosis not present

## 2022-02-02 DIAGNOSIS — R0902 Hypoxemia: Secondary | ICD-10-CM | POA: Diagnosis not present

## 2022-02-02 DIAGNOSIS — G4733 Obstructive sleep apnea (adult) (pediatric): Secondary | ICD-10-CM | POA: Diagnosis not present

## 2022-02-14 DIAGNOSIS — Z6837 Body mass index (BMI) 37.0-37.9, adult: Secondary | ICD-10-CM | POA: Diagnosis not present

## 2022-02-14 DIAGNOSIS — K219 Gastro-esophageal reflux disease without esophagitis: Secondary | ICD-10-CM | POA: Diagnosis not present

## 2022-02-14 DIAGNOSIS — G4733 Obstructive sleep apnea (adult) (pediatric): Secondary | ICD-10-CM | POA: Diagnosis not present

## 2022-02-26 ENCOUNTER — Telehealth: Payer: Self-pay | Admitting: Internal Medicine

## 2022-02-26 NOTE — Telephone Encounter (Signed)
Addressed, called pharmacy and pt guardian and matter have been solved. Med's being sent out today.

## 2022-02-26 NOTE — Telephone Encounter (Signed)
Pt's guardian states pt's meds were never received by the pharmacy. The pharmacy is asking that you resend the prescriptions for montelukast, azelastine, fluticasone,and  levocetirizine

## 2022-02-27 DIAGNOSIS — F919 Conduct disorder, unspecified: Secondary | ICD-10-CM | POA: Diagnosis not present

## 2022-02-27 DIAGNOSIS — Z6837 Body mass index (BMI) 37.0-37.9, adult: Secondary | ICD-10-CM | POA: Diagnosis not present

## 2022-03-06 DIAGNOSIS — Z6837 Body mass index (BMI) 37.0-37.9, adult: Secondary | ICD-10-CM | POA: Diagnosis not present

## 2022-03-06 DIAGNOSIS — L259 Unspecified contact dermatitis, unspecified cause: Secondary | ICD-10-CM | POA: Diagnosis not present

## 2022-03-14 DIAGNOSIS — Z6837 Body mass index (BMI) 37.0-37.9, adult: Secondary | ICD-10-CM | POA: Diagnosis not present

## 2022-03-14 DIAGNOSIS — Z79899 Other long term (current) drug therapy: Secondary | ICD-10-CM | POA: Diagnosis not present

## 2022-03-14 DIAGNOSIS — F331 Major depressive disorder, recurrent, moderate: Secondary | ICD-10-CM | POA: Diagnosis not present

## 2022-03-14 DIAGNOSIS — Z1331 Encounter for screening for depression: Secondary | ICD-10-CM | POA: Diagnosis not present

## 2022-03-22 ENCOUNTER — Other Ambulatory Visit: Payer: Self-pay | Admitting: Family Medicine

## 2022-03-25 DIAGNOSIS — R140 Abdominal distension (gaseous): Secondary | ICD-10-CM | POA: Diagnosis not present

## 2022-03-25 DIAGNOSIS — K581 Irritable bowel syndrome with constipation: Secondary | ICD-10-CM | POA: Diagnosis not present

## 2022-03-25 DIAGNOSIS — Z6837 Body mass index (BMI) 37.0-37.9, adult: Secondary | ICD-10-CM | POA: Diagnosis not present

## 2022-03-25 DIAGNOSIS — K760 Fatty (change of) liver, not elsewhere classified: Secondary | ICD-10-CM | POA: Diagnosis not present

## 2022-04-01 ENCOUNTER — Ambulatory Visit (INDEPENDENT_AMBULATORY_CARE_PROVIDER_SITE_OTHER): Payer: Medicare Other | Admitting: Internal Medicine

## 2022-04-01 ENCOUNTER — Encounter: Payer: Self-pay | Admitting: Internal Medicine

## 2022-04-01 VITALS — BP 110/72 | HR 94 | Temp 98.2°F | Resp 20 | Wt 187.4 lb

## 2022-04-01 DIAGNOSIS — K219 Gastro-esophageal reflux disease without esophagitis: Secondary | ICD-10-CM | POA: Diagnosis not present

## 2022-04-01 DIAGNOSIS — H1013 Acute atopic conjunctivitis, bilateral: Secondary | ICD-10-CM

## 2022-04-01 DIAGNOSIS — R058 Other specified cough: Secondary | ICD-10-CM | POA: Diagnosis not present

## 2022-04-01 DIAGNOSIS — J3489 Other specified disorders of nose and nasal sinuses: Secondary | ICD-10-CM | POA: Diagnosis not present

## 2022-04-01 DIAGNOSIS — H1010 Acute atopic conjunctivitis, unspecified eye: Secondary | ICD-10-CM

## 2022-04-01 DIAGNOSIS — J310 Chronic rhinitis: Secondary | ICD-10-CM | POA: Diagnosis not present

## 2022-04-01 DIAGNOSIS — G4733 Obstructive sleep apnea (adult) (pediatric): Secondary | ICD-10-CM | POA: Diagnosis not present

## 2022-04-01 MED ORDER — FLUTICASONE PROPIONATE 50 MCG/ACT NA SUSP: 2.0000 | Freq: Every day | NASAL | 5 refills | Status: DC

## 2022-04-01 MED ORDER — MONTELUKAST SODIUM 10 MG PO TABS: ORAL_TABLET | ORAL | 1 refills | Status: DC

## 2022-04-01 MED ORDER — METHYLPREDNISOLONE ACETATE 40 MG/ML IJ SUSP
40.0000 mg | Freq: Once | INTRAMUSCULAR | Status: AC
  Administered 2022-04-01: 40 mg via INTRAMUSCULAR

## 2022-04-01 MED ORDER — OLOPATADINE HCL 0.2 % OP SOLN: 1.0000 [drp] | Freq: Every day | OPHTHALMIC | 5 refills | Status: AC | PRN

## 2022-04-01 MED ORDER — LEVOCETIRIZINE DIHYDROCHLORIDE 5 MG PO TABS: 5.0000 mg | ORAL_TABLET | Freq: Every day | ORAL | 5 refills | Status: DC

## 2022-04-01 MED ORDER — NYSTATIN-TRIAMCINOLONE 100000-0.1 UNIT/GM-% EX OINT: 1.0000 | TOPICAL_OINTMENT | Freq: Two times a day (BID) | CUTANEOUS | 3 refills | Status: DC

## 2022-04-01 MED ORDER — AZELASTINE HCL 0.1 % NA SOLN: 2.0000 | Freq: Two times a day (BID) | NASAL | 5 refills | Status: DC

## 2022-04-01 NOTE — Patient Instructions (Addendum)
Mixed rhinitis: significant obstruction  Depo medrol 40mg  Im given today Continue levocetirizine 5 mg once a day as needed for runny nose.   Start  the nasal Flonase 1 sprays in each nostril twice day  Continue montelukast 10 mg once a day Continue azelastine nasal spray 2 sprays twice a day  Continue saline nasal rinses as needed for nasal symptoms. Use this before any medicated nasal sprays for best result  Cough Continue albuterol nebs once every 4 hours as needed for cough or wheeze   Sleep apnea Continue to use CPAP machine   Allergic conjunctivitis Continue Pataday (olopatadine 0.2%) eye drops one drop in each eye once a day as needed for red, itchy eyes  Reflux Continue dietary and lifestyle modifications as listed below Continue dexilant  Continue use IBS medications as precribed by GI   Follow up: 3 months   Thank you so much for letting me partake in your care today.  Don't hesitate to reach out if you have any additional concerns!  Roney Marion, MD  [facility] [place], [place]

## 2022-04-01 NOTE — Progress Notes (Unsigned)
Follow Up Note  RE: Sonya Moran MRN: [id number] DOB: 1975/04/27 Date of Office Visit: 04/01/2022  Referring provider: Lucianne Lei, MD Primary care provider: Lucianne Lei, MD  Chief Complaint: Follow-up  History of Present Illness: I had the pleasure of seeing Sonya Moran for a follow up visit at the [facility] on 04/01/2022. She is a 47 y.o. female, who is being followed for mixed rhinitis, chronic cough, sleep apnea, GERD, allergic conjunctivitis, . Her previous allergy office visit was on 12/31/21 with Dr. Edison Moran. Today is a regular follow up visit.  History obtained from patient, chart review and  care giver .    Today she and her caregiver feel like she is at her baseline.  She is able to use her CPAP at night, but is getting a new machine due to discomfort with mask.   She does have mild chronic nasal congestion on the right worse than left.  Cough is at baseline and well-controlled.  She currently is on levocetirizine, Flonase, montelukast, Astelin, saline rinses.  She has needed any albuterol nebs once time since last visit.  Denies any adverse effects of medications.  Once course of antibiotics since last visit for injection on her finger.   Complaining of right ear pain  Assessment and Plan: Sonya Moran is a 47 y.o. female with: Mixed rhinitis  Nasal obstruction  OSA on CPAP  Other cough  Seasonal allergic conjunctivitis  Gastroesophageal reflux disease without esophagitis Plan: Patient Instructions  Mixed rhinitis: significant obstruction  Depo medrol 40mg  Im given today Continue levocetirizine 5 mg once a day as needed for runny nose.   Start  the nasal Flonase 1 sprays in each nostril twice day  Continue montelukast 10 mg once a day Continue azelastine nasal spray 2 sprays twice a day  Continue saline nasal rinses as needed for nasal symptoms. Use this before any medicated nasal sprays for best result  Cough Continue albuterol nebs once every  4 hours as needed for cough or wheeze   Sleep apnea Continue to use CPAP machine   Allergic conjunctivitis Continue Pataday (olopatadine 0.2%) eye drops one drop in each eye once a day as needed for red, itchy eyes  Reflux Continue dietary and lifestyle modifications as listed below Continue dexilant  Continue use IBS medications as precribed by GI   Follow up: 3 months   Thank you so much for letting me partake in your care today.  Don't hesitate to reach out if you have any additional concerns!  Sonya Marion, MD  [facility] [place] [place] No follow-ups on file.  Meds ordered this encounter  Medications   azelastine (ASTELIN) 0.1 % nasal spray    Sig: Place 2 sprays into both nostrils 2 (two) times daily.    Dispense:  30 mL    Refill:  5   fluticasone (FLONASE) 50 MCG/ACT nasal spray    Sig: Place 2 sprays into both nostrils daily.    Dispense:  16 g    Refill:  5   levocetirizine (XYZAL) 5 MG tablet    Sig: Take 1 tablet (5 mg total) by mouth at bedtime.    Dispense:  30 tablet    Refill:  5   montelukast (SINGULAIR) 10 MG tablet    Sig: TAKE ONE TABLET BY MOUTH EACH EVENING TO PREVENT COUGH OR WHEEZE    Dispense:  90 tablet    Refill:  1    One refill needs  appt for further refills   Olopatadine HCl 0.2 % SOLN    Sig: Place 1 drop into both eyes daily as needed (for itching).    Dispense:  2.5 mL    Refill:  5   nystatin-triamcinolone ointment (MYCOLOG)    Sig: Apply 1 Application topically 2 (two) times daily.    Dispense:  30 g    Refill:  3   methylPREDNISolone acetate (DEPO-MEDROL) injection 40 mg    Lab Orders  No laboratory test(s) ordered today   Diagnostics: None done    Medication List:  Current Outpatient Medications  Medication Sig Dispense Refill   amitriptyline (ELAVIL) 100 MG tablet Take 100 mg by mouth at bedtime.      dexlansoprazole (DEXILANT) 60 MG capsule TAKE 1 CAPSULE BY MOUTH ONCE A DAY. 30 capsule 11    lamoTRIgine (LAMICTAL) 200 MG tablet Take 200 mg by mouth daily.      levothyroxine (SYNTHROID) 25 MCG tablet TAKE (1) TABLET BY MOUTH ONCE DAILY ON AN EMPTY STOMACH. DO NOT EAT FOR 30 MINS AFTER TAKING MED. 30 tablet 0   LINZESS 145 MCG CAPS capsule Take 290 mcg by mouth daily.     LORazepam (ATIVAN) 1 MG tablet Take by mouth.     mirabegron ER (MYRBETRIQ) 25 MG TB24 tablet Take 25 mg by mouth daily.     OLANZapine (ZYPREXA) 15 MG tablet Take 1 tablet (15 mg total) by mouth daily after breakfast. (Patient taking differently: Take 15 mg by mouth 2 (two) times daily.) 30 tablet 0   pantoprazole (PROTONIX) 40 MG tablet Take 40 mg by mouth daily.     propranolol (INDERAL) 40 MG tablet Take 60 mg by mouth 2 (two) times daily. 60mg   in the morning     SALINE MIST 0.65 % nasal spray 2 SPRAYS EACH NOSTRIL TWICE DAILY. (Patient taking differently: Place 2 sprays into the nose in the morning and at bedtime.) 45 mL 5   albuterol (VENTOLIN HFA) 108 (90 Base) MCG/ACT inhaler Inhale 2 puffs into the lungs every 6 (six) hours as needed for wheezing or shortness of breath. (Patient not taking: Reported on 04/01/2022) 6.7 g 0   azelastine (ASTELIN) 0.1 % nasal spray Place 2 sprays into both nostrils 2 (two) times daily. 30 mL 5   fluticasone (FLONASE) 50 MCG/ACT nasal spray Place 2 sprays into both nostrils daily. 16 g 5   levocetirizine (XYZAL) 5 MG tablet Take 1 tablet (5 mg total) by mouth at bedtime. 30 tablet 5   montelukast (SINGULAIR) 10 MG tablet TAKE ONE TABLET BY MOUTH EACH EVENING TO PREVENT COUGH OR WHEEZE 90 tablet 1   nystatin-triamcinolone ointment (MYCOLOG) Apply 1 Application topically 2 (two) times daily. 30 g 3   Olopatadine HCl 0.2 % SOLN Place 1 drop into both eyes daily as needed (for itching). 2.5 mL 5   ondansetron (ZOFRAN ODT) 4 MG disintegrating tablet Take 1 tablet (4 mg total) by mouth every 8 (eight) hours as needed for nausea or vomiting. (Patient not taking: Reported on 12/31/2021) 20  tablet 0   Current Facility-Administered Medications  Medication Dose Route Frequency Provider Last Rate Last Admin   methylPREDNISolone acetate (DEPO-MEDROL) injection 40 mg  40 mg Intramuscular Once Sonya Marion, MD       Allergies: Allergies  Allergen Reactions   Griseofulvin Anaphylaxis, Other (See Comments), Shortness Of Breath and Swelling    Arm swelling Other reaction(s): Other (See Comments) Arm swelling   I reviewed her past  medical history, social history, family history, and environmental history and no significant changes have been reported from her previous visit.  ROS: All others negative except as noted per HPI.   Objective: BP 110/72   Pulse 94   Temp 98.2 F (36.8 C) (Temporal)   Resp 20   Wt 187 lb 6.4 oz (85 kg)   SpO2 97%   BMI 37.85 kg/m  Body mass index is 37.85 kg/m. General Appearance:  Alert, cooperative, no distress, appears stated age  Head:  Normocephalic, without obvious abnormality, atraumatic  Eyes:  Conjunctiva clear, EOM's intact Ears with cerumen bilaterally, TM- normal   Nose: Nares normal,  erythematous nasal mucosa, edematous nasal mucosa with right fully obstructedt, hypertrophic turbinates, no visible anterior polyps, and septum midline  Throat: Lips, tongue normal; teeth and gums normal, normal posterior oropharynx  Neck: Supple, symmetrical  Lungs:   clear to auscultation bilaterally, Respirations unlabored, no coughing  Heart:  regular rate and rhythm and no murmur, Appears well perfused  Extremities: No edema  Skin: Skin color, texture, turgor normal, no rashes or lesions on visualized portions of skin   Neurologic: No gross deficits   Previous notes and tests were reviewed. The plan was reviewed with the patient/family, and all questions/concerned were addressed.  It was my pleasure to see Montavia today and participate in her care. Please feel free to contact me with any questions or concerns.  Sincerely,  Sonya Marion, MD  Allergy & Immunology  [facility] [place] Office: [phone]

## 2022-04-15 ENCOUNTER — Telehealth: Payer: Self-pay | Admitting: Internal Medicine

## 2022-04-15 NOTE — Telephone Encounter (Signed)
Patient's caregiver states pharmacy won't dispense dexlansoprazole until they have an explanation of why she still needs to be on it.

## 2022-04-15 NOTE — Telephone Encounter (Signed)
Spoke with Amy at [facility] PA is needed for dexilant for reflux for pt thank you

## 2022-04-16 ENCOUNTER — Telehealth: Payer: Self-pay

## 2022-04-16 NOTE — Telephone Encounter (Signed)
PA request received via CMM for Dexilant 60MG  dr capsules  PA has been submitted to [facility] and is pending determination.   Key: [id number]

## 2022-04-16 NOTE — Telephone Encounter (Signed)
PA has been submitted and is pending determination, will be updated in additional encounter created. 

## 2022-04-16 NOTE — Telephone Encounter (Signed)
Pa denied for dexlansoprazole pts insurance prefers esomeprzole, lansorazole capsules, pantoprazole tablet.

## 2022-04-17 NOTE — Telephone Encounter (Signed)
Patient Advocate Encounter   Received notification from [facility] that prior authorization is required for Dexilant 60MG  dr capsules  Original request had question incorrectly answered  Resubmitted: 04-17-2022 Key [id number]  Status is pending

## 2022-04-19 NOTE — Telephone Encounter (Signed)
Additional information request received and has been faced to plan.

## 2022-04-22 ENCOUNTER — Other Ambulatory Visit: Payer: Self-pay | Admitting: Family Medicine

## 2022-04-22 NOTE — Telephone Encounter (Signed)
PA has been APPROVED from 04/19/2022 until further notice under [facility] Medicare

## 2022-04-23 DIAGNOSIS — H10413 Chronic giant papillary conjunctivitis, bilateral: Secondary | ICD-10-CM | POA: Diagnosis not present

## 2022-04-29 ENCOUNTER — Telehealth: Payer: Self-pay | Admitting: *Deleted

## 2022-04-29 NOTE — Telephone Encounter (Signed)
Returned call from 2:52 PM. Spoke with Sonya Moran that patient has an appointment on 04/30/2022 with PCP. Advised to schedule with GYN if PCP needs her to F/U with GYN. Agreed.

## 2022-04-30 DIAGNOSIS — K5900 Constipation, unspecified: Secondary | ICD-10-CM | POA: Diagnosis not present

## 2022-04-30 DIAGNOSIS — N939 Abnormal uterine and vaginal bleeding, unspecified: Secondary | ICD-10-CM | POA: Diagnosis not present

## 2022-04-30 DIAGNOSIS — R1084 Generalized abdominal pain: Secondary | ICD-10-CM | POA: Diagnosis not present

## 2022-04-30 DIAGNOSIS — Z6837 Body mass index (BMI) 37.0-37.9, adult: Secondary | ICD-10-CM | POA: Diagnosis not present

## 2022-04-30 DIAGNOSIS — N3090 Cystitis, unspecified without hematuria: Secondary | ICD-10-CM | POA: Diagnosis not present

## 2022-04-30 DIAGNOSIS — R81 Glycosuria: Secondary | ICD-10-CM | POA: Diagnosis not present

## 2022-05-07 ENCOUNTER — Telehealth: Payer: Self-pay | Admitting: *Deleted

## 2022-05-07 ENCOUNTER — Encounter: Payer: Self-pay | Admitting: *Deleted

## 2022-05-07 NOTE — Telephone Encounter (Signed)
Left Sonya Moran a message to call the office to schedule referral appointment from [facility] for vaginal bleeding.

## 2022-05-09 DIAGNOSIS — F411 Generalized anxiety disorder: Secondary | ICD-10-CM | POA: Diagnosis not present

## 2022-05-09 DIAGNOSIS — Z6838 Body mass index (BMI) 38.0-38.9, adult: Secondary | ICD-10-CM | POA: Diagnosis not present

## 2022-05-09 DIAGNOSIS — F331 Major depressive disorder, recurrent, moderate: Secondary | ICD-10-CM | POA: Diagnosis not present

## 2022-05-09 DIAGNOSIS — Z79899 Other long term (current) drug therapy: Secondary | ICD-10-CM | POA: Diagnosis not present

## 2022-05-16 DIAGNOSIS — Z6838 Body mass index (BMI) 38.0-38.9, adult: Secondary | ICD-10-CM | POA: Diagnosis not present

## 2022-05-16 DIAGNOSIS — K589 Irritable bowel syndrome without diarrhea: Secondary | ICD-10-CM | POA: Diagnosis not present

## 2022-05-16 DIAGNOSIS — F331 Major depressive disorder, recurrent, moderate: Secondary | ICD-10-CM | POA: Diagnosis not present

## 2022-05-16 DIAGNOSIS — G4733 Obstructive sleep apnea (adult) (pediatric): Secondary | ICD-10-CM | POA: Diagnosis not present

## 2022-05-16 DIAGNOSIS — F411 Generalized anxiety disorder: Secondary | ICD-10-CM | POA: Diagnosis not present

## 2022-06-07 DIAGNOSIS — Z6838 Body mass index (BMI) 38.0-38.9, adult: Secondary | ICD-10-CM | POA: Diagnosis not present

## 2022-06-07 DIAGNOSIS — Z760 Encounter for issue of repeat prescription: Secondary | ICD-10-CM | POA: Diagnosis not present

## 2022-06-07 DIAGNOSIS — F411 Generalized anxiety disorder: Secondary | ICD-10-CM | POA: Diagnosis not present

## 2022-06-07 DIAGNOSIS — D696 Thrombocytopenia, unspecified: Secondary | ICD-10-CM | POA: Diagnosis not present

## 2022-06-07 DIAGNOSIS — D649 Anemia, unspecified: Secondary | ICD-10-CM | POA: Diagnosis not present

## 2022-06-07 DIAGNOSIS — N390 Urinary tract infection, site not specified: Secondary | ICD-10-CM | POA: Diagnosis not present

## 2022-06-07 DIAGNOSIS — Z79899 Other long term (current) drug therapy: Secondary | ICD-10-CM | POA: Diagnosis not present

## 2022-06-07 DIAGNOSIS — F331 Major depressive disorder, recurrent, moderate: Secondary | ICD-10-CM | POA: Diagnosis not present

## 2022-06-12 ENCOUNTER — Other Ambulatory Visit: Payer: Self-pay | Admitting: Family Medicine

## 2022-06-12 NOTE — Telephone Encounter (Signed)
Should this be refilled?  

## 2022-06-14 NOTE — Telephone Encounter (Signed)
This was a courtesy refill from [facility] in April.  We cannot continue to refill medication as out of our scope.  I recommend sending this refill request from now out to PCP.  But it in mean time can give 30 day courtesy refill, but wont do it again.  Thanks!

## 2022-06-14 NOTE — Telephone Encounter (Signed)
Sounds good. Thank you

## 2022-06-19 ENCOUNTER — Other Ambulatory Visit: Payer: Self-pay

## 2022-06-19 MED ORDER — LEVOCETIRIZINE DIHYDROCHLORIDE 5 MG PO TABS: 5.0000 mg | ORAL_TABLET | Freq: Every day | ORAL | 5 refills | Status: DC

## 2022-06-19 MED ORDER — DEXLANSOPRAZOLE 60 MG PO CPDR: 1.0000 | DELAYED_RELEASE_CAPSULE | Freq: Every day | ORAL | 11 refills | Status: AC

## 2022-06-19 MED ORDER — AZELASTINE HCL 0.1 % NA SOLN: 2.0000 | Freq: Two times a day (BID) | NASAL | 5 refills | Status: DC

## 2022-06-19 MED ORDER — MONTELUKAST SODIUM 10 MG PO TABS: ORAL_TABLET | ORAL | 1 refills | Status: DC

## 2022-06-19 MED ORDER — FLUTICASONE PROPIONATE 50 MCG/ACT NA SUSP: 2.0000 | Freq: Every day | NASAL | 5 refills | Status: DC

## 2022-07-02 ENCOUNTER — Encounter: Payer: Self-pay | Admitting: Internal Medicine

## 2022-07-02 ENCOUNTER — Other Ambulatory Visit: Payer: Self-pay

## 2022-07-02 ENCOUNTER — Ambulatory Visit (INDEPENDENT_AMBULATORY_CARE_PROVIDER_SITE_OTHER): Payer: Medicare Other | Admitting: Internal Medicine

## 2022-07-02 VITALS — BP 132/78 | HR 96 | Temp 97.9°F | Resp 22 | Wt 189.9 lb

## 2022-07-02 DIAGNOSIS — G4733 Obstructive sleep apnea (adult) (pediatric): Secondary | ICD-10-CM

## 2022-07-02 DIAGNOSIS — J310 Chronic rhinitis: Secondary | ICD-10-CM | POA: Diagnosis not present

## 2022-07-02 DIAGNOSIS — K219 Gastro-esophageal reflux disease without esophagitis: Secondary | ICD-10-CM | POA: Diagnosis not present

## 2022-07-02 DIAGNOSIS — R058 Other specified cough: Secondary | ICD-10-CM

## 2022-07-02 DIAGNOSIS — H1045 Other chronic allergic conjunctivitis: Secondary | ICD-10-CM

## 2022-07-02 DIAGNOSIS — M7989 Other specified soft tissue disorders: Secondary | ICD-10-CM

## 2022-07-02 DIAGNOSIS — T148XXA Other injury of unspecified body region, initial encounter: Secondary | ICD-10-CM

## 2022-07-02 DIAGNOSIS — L308 Other specified dermatitis: Secondary | ICD-10-CM

## 2022-07-02 DIAGNOSIS — L0100 Impetigo, unspecified: Secondary | ICD-10-CM | POA: Diagnosis not present

## 2022-07-02 MED ORDER — IPRATROPIUM BROMIDE 0.06 % NA SOLN: 2.0000 | Freq: Four times a day (QID) | NASAL | 12 refills | Status: DC

## 2022-07-02 MED ORDER — FLUTICASONE PROPIONATE 50 MCG/ACT NA SUSP: 2.0000 | Freq: Every day | NASAL | 5 refills | Status: DC

## 2022-07-02 MED ORDER — AZELASTINE HCL 0.1 % NA SOLN: 2.0000 | Freq: Two times a day (BID) | NASAL | 5 refills | Status: DC

## 2022-07-02 MED ORDER — MUPIROCIN 2 % EX OINT: 1.0000 | TOPICAL_OINTMENT | Freq: Two times a day (BID) | CUTANEOUS | 0 refills | Status: DC

## 2022-07-02 MED ORDER — SALINE NASAL SPRAY 0.65 % NA SOLN: 2.0000 | Freq: Two times a day (BID) | NASAL | 3 refills | Status: DC

## 2022-07-02 NOTE — Progress Notes (Unsigned)
Follow Up Note  RE: Sonya Moran MRN: [id number] DOB: [birth year] Date of Office Visit: 07/02/2022  Referring provider: Renaye Rakers, MD Primary care provider: Renaye Rakers, MD  Chief Complaint: Follow-up and Sinus Problem (Runny nose)  History of Present Illness: I had the pleasure of seeing Sonya Moran for a follow up visit at the [facility] on 07/03/2022. She is a 47 y.o. female, who is being followed for mixed rhinitis, chronic cough, sleep apnea, GERD, allergic conjunctivitis, . Her previous allergy office visit was on 04/01/22 with Dr. Marlynn Perking. Today is a regular follow up visit.  History obtained from patient, chart review and  care giver .  At last visit she was treated with Depo-Medrol 40 mg IM for severe nasal obstruction.  Started on Flonase 1 spray per nostril daily  Today she and her caregiver feel like she is at her baseline.  She is able to use her CPAP at night, with new mask.  Increased runny nose.   Cough is well controlled. Using albuterol nebs 1-2 times per month   She currently is on levocetirizine, Flonase, montelukast, Astelin, saline rinses.    New concern for itchy rash on bilateral legs and erythema on right hand.  Laporsche admits to hitting hand on door a few days ago.   She has had UTI which required antibiotics 2 times since last visit.    Complaining on sore in left nostril   Also caregiver noticed that left leg is slightly swollen compared to right  Assessment and Plan: Nirvana is a 47 y.o. female with: Mixed rhinitis  OSA on CPAP  Other cough  Other chronic allergic conjunctivitis of both eyes  Gastroesophageal reflux disease, unspecified whether esophagitis present  Bruising  Other specified dermatitis  Left leg swelling  Impetigo Plan: Patient Instructions  Mixed rhinitis: with impetigo  Start mupirocin ointment twice a day in red sore patches inside of nose until clears  Start -Atrovent (ipatopium) nasal spray 1-2  sprays in each nostril up to three times daily AS NEEDED for POST NASAL DRIP/RUNNY NOSE/DRAINAGE.  If you become too dry, use less often. Continue levocetirizine 5 mg once a day as needed for runny nose.  Continue Flonase 1 sprays in each nostril twice day  Continue montelukast 10 mg once a day Continue azelastine nasal spray 2 sprays twice a day  Continue saline nasal rinses as needed for nasal symptoms. Use this before any medicated nasal sprays for best result   Rash on legs  Start triamcinolone ointment twice a day as needed    Right Hand  -suspect swelling from recent trauma - can use ice as needed for swelling  - if pain worsens consider getting xrays, but low suspicion for fracture at this time    Left leg swelling  - monitor swelling, if worsens or associated with pain needs to be addresses - Agree with follow up with PCP to work up further   Cough: controlled  Continue albuterol nebs once every 4 hours as needed for cough or wheeze   Sleep apnea Continue to use CPAP machine   Allergic conjunctivitis Continue Pataday (olopatadine 0.2%) eye drops one drop in each eye once a day as needed for red, itchy eyes  Reflux Continue dietary and lifestyle modifications as listed below Continue dexilant  Continue use IBS medications as precribed by GI   Follow up: 3 months   Thank you so much for letting me partake in your care today.  Don't hesitate to reach out if you have any additional concerns!  Sonya Luz, MD  [facility] [place] [place] No follow-ups on file.  Meds ordered this encounter  Medications   azelastine (ASTELIN) 0.1 % nasal spray    Sig: Place 2 sprays into both nostrils 2 (two) times daily.    Dispense:  30 mL    Refill:  5   fluticasone (FLONASE) 50 MCG/ACT nasal spray    Sig: Place 2 sprays into both nostrils daily.    Dispense:  16 g    Refill:  5   sodium chloride (SALINE MIST) 0.65 % nasal spray    Sig: Place 2 sprays into  the nose in the morning and at bedtime.    Dispense:  15 mL    Refill:  3   ipratropium (ATROVENT) 0.06 % nasal spray    Sig: Place 2 sprays into both nostrils 4 (four) times daily.    Dispense:  15 mL    Refill:  12   mupirocin ointment (BACTROBAN) 2 %    Sig: Apply 1 Application topically 2 (two) times daily.    Dispense:  22 g    Refill:  0    Lab Orders  No laboratory test(s) ordered today   Diagnostics: None done    Medication List:  Current Outpatient Medications  Medication Sig Dispense Refill   amitriptyline (ELAVIL) 100 MG tablet Take 100 mg by mouth at bedtime.      dexlansoprazole (DEXILANT) 60 MG capsule Take 1 capsule (60 mg total) by mouth daily. 30 capsule 11   ipratropium (ATROVENT) 0.06 % nasal spray Place 2 sprays into both nostrils 4 (four) times daily. 15 mL 12   lamoTRIgine (LAMICTAL) 200 MG tablet Take 200 mg by mouth daily.      levocetirizine (XYZAL) 5 MG tablet Take 1 tablet (5 mg total) by mouth at bedtime. 30 tablet 5   levothyroxine (SYNTHROID) 25 MCG tablet TAKE (1) TABLET BY MOUTH ONCE DAILY ON AN EMPTY STOMACH. DO NOT EAT FOR 30 MINS AFTER TAKING MED. 30 tablet 0   LINZESS 145 MCG CAPS capsule Take 290 mcg by mouth daily.     LORazepam (ATIVAN) 1 MG tablet Take by mouth.     mirabegron ER (MYRBETRIQ) 25 MG TB24 tablet Take 25 mg by mouth daily.     montelukast (SINGULAIR) 10 MG tablet TAKE ONE TABLET BY MOUTH EACH EVENING TO PREVENT COUGH OR WHEEZE 90 tablet 1   mupirocin ointment (BACTROBAN) 2 % Apply 1 Application topically 2 (two) times daily. 22 g 0   nystatin-triamcinolone ointment (MYCOLOG) Apply 1 Application topically 2 (two) times daily. 30 g 3   Olopatadine HCl 0.2 % SOLN Place 1 drop into both eyes daily as needed (for itching). 2.5 mL 5   pantoprazole (PROTONIX) 40 MG tablet Take 40 mg by mouth daily.     propranolol (INDERAL) 40 MG tablet Take 60 mg by mouth 2 (two) times daily. 60mg   in the morning     TRULANCE 3 MG TABS Take 1 tablet  by mouth daily.     albuterol (VENTOLIN HFA) 108 (90 Base) MCG/ACT inhaler Inhale 2 puffs into the lungs every 6 (six) hours as needed for wheezing or shortness of breath. (Patient not taking: Reported on 04/01/2022) 6.7 g 0   azelastine (ASTELIN) 0.1 % nasal spray Place 2 sprays into both nostrils 2 (two) times daily. 30 mL 5   fluticasone (FLONASE) 50 MCG/ACT nasal  spray Place 2 sprays into both nostrils daily. 16 g 5   OLANZapine (ZYPREXA) 15 MG tablet Take 1 tablet (15 mg total) by mouth daily after breakfast. (Patient not taking: Reported on 07/02/2022) 30 tablet 0   ondansetron (ZOFRAN ODT) 4 MG disintegrating tablet Take 1 tablet (4 mg total) by mouth every 8 (eight) hours as needed for nausea or vomiting. (Patient not taking: Reported on 12/31/2021) 20 tablet 0   sodium chloride (SALINE MIST) 0.65 % nasal spray Place 2 sprays into the nose in the morning and at bedtime. 15 mL 3   No current facility-administered medications for this visit.   Allergies: Allergies  Allergen Reactions   Griseofulvin Anaphylaxis, Other (See Comments), Shortness Of Breath and Swelling    Arm swelling Other reaction(s): Other (See Comments) Arm swelling   I reviewed her past medical history, social history, family history, and environmental history and no significant changes have been reported from her previous visit.  ROS: All others negative except as noted per HPI.   Objective: BP 132/78 (BP Location: Left Arm, Patient Position: Sitting, Cuff Size: Normal)   Pulse 96   Temp 97.9 F (36.6 C) (Temporal)   Resp (!) 22   Wt 189 lb 14.4 oz (86.1 kg)   SpO2 94%   BMI 38.36 kg/m  Body mass index is 38.36 kg/m. General Appearance:  Alert, cooperative, no distress, appears stated age  Head:  Normocephalic, without obvious abnormality, atraumatic  Eyes:  Conjunctiva clear, EOM's intact Ears with cerumen bilaterally, TM- normal   Nose: Nares normal,  erythematous nasal mucosa, edematous nasal mucosa ,  hypertrophic turbinates, no visible anterior polyps, and septum midline, erythema in left  nares   Throat: Lips, tongue normal; teeth and gums normal, normal posterior oropharynx  Neck: Supple, symmetrical  Lungs:   clear to auscultation bilaterally, Respirations unlabored, no coughing  Heart:  regular rate and rhythm and no murmur, Appears well perfused  Extremities: Non pitting edema of left ankle, non tender with calf squeze; right hand with mild erythema and bruising, able to move wrist and all fingers without pain    Skin: Skin color, texture, turgor normal, excoriation marks on bilateral shins,   Neurologic: No gross deficits   Previous notes and tests were reviewed. The plan was reviewed with the patient/family, and all questions/concerned were addressed.  It was my pleasure to see Sonya Moran today and participate in her care. Please feel free to contact me with any questions or concerns.  Sincerely,  Sonya Luz, MD  Allergy & Immunology  [facility] Office: [phone]

## 2022-07-02 NOTE — Patient Instructions (Addendum)
Mixed rhinitis: with impetigo  Start mupirocin ointment twice a day in red sore patches inside of nose until clears  Start -Atrovent (ipatopium) nasal spray 1-2 sprays in each nostril up to three times daily AS NEEDED for POST NASAL DRIP/RUNNY NOSE/DRAINAGE.  If you become too dry, use less often. Continue levocetirizine 5 mg once a day as needed for runny nose.  Continue Flonase 1 sprays in each nostril twice day  Continue montelukast 10 mg once a day Continue azelastine nasal spray 2 sprays twice a day  Continue saline nasal rinses as needed for nasal symptoms. Use this before any medicated nasal sprays for best result   Rash on legs  Start triamcinolone ointment twice a day as needed    Right Hand  -suspect swelling from recent trauma - can use ice as needed for swelling  - if pain worsens consider getting xrays, but low suspicion for fracture at this time    Left leg swelling  - monitor swelling, if worsens or associated with pain needs to be addresses - Agree with follow up with PCP to work up further   Cough: controlled  Continue albuterol nebs once every 4 hours as needed for cough or wheeze   Sleep apnea Continue to use CPAP machine   Allergic conjunctivitis Continue Pataday (olopatadine 0.2%) eye drops one drop in each eye once a day as needed for red, itchy eyes  Reflux Continue dietary and lifestyle modifications as listed below Continue dexilant  Continue use IBS medications as precribed by GI   Follow up: 3 months   Thank you so much for letting me partake in your care today.  Don't hesitate to reach out if you have any additional concerns!  Ferol Luz, MD  [facility] [place] [place]

## 2022-07-05 DIAGNOSIS — M79605 Pain in left leg: Secondary | ICD-10-CM | POA: Diagnosis not present

## 2022-07-05 DIAGNOSIS — Z79899 Other long term (current) drug therapy: Secondary | ICD-10-CM | POA: Diagnosis not present

## 2022-07-05 DIAGNOSIS — Z6838 Body mass index (BMI) 38.0-38.9, adult: Secondary | ICD-10-CM | POA: Diagnosis not present

## 2022-07-05 DIAGNOSIS — N390 Urinary tract infection, site not specified: Secondary | ICD-10-CM | POA: Diagnosis not present

## 2022-07-05 DIAGNOSIS — M1712 Unilateral primary osteoarthritis, left knee: Secondary | ICD-10-CM | POA: Diagnosis not present

## 2022-07-05 DIAGNOSIS — F331 Major depressive disorder, recurrent, moderate: Secondary | ICD-10-CM | POA: Diagnosis not present

## 2022-07-05 DIAGNOSIS — F411 Generalized anxiety disorder: Secondary | ICD-10-CM | POA: Diagnosis not present

## 2022-07-11 ENCOUNTER — Telehealth: Payer: Self-pay

## 2022-07-11 NOTE — Telephone Encounter (Signed)
Patient is in a group home and the caregiver needing new instructions to be applied on the medications that the patient is currently taking. I called [facility] the 3 rx's needed to be rewritten to say how the patient is actually taking her medications.  1st prescription mupirocin ointment apply topically times daily up to 10 days. 2cd  prescription Fluticasone nasal spray one  spray per nostril twice daily or as needed.  . 3rd prescription Ipratropium 0.06% 1-2 sprays per nostril up to 3 times daily as needed. The pharmacist will rewrite the prescription instructions to the above medications.

## 2022-07-12 DIAGNOSIS — N1330 Unspecified hydronephrosis: Secondary | ICD-10-CM | POA: Diagnosis not present

## 2022-07-12 DIAGNOSIS — D696 Thrombocytopenia, unspecified: Secondary | ICD-10-CM | POA: Diagnosis not present

## 2022-07-15 DIAGNOSIS — D649 Anemia, unspecified: Secondary | ICD-10-CM | POA: Diagnosis not present

## 2022-07-17 DIAGNOSIS — M1712 Unilateral primary osteoarthritis, left knee: Secondary | ICD-10-CM | POA: Diagnosis not present

## 2022-07-31 DIAGNOSIS — M1611 Unilateral primary osteoarthritis, right hip: Secondary | ICD-10-CM | POA: Diagnosis not present

## 2022-07-31 DIAGNOSIS — M25551 Pain in right hip: Secondary | ICD-10-CM | POA: Diagnosis not present

## 2022-07-31 DIAGNOSIS — Z6838 Body mass index (BMI) 38.0-38.9, adult: Secondary | ICD-10-CM | POA: Diagnosis not present

## 2022-08-16 DIAGNOSIS — Z6839 Body mass index (BMI) 39.0-39.9, adult: Secondary | ICD-10-CM | POA: Diagnosis not present

## 2022-08-16 DIAGNOSIS — F331 Major depressive disorder, recurrent, moderate: Secondary | ICD-10-CM | POA: Diagnosis not present

## 2022-08-16 DIAGNOSIS — G4733 Obstructive sleep apnea (adult) (pediatric): Secondary | ICD-10-CM | POA: Diagnosis not present

## 2022-08-16 DIAGNOSIS — K760 Fatty (change of) liver, not elsewhere classified: Secondary | ICD-10-CM | POA: Diagnosis not present

## 2022-08-21 DIAGNOSIS — G8929 Other chronic pain: Secondary | ICD-10-CM | POA: Diagnosis not present

## 2022-08-21 DIAGNOSIS — M25562 Pain in left knee: Secondary | ICD-10-CM | POA: Diagnosis not present

## 2022-08-21 DIAGNOSIS — M1712 Unilateral primary osteoarthritis, left knee: Secondary | ICD-10-CM | POA: Diagnosis not present

## 2022-09-02 DIAGNOSIS — E781 Pure hyperglyceridemia: Secondary | ICD-10-CM | POA: Diagnosis not present

## 2022-09-02 DIAGNOSIS — R5383 Other fatigue: Secondary | ICD-10-CM | POA: Diagnosis not present

## 2022-09-02 DIAGNOSIS — R062 Wheezing: Secondary | ICD-10-CM | POA: Diagnosis not present

## 2022-09-02 DIAGNOSIS — E6609 Other obesity due to excess calories: Secondary | ICD-10-CM | POA: Diagnosis not present

## 2022-09-02 DIAGNOSIS — Z79899 Other long term (current) drug therapy: Secondary | ICD-10-CM | POA: Diagnosis not present

## 2022-09-02 DIAGNOSIS — E559 Vitamin D deficiency, unspecified: Secondary | ICD-10-CM | POA: Diagnosis not present

## 2022-09-02 DIAGNOSIS — D539 Nutritional anemia, unspecified: Secondary | ICD-10-CM | POA: Diagnosis not present

## 2022-09-02 DIAGNOSIS — Z6837 Body mass index (BMI) 37.0-37.9, adult: Secondary | ICD-10-CM | POA: Diagnosis not present

## 2022-09-04 DIAGNOSIS — R339 Retention of urine, unspecified: Secondary | ICD-10-CM | POA: Diagnosis not present

## 2022-09-04 DIAGNOSIS — D649 Anemia, unspecified: Secondary | ICD-10-CM | POA: Diagnosis not present

## 2022-09-04 DIAGNOSIS — N1330 Unspecified hydronephrosis: Secondary | ICD-10-CM | POA: Diagnosis not present

## 2022-09-04 DIAGNOSIS — N189 Chronic kidney disease, unspecified: Secondary | ICD-10-CM | POA: Diagnosis not present

## 2022-09-04 DIAGNOSIS — N3289 Other specified disorders of bladder: Secondary | ICD-10-CM | POA: Diagnosis not present

## 2022-09-06 DIAGNOSIS — Z79899 Other long term (current) drug therapy: Secondary | ICD-10-CM | POA: Diagnosis not present

## 2022-09-06 DIAGNOSIS — N390 Urinary tract infection, site not specified: Secondary | ICD-10-CM | POA: Diagnosis not present

## 2022-09-06 DIAGNOSIS — E039 Hypothyroidism, unspecified: Secondary | ICD-10-CM | POA: Diagnosis not present

## 2022-09-06 DIAGNOSIS — R531 Weakness: Secondary | ICD-10-CM | POA: Diagnosis not present

## 2022-09-06 DIAGNOSIS — R339 Retention of urine, unspecified: Secondary | ICD-10-CM | POA: Diagnosis not present

## 2022-09-06 DIAGNOSIS — E871 Hypo-osmolality and hyponatremia: Secondary | ICD-10-CM | POA: Diagnosis not present

## 2022-09-06 DIAGNOSIS — G928 Other toxic encephalopathy: Secondary | ICD-10-CM | POA: Diagnosis not present

## 2022-09-06 DIAGNOSIS — R4182 Altered mental status, unspecified: Secondary | ICD-10-CM | POA: Diagnosis not present

## 2022-09-06 DIAGNOSIS — F79 Unspecified intellectual disabilities: Secondary | ICD-10-CM | POA: Diagnosis not present

## 2022-09-06 DIAGNOSIS — R404 Transient alteration of awareness: Secondary | ICD-10-CM | POA: Diagnosis not present

## 2022-09-06 DIAGNOSIS — E860 Dehydration: Secondary | ICD-10-CM | POA: Diagnosis not present

## 2022-09-06 DIAGNOSIS — R319 Hematuria, unspecified: Secondary | ICD-10-CM | POA: Diagnosis present

## 2022-09-06 DIAGNOSIS — R112 Nausea with vomiting, unspecified: Secondary | ICD-10-CM | POA: Diagnosis not present

## 2022-09-06 DIAGNOSIS — R1111 Vomiting without nausea: Secondary | ICD-10-CM | POA: Diagnosis not present

## 2022-09-06 DIAGNOSIS — G4733 Obstructive sleep apnea (adult) (pediatric): Secondary | ICD-10-CM | POA: Diagnosis not present

## 2022-09-06 DIAGNOSIS — N179 Acute kidney failure, unspecified: Secondary | ICD-10-CM | POA: Diagnosis not present

## 2022-09-06 DIAGNOSIS — N183 Chronic kidney disease, stage 3 unspecified: Secondary | ICD-10-CM | POA: Diagnosis not present

## 2022-09-06 DIAGNOSIS — K115 Sialolithiasis: Secondary | ICD-10-CM | POA: Diagnosis present

## 2022-09-13 DIAGNOSIS — R8290 Unspecified abnormal findings in urine: Secondary | ICD-10-CM | POA: Diagnosis not present

## 2022-09-13 DIAGNOSIS — R339 Retention of urine, unspecified: Secondary | ICD-10-CM | POA: Diagnosis not present

## 2022-09-13 DIAGNOSIS — N1330 Unspecified hydronephrosis: Secondary | ICD-10-CM | POA: Diagnosis not present

## 2022-09-13 DIAGNOSIS — N189 Chronic kidney disease, unspecified: Secondary | ICD-10-CM | POA: Diagnosis not present

## 2022-09-18 DIAGNOSIS — F411 Generalized anxiety disorder: Secondary | ICD-10-CM | POA: Diagnosis not present

## 2022-09-18 DIAGNOSIS — Z6835 Body mass index (BMI) 35.0-35.9, adult: Secondary | ICD-10-CM | POA: Diagnosis not present

## 2022-09-18 DIAGNOSIS — Z79899 Other long term (current) drug therapy: Secondary | ICD-10-CM | POA: Diagnosis not present

## 2022-09-18 DIAGNOSIS — F331 Major depressive disorder, recurrent, moderate: Secondary | ICD-10-CM | POA: Diagnosis not present

## 2022-09-19 DIAGNOSIS — N1330 Unspecified hydronephrosis: Secondary | ICD-10-CM | POA: Diagnosis not present

## 2022-09-19 DIAGNOSIS — R339 Retention of urine, unspecified: Secondary | ICD-10-CM | POA: Diagnosis not present

## 2022-09-23 DIAGNOSIS — K581 Irritable bowel syndrome with constipation: Secondary | ICD-10-CM | POA: Diagnosis not present

## 2022-09-23 DIAGNOSIS — K760 Fatty (change of) liver, not elsewhere classified: Secondary | ICD-10-CM | POA: Diagnosis not present

## 2022-09-23 DIAGNOSIS — Z6835 Body mass index (BMI) 35.0-35.9, adult: Secondary | ICD-10-CM | POA: Diagnosis not present

## 2022-09-23 DIAGNOSIS — Z79899 Other long term (current) drug therapy: Secondary | ICD-10-CM | POA: Diagnosis not present

## 2022-09-25 DIAGNOSIS — N3001 Acute cystitis with hematuria: Secondary | ICD-10-CM | POA: Diagnosis not present

## 2022-10-02 ENCOUNTER — Ambulatory Visit (INDEPENDENT_AMBULATORY_CARE_PROVIDER_SITE_OTHER): Payer: Medicare Other | Admitting: Internal Medicine

## 2022-10-02 ENCOUNTER — Encounter: Payer: Self-pay | Admitting: Internal Medicine

## 2022-10-02 VITALS — BP 106/84 | HR 76 | Temp 97.9°F | Wt 178.8 lb

## 2022-10-02 DIAGNOSIS — K219 Gastro-esophageal reflux disease without esophagitis: Secondary | ICD-10-CM | POA: Diagnosis not present

## 2022-10-02 DIAGNOSIS — G4733 Obstructive sleep apnea (adult) (pediatric): Secondary | ICD-10-CM | POA: Diagnosis not present

## 2022-10-02 DIAGNOSIS — H1013 Acute atopic conjunctivitis, bilateral: Secondary | ICD-10-CM | POA: Diagnosis not present

## 2022-10-02 DIAGNOSIS — L0100 Impetigo, unspecified: Secondary | ICD-10-CM | POA: Diagnosis not present

## 2022-10-02 DIAGNOSIS — J310 Chronic rhinitis: Secondary | ICD-10-CM

## 2022-10-02 DIAGNOSIS — L308 Other specified dermatitis: Secondary | ICD-10-CM

## 2022-10-02 DIAGNOSIS — R053 Chronic cough: Secondary | ICD-10-CM | POA: Diagnosis not present

## 2022-10-02 DIAGNOSIS — H1010 Acute atopic conjunctivitis, unspecified eye: Secondary | ICD-10-CM

## 2022-10-02 MED ORDER — IPRATROPIUM BROMIDE 0.06 % NA SOLN: 2.0000 | Freq: Four times a day (QID) | NASAL | 12 refills | Status: DC

## 2022-10-02 MED ORDER — AZELASTINE HCL 0.1 % NA SOLN: 2.0000 | Freq: Two times a day (BID) | NASAL | 5 refills | Status: DC

## 2022-10-02 NOTE — Progress Notes (Signed)
Follow Up Note  RE: Sonya Moran MRN: [id number] DOB: Aug 26, 1975 Date of Office Visit: 10/02/2022  Referring provider: Renaye Rakers, MD Primary care provider: Renaye Rakers, MD  Chief Complaint: Follow-up (Nose, ears and eye are itchy, some runny nose at times, both ears is stuffed.)  History of Present Illness: I had the pleasure of seeing Sonya Moran for a follow up visit at the [facility] on 10/02/2022. She is a 47 y.o. female, who is being followed for mixed rhinitis, chronic cough, sleep apnea, GERD, allergic conjunctivitis, . Her previous allergy office visit was on 07/02/22 with Dr. Marlynn Perking. Today is a regular follow up visit.  History obtained from patient, chart review and  care giver .  At last visit she was treated treated with mupirocin ointment for impetigo and given Atrovent for nasal drainage.  She had a rash on her leg and we started triamcinolone.  Today she and her caregiver feel like she is at her baseline.  She is able to use her CPAP at night.   Cough is well controlled. Using albuterol nebs 1-2 times per month for wheezing.   Complaining of chronic nasal congestion and ear pain.    She currently is on levocetirizine, Flonase, montelukast, Astelin, saline rinses, atrovent. Only using nose sprays once daily  Rash has resolved.     She has been to the ED for UTI. She had an indwelling foley which got removed today which they think has attributed to UTI.   GERD is controlled on dexilant.   Assessment and Plan: Sonya Moran is a 47 y.o. female with: Mixed rhinitis  OSA on CPAP  Other specified dermatitis  Refractory chronic cough  Seasonal allergic conjunctivitis  Impetigo  Gastroesophageal reflux disease, unspecified whether esophagitis present Plan: Patient Instructions  Mixed rhinitis:  Continue -Atrovent (ipatopium) nasal spray 1-2 sprays in each nostril up to three times daily for runny nose Continue levocetirizine 5 mg once a day as  needed for runny nose.  Continue Flonase 1 sprays in each nostril twice day - do this every day  Continue montelukast 10 mg once a day Continue azelastine nasal spray 2 sprays twice a day - do this every day  Continue saline nasal rinses as needed for nasal symptoms. Use this before any medicated nasal sprays for best result Continue mupirocin ointment as needed for nasal sores    Rash on legs : resolved  Continue triamcinolone ointment twice a day as needed    Cough: controlled  Continue albuterol nebs once every 4 hours as needed for cough or wheeze   Sleep apnea Continue to use CPAP machine   Allergic conjunctivitis Continue Pataday (olopatadine 0.2%) eye drops one drop in each eye once a day as needed for red, itchy eyes  Reflux Continue dietary and lifestyle modifications as listed below Continue dexilant  Continue use IBS medications as precribed by GI   Follow up: 3 months   Thank you so much for letting me partake in your care today.  Don't hesitate to reach out if you have any additional concerns!  Ferol Luz, MD  [facility] [place] [place] No follow-ups on file.  No orders of the defined types were placed in this encounter.   Lab Orders  No laboratory test(s) ordered today   Diagnostics: None done    Medication List:  Current Outpatient Medications  Medication Sig Dispense Refill   azelastine (ASTELIN) 0.1 % nasal spray Place 2 sprays into  both nostrils 2 (two) times daily. 30 mL 5   cefUROXime (CEFTIN) 250 MG tablet Take by mouth.     dexlansoprazole (DEXILANT) 60 MG capsule Take 1 capsule (60 mg total) by mouth daily. 30 capsule 11   fluticasone (FLONASE) 50 MCG/ACT nasal spray Place 2 sprays into both nostrils daily. 16 g 5   ipratropium (ATROVENT) 0.06 % nasal spray Place 2 sprays into both nostrils 4 (four) times daily. 15 mL 12   lamoTRIgine (LAMICTAL) 200 MG tablet Take 200 mg by mouth daily.      levocetirizine (XYZAL) 5 MG  tablet Take 1 tablet (5 mg total) by mouth at bedtime. 30 tablet 5   levothyroxine (SYNTHROID) 25 MCG tablet TAKE (1) TABLET BY MOUTH ONCE DAILY ON AN EMPTY STOMACH. DO NOT EAT FOR 30 MINS AFTER TAKING MED. 30 tablet 0   LORazepam (ATIVAN) 1 MG tablet Take by mouth.     lubiprostone (AMITIZA) 8 MCG capsule Take by mouth.     montelukast (SINGULAIR) 10 MG tablet TAKE ONE TABLET BY MOUTH EACH EVENING TO PREVENT COUGH OR WHEEZE 90 tablet 1   nitrofurantoin, macrocrystal-monohydrate, (MACROBID) 100 MG capsule Take 100 mg by mouth 2 (two) times daily.     nystatin cream (MYCOSTATIN) Apply topically.     nystatin-triamcinolone ointment (MYCOLOG) Apply 1 Application topically 2 (two) times daily. 30 g 3   OLANZapine (ZYPREXA) 15 MG tablet Take 1 tablet (15 mg total) by mouth daily after breakfast. 30 tablet 0   Olopatadine HCl 0.2 % SOLN Place 1 drop into both eyes daily as needed (for itching). 2.5 mL 5   propranolol (INDERAL) 40 MG tablet Take 60 mg by mouth 2 (two) times daily. 60mg   in the morning     tamsulosin (FLOMAX) 0.4 MG CAPS capsule Take 0.4 mg by mouth daily.     TRULANCE 3 MG TABS Take 1 tablet by mouth daily.     VASCEPA 1 g capsule Take 2 g by mouth 2 (two) times daily.     Vitamin D, Ergocalciferol, (DRISDOL) 1.25 MG (50000 UNIT) CAPS capsule Take 50,000 Units by mouth once a week.     albuterol (VENTOLIN HFA) 108 (90 Base) MCG/ACT inhaler Inhale 2 puffs into the lungs every 6 (six) hours as needed for wheezing or shortness of breath. (Patient not taking: Reported on 04/01/2022) 6.7 g 0   amitriptyline (ELAVIL) 100 MG tablet Take 100 mg by mouth at bedtime.  (Patient not taking: Reported on 10/02/2022)     LINZESS 145 MCG CAPS capsule Take 290 mcg by mouth daily. (Patient not taking: Reported on 10/02/2022)     mirabegron ER (MYRBETRIQ) 25 MG TB24 tablet Take 25 mg by mouth daily. (Patient not taking: Reported on 10/02/2022)     mupirocin ointment (BACTROBAN) 2 % Apply 1 Application  topically 2 (two) times daily. (Patient not taking: Reported on 10/02/2022) 22 g 0   ondansetron (ZOFRAN ODT) 4 MG disintegrating tablet Take 1 tablet (4 mg total) by mouth every 8 (eight) hours as needed for nausea or vomiting. (Patient not taking: Reported on 12/31/2021) 20 tablet 0   pantoprazole (PROTONIX) 40 MG tablet Take 40 mg by mouth daily. (Patient not taking: Reported on 10/02/2022)     sodium chloride (SALINE MIST) 0.65 % nasal spray Place 2 sprays into the nose in the morning and at bedtime. (Patient not taking: Reported on 10/02/2022) 15 mL 3   No current facility-administered medications for this visit.   Allergies: Allergies  Allergen Reactions   Griseofulvin Anaphylaxis, Other (See Comments), Shortness Of Breath and Swelling    Arm swelling Other reaction(s): Other (See Comments) Arm swelling   I reviewed her past medical history, social history, family history, and environmental history and no significant changes have been reported from her previous visit.  ROS: All others negative except as noted per HPI.   Objective: BP 106/84   Pulse 76   Temp 97.9 F (36.6 C) (Temporal)   Wt 178 lb 12.8 oz (81.1 kg)   SpO2 97%   BMI 36.11 kg/m  Body mass index is 36.11 kg/m. General Appearance:  Alert, cooperative, no distress, appears stated age  Head:  Normocephalic, without obvious abnormality, atraumatic  Eyes:  Conjunctiva clear, EOM's intact Ears with cerumen bilaterally, TM- normal   Nose: Nares normal,  PINK nasal mucosa, edematous nasal mucosa , hypertrophic turbinates, no visible anterior polyps, and septum midline,   Throat: Lips, tongue normal; teeth and gums normal, normal posterior oropharynx  Neck: Supple, symmetrical  Lungs:   clear to auscultation bilaterally, Respirations unlabored, no coughing  Heart:  regular rate and rhythm and no murmur, Appears well perfused  Extremities: No edema   Skin: Skin color, texture, turgor normal, No rashes on exposed areas  of skin   Neurologic: No gross deficits   Previous notes and tests were reviewed. The plan was reviewed with the patient/family, and all questions/concerned were addressed.  It was my pleasure to see Dellena today and participate in her care. Please feel free to contact me with any questions or concerns.  Sincerely,  Ferol Luz, MD  Allergy & Immunology  [facility] Office: [phone]

## 2022-10-02 NOTE — Patient Instructions (Addendum)
Mixed rhinitis:  Continue -Atrovent (ipatopium) nasal spray 1-2 sprays in each nostril up to three times daily for runny nose Continue levocetirizine 5 mg once a day as needed for runny nose.  Continue Flonase 1 sprays in each nostril twice day - do this every day  Continue montelukast 10 mg once a day Continue azelastine nasal spray 2 sprays twice a day - do this every day  Continue saline nasal rinses as needed for nasal symptoms. Use this before any medicated nasal sprays for best result Continue mupirocin ointment as needed for nasal sores    Rash on legs : resolved  Continue triamcinolone ointment twice a day as needed    Cough: controlled  Continue albuterol nebs once every 4 hours as needed for cough or wheeze   Sleep apnea Continue to use CPAP machine   Allergic conjunctivitis Continue Pataday (olopatadine 0.2%) eye drops one drop in each eye once a day as needed for red, itchy eyes  Reflux Continue dietary and lifestyle modifications as listed below Continue dexilant  Continue use IBS medications as precribed by GI   Follow up: 3 months   Thank you so much for letting me partake in your care today.  Don't hesitate to reach out if you have any additional concerns!  Ferol Luz, MD  [facility] [place] [place]

## 2022-10-03 DIAGNOSIS — N1330 Unspecified hydronephrosis: Secondary | ICD-10-CM | POA: Diagnosis not present

## 2022-10-03 DIAGNOSIS — R339 Retention of urine, unspecified: Secondary | ICD-10-CM | POA: Diagnosis not present

## 2022-10-10 ENCOUNTER — Encounter (HOSPITAL_BASED_OUTPATIENT_CLINIC_OR_DEPARTMENT_OTHER): Payer: Self-pay

## 2022-10-10 ENCOUNTER — Emergency Department (HOSPITAL_BASED_OUTPATIENT_CLINIC_OR_DEPARTMENT_OTHER)
Admission: EM | Admit: 2022-10-10 | Discharge: 2022-10-10 | Disposition: A | Discharge status: Home / Self Care | Payer: Medicare Other | Attending: Emergency Medicine | Admitting: Emergency Medicine

## 2022-10-10 DIAGNOSIS — Z79890 Hormone replacement therapy (postmenopausal): Secondary | ICD-10-CM | POA: Insufficient documentation

## 2022-10-10 DIAGNOSIS — E039 Hypothyroidism, unspecified: Secondary | ICD-10-CM | POA: Diagnosis not present

## 2022-10-10 DIAGNOSIS — T83511A Infection and inflammatory reaction due to indwelling urethral catheter, initial encounter: Secondary | ICD-10-CM | POA: Insufficient documentation

## 2022-10-10 DIAGNOSIS — Y732 Prosthetic and other implants, materials and accessory gastroenterology and urology devices associated with adverse incidents: Secondary | ICD-10-CM | POA: Diagnosis not present

## 2022-10-10 DIAGNOSIS — Z978 Presence of other specified devices: Secondary | ICD-10-CM

## 2022-10-10 DIAGNOSIS — T83091A Other mechanical complication of indwelling urethral catheter, initial encounter: Secondary | ICD-10-CM | POA: Diagnosis not present

## 2022-10-10 DIAGNOSIS — R39198 Other difficulties with micturition: Secondary | ICD-10-CM | POA: Diagnosis present

## 2022-10-10 LAB — URINALYSIS, MICROSCOPIC (REFLEX)
Bacteria, UA: NONE SEEN
RBC / HPF: NONE SEEN RBC/hpf (ref 0–5)
WBC, UA: NONE SEEN WBC/hpf (ref 0–5)

## 2022-10-10 LAB — URINALYSIS, ROUTINE W REFLEX MICROSCOPIC
Bilirubin Urine: NEGATIVE
Glucose, UA: NEGATIVE mg/dL
Ketones, ur: NEGATIVE mg/dL
Leukocytes,Ua: NEGATIVE
Nitrite: NEGATIVE
Protein, ur: 100 mg/dL — AB
Specific Gravity, Urine: <=1.005 (ref 1.005–1.030)
pH: 6 (ref 5.0–8.0)

## 2022-10-10 MED ORDER — ACETAMINOPHEN 325 MG PO TABS
650.0000 mg | ORAL_TABLET | Freq: Once | ORAL | Status: AC
  Administered 2022-10-10: 650 mg via ORAL
  Filled 2022-10-10: qty 2

## 2022-10-10 NOTE — ED Triage Notes (Signed)
Pt seen by Urologist.  Assistant states that pt has been retaining fluid and they foley. Has had multiple issues with cath and most recent placement was 09/19. Pt reports burning last night to sitter.

## 2022-10-10 NOTE — ED Notes (Signed)
Fall risk armband Fall risk sign on door Patient wearing sneekers

## 2022-10-10 NOTE — Discharge Instructions (Signed)
It was a pleasure caring for you today in the emergency department.  Please return to the emergency department for any worsening or worrisome symptoms.  Please follow-up with urology at your next appointment.
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
## 2022-10-14 DIAGNOSIS — D649 Anemia, unspecified: Secondary | ICD-10-CM | POA: Diagnosis not present

## 2022-10-29 DIAGNOSIS — H10413 Chronic giant papillary conjunctivitis, bilateral: Secondary | ICD-10-CM | POA: Diagnosis not present

## 2022-10-31 DIAGNOSIS — R339 Retention of urine, unspecified: Secondary | ICD-10-CM | POA: Diagnosis not present

## 2022-11-04 DIAGNOSIS — Z23 Encounter for immunization: Secondary | ICD-10-CM | POA: Diagnosis not present

## 2022-11-04 DIAGNOSIS — Z79899 Other long term (current) drug therapy: Secondary | ICD-10-CM | POA: Diagnosis not present

## 2022-11-04 DIAGNOSIS — D508 Other iron deficiency anemias: Secondary | ICD-10-CM | POA: Diagnosis not present

## 2022-11-04 DIAGNOSIS — E6609 Other obesity due to excess calories: Secondary | ICD-10-CM | POA: Diagnosis not present

## 2022-11-04 DIAGNOSIS — Z6835 Body mass index (BMI) 35.0-35.9, adult: Secondary | ICD-10-CM | POA: Diagnosis not present

## 2022-11-06 DIAGNOSIS — R339 Retention of urine, unspecified: Secondary | ICD-10-CM | POA: Diagnosis not present

## 2022-11-21 DIAGNOSIS — K5909 Other constipation: Secondary | ICD-10-CM | POA: Diagnosis not present

## 2022-12-06 DIAGNOSIS — R351 Nocturia: Secondary | ICD-10-CM | POA: Diagnosis not present

## 2022-12-06 DIAGNOSIS — R339 Retention of urine, unspecified: Secondary | ICD-10-CM | POA: Diagnosis not present

## 2022-12-18 DIAGNOSIS — J0100 Acute maxillary sinusitis, unspecified: Secondary | ICD-10-CM | POA: Diagnosis not present

## 2022-12-18 DIAGNOSIS — E6609 Other obesity due to excess calories: Secondary | ICD-10-CM | POA: Diagnosis not present

## 2022-12-18 DIAGNOSIS — F411 Generalized anxiety disorder: Secondary | ICD-10-CM | POA: Diagnosis not present

## 2022-12-18 DIAGNOSIS — Z6836 Body mass index (BMI) 36.0-36.9, adult: Secondary | ICD-10-CM | POA: Diagnosis not present

## 2022-12-18 DIAGNOSIS — F331 Major depressive disorder, recurrent, moderate: Secondary | ICD-10-CM | POA: Diagnosis not present

## 2022-12-18 DIAGNOSIS — Z20822 Contact with and (suspected) exposure to covid-19: Secondary | ICD-10-CM | POA: Diagnosis not present

## 2022-12-18 DIAGNOSIS — Z79899 Other long term (current) drug therapy: Secondary | ICD-10-CM | POA: Diagnosis not present

## 2022-12-26 ENCOUNTER — Other Ambulatory Visit: Payer: Self-pay | Admitting: Internal Medicine

## 2023-01-01 ENCOUNTER — Ambulatory Visit (INDEPENDENT_AMBULATORY_CARE_PROVIDER_SITE_OTHER): Payer: Medicare Other | Admitting: Internal Medicine

## 2023-01-01 VITALS — BP 108/80 | HR 78 | Temp 97.0°F | Resp 18

## 2023-01-01 DIAGNOSIS — K219 Gastro-esophageal reflux disease without esophagitis: Secondary | ICD-10-CM | POA: Diagnosis not present

## 2023-01-01 DIAGNOSIS — H6122 Impacted cerumen, left ear: Secondary | ICD-10-CM | POA: Diagnosis not present

## 2023-01-01 DIAGNOSIS — L308 Other specified dermatitis: Secondary | ICD-10-CM

## 2023-01-01 DIAGNOSIS — J310 Chronic rhinitis: Secondary | ICD-10-CM

## 2023-01-01 DIAGNOSIS — R058 Other specified cough: Secondary | ICD-10-CM

## 2023-01-01 DIAGNOSIS — J3489 Other specified disorders of nose and nasal sinuses: Secondary | ICD-10-CM

## 2023-01-01 DIAGNOSIS — G4733 Obstructive sleep apnea (adult) (pediatric): Secondary | ICD-10-CM

## 2023-01-01 MED ORDER — CLOBETASOL PROPIONATE 0.05 % EX OINT: TOPICAL_OINTMENT | CUTANEOUS | 1 refills | Status: AC

## 2023-01-01 MED ORDER — METHYLPREDNISOLONE ACETATE 40 MG/ML IJ SUSP
40.0000 mg | Freq: Once | INTRAMUSCULAR | Status: AC
Start: 2023-01-01 — End: 2023-01-01
  Administered 2023-01-01: 40 mg via INTRAMUSCULAR

## 2023-01-01 MED ORDER — TRIAMCINOLONE ACETONIDE 0.1 % EX OINT: TOPICAL_OINTMENT | CUTANEOUS | 1 refills | Status: AC

## 2023-01-01 NOTE — Patient Instructions (Addendum)
Mixed rhinitis: complete nasal obstruction bilaterally  Depo Medrol 40mg  IM given in clinic  Continue -Atrovent (ipatopium) nasal spray 1-2 sprays in each nostril up to three times daily for runny nose Continue levocetirizine 5 mg once a day as needed for runny nose.  Continue Flonase 1 sprays in each nostril twice day - do this every day  Continue montelukast 10 mg once a day Continue azelastine nasal spray 2 sprays twice a day - do this every day  Continue saline nasal rinses as needed for nasal symptoms. Use this before any medicated nasal sprays for best result Continue mupirocin ointment as needed for nasal sores   Dermatitis  Continue triamcinolone ointment twice a day as needed  If not working can step up to clobetasol ointment twice daily   - do not use for more than 2 weeks  - do not use on face, armpits or groin    Ear Wax and Ear pain  - we will refer you to ENT   Cough: controlled  Continue albuterol nebs once every 4 hours as needed for cough or wheeze   Sleep apnea Continue to use CPAP machine   Allergic conjunctivitis Continue Pataday (olopatadine 0.2%) eye drops one drop in each eye once a day as needed for red, itchy eyes  Reflux Continue dietary and lifestyle modifications as listed below Continue dexilant  Continue use IBS medications as precribed by GI   Follow up: 3 months   Thank you so much for letting me partake in your care today.  Don't hesitate to reach out if you have any additional concerns!  Ferol Luz, MD  [facility] [place] [place]

## 2023-01-01 NOTE — Progress Notes (Signed)
Follow Up Note  RE: MALEIGHA Moran MRN: [id number] DOB: Mar 06, 1975 Date of Office Visit: 01/01/2023  Referring provider: Renaye Rakers, MD Primary care provider: Renaye Rakers, MD  Chief Complaint: Sinusitis (Completed abx course 2 weeks ago. Still having bilateral ear pain. ) and Eczema (Patches on arm and back. Triamcinolone not working. )  History of Present Illness: I had the pleasure of seeing Sonya Moran for a follow up visit at the [facility] on 01/01/2023. She is a 47 y.o. female, who is being followed for mixed rhinitis, chronic cough, sleep apnea, GERD, allergic conjunctivitis, . Her previous allergy office visit was on 10/10/22 with Dr. Marlynn Perking. Today is a regular follow up visit.  History obtained from patient, chart review and  care giver .    The patient, known to have chronic sinusitis, presented with persistent nasal congestion and discomfort despite a recent course of amoxicillin for a sinus and ear infection. She reported no improvement in symptoms, with ongoing nasal blockage and associated thirst due to mouth breathing. She also reported the presence of sores inside the nose.  In addition to the sinusitis, the patient has been managing a rash on her legs and arms. She reported the presence of new spots on her arms, but no current rash on her legs. She has been using triamcinolone ointment for the rash, but it appears to be ineffective for the new spots.  The patient also has a history of respiratory issues, requiring regular use of albuterol nebs.  She reported a persistent cough and stuffiness, along with ear discomfort. Upon examination, earwax build-up was noted, but no active infection was identified.  The patient's chronic nasal congestion and associated symptoms have been periodically managed with steroid injections, approximately every six months. The patient is agreeable to this treatment.  She currently is on levocetirizine, Flonase, montelukast,  Astelin, saline rinses, atrovent. Only using nose sprays once daily    GERD is controlled on dexilant.   Assessment and Plan: Rumaisa is a 47 y.o. female with: Other specified dermatitis  OSA on CPAP  Mixed rhinitis  Other cough  Impacted cerumen of left ear  Gastroesophageal reflux disease, unspecified whether esophagitis present  Nasal obstruction Plan: Patient Instructions  Mixed rhinitis: complete nasal obstruction bilaterally  Depo Medrol 40mg  IM given in clinic  Continue -Atrovent (ipatopium) nasal spray 1-2 sprays in each nostril up to three times daily for runny nose Continue levocetirizine 5 mg once a day as needed for runny nose.  Continue Flonase 1 sprays in each nostril twice day - do this every day  Continue montelukast 10 mg once a day Continue azelastine nasal spray 2 sprays twice a day - do this every day  Continue saline nasal rinses as needed for nasal symptoms. Use this before any medicated nasal sprays for best result Continue mupirocin ointment as needed for nasal sores   Dermatitis  Continue triamcinolone ointment twice a day as needed  If not working can step up to clobetasol ointment twice daily   - do not use for more than 2 weeks  - do not use on face, armpits or groin    Ear Wax and Ear pain  - we will refer you to ENT   Cough: controlled  Continue albuterol nebs once every 4 hours as needed for cough or wheeze   Sleep apnea Continue to use CPAP machine   Allergic conjunctivitis Continue Pataday (olopatadine 0.2%) eye drops one drop in each eye once  a day as needed for red, itchy eyes  Reflux Continue dietary and lifestyle modifications as listed below Continue dexilant  Continue use IBS medications as precribed by GI   Follow up: 3 months   Thank you so much for letting me partake in your care today.  Don't hesitate to reach out if you have any additional concerns!  Ferol Luz, MD  [facility] [place] [place] No follow-ups on file.  Meds ordered this encounter  Medications   clobetasol ointment (TEMOVATE) 0.05 %    Sig: Apply topically twice daily to BODY as needed for SEVERE red, sandpaper and thickened like rash.  Do not use on face, groin or armpits.  Use for up to two weeks at a time.    Dispense:  60 g    Refill:  1   triamcinolone ointment (KENALOG) 0.1 %    Sig: Apply topically twice daily to BODY as needed for red, sandpaper like rash.  Do not use on face, groin or armpits.    Dispense:  80 g    Refill:  1    Lab Orders  No laboratory test(s) ordered today   Diagnostics: None done    Medication List:  Current Outpatient Medications  Medication Sig Dispense Refill   albuterol (VENTOLIN HFA) 108 (90 Base) MCG/ACT inhaler Inhale 2 puffs into the lungs every 6 (six) hours as needed for wheezing or shortness of breath. 6.7 g 0   azelastine (ASTELIN) 0.1 % nasal spray Place 2 sprays into both nostrils 2 (two) times daily. 30 mL 5   clobetasol ointment (TEMOVATE) 0.05 % Apply topically twice daily to BODY as needed for SEVERE red, sandpaper and thickened like rash.  Do not use on face, groin or armpits.  Use for up to two weeks at a time. 60 g 1   dexlansoprazole (DEXILANT) 60 MG capsule Take 1 capsule (60 mg total) by mouth daily. 30 capsule 11   ferrous sulfate 325 (65 FE) MG tablet Take by mouth.     fluticasone (FLONASE) 50 MCG/ACT nasal spray Place 2 sprays into both nostrils daily. 16 g 5   ipratropium (ATROVENT) 0.06 % nasal spray Place 2 sprays into both nostrils 4 (four) times daily. 15 mL 12   lamoTRIgine (LAMICTAL) 200 MG tablet Take 200 mg by mouth daily.      levocetirizine (XYZAL) 5 MG tablet TAKE ONE TABLET BY MOUTH EVERY NIGHT AT BEDTIME 30 tablet 5   levothyroxine (SYNTHROID) 25 MCG tablet TAKE (1) TABLET BY MOUTH ONCE DAILY ON AN EMPTY STOMACH. DO NOT EAT FOR 30 MINS AFTER TAKING MED. 30 tablet 0   LORazepam (ATIVAN) 1 MG tablet Take by mouth.     lubiprostone  (AMITIZA) 8 MCG capsule Take by mouth.     magnesium oxide (MAG-OX) 400 MG tablet Take by mouth.     mirabegron ER (MYRBETRIQ) 25 MG TB24 tablet Take 25 mg by mouth daily.     montelukast (SINGULAIR) 10 MG tablet TAKE ONE TABLET BY MOUTH EACH EVENING TO PREVENT COUGH OR WHEEZE 90 tablet 1   nystatin cream (MYCOSTATIN) Apply topically.     OLANZapine (ZYPREXA) 15 MG tablet Take 1 tablet (15 mg total) by mouth daily after breakfast. 30 tablet 0   Olopatadine HCl 0.2 % SOLN Place 1 drop into both eyes daily as needed (for itching). 2.5 mL 5   propranolol (INDERAL) 60 MG tablet Take 60 mg by mouth 2 (two) times daily.  tamsulosin (FLOMAX) 0.4 MG CAPS capsule Take 0.4 mg by mouth daily.     triamcinolone ointment (KENALOG) 0.1 % Apply topically twice daily to BODY as needed for red, sandpaper like rash.  Do not use on face, groin or armpits. 80 g 1   VASCEPA 1 g capsule Take 2 g by mouth 2 (two) times daily.     Vitamin D, Ergocalciferol, (DRISDOL) 1.25 MG (50000 UNIT) CAPS capsule Take 50,000 Units by mouth once a week.     No current facility-administered medications for this visit.   Allergies: Allergies  Allergen Reactions   Griseofulvin Anaphylaxis, Other (See Comments), Shortness Of Breath and Swelling    Arm swelling Other reaction(s): Other (See Comments) Arm swelling   I reviewed her past medical history, social history, family history, and environmental history and no significant changes have been reported from her previous visit.  ROS: All others negative except as noted per HPI.   Objective: BP 108/80   Pulse 78   Temp (!) 97 F (36.1 C) (Temporal)   Resp 18   SpO2 94%  There is no height or weight on file to calculate BMI. General Appearance:  Alert, cooperative, no distress, appears stated age  Head:  Normocephalic, without obvious abnormality, atraumatic  Eyes:  Conjunctiva clear, EOM's intact Ears with cerumen bilaterally, TM- normal   Nose: Nares normal,  PINK  nasal mucosa, VERY edematous nasal mucosa with 90% obstruction bilateral  , hypertrophic turbinates, no visible anterior polyps, and septum midline,   Throat: Lips, tongue normal; teeth and gums normal, normal posterior oropharynx  Neck: Supple, symmetrical  Lungs:   clear to auscultation bilaterally, Respirations unlabored, no coughing  Heart:  regular rate and rhythm and no murmur, Appears well perfused  Extremities: No edema   Skin: Skin color, texture, turgor normal, annular eczematous patches on bilateral upper arms  Neurologic: No gross deficits   Previous notes and tests were reviewed. The plan was reviewed with the patient/family, and all questions/concerned were addressed.  It was my pleasure to see Jodeen today and participate in her care. Please feel free to contact me with any questions or concerns.  Sincerely,  Ferol Luz, MD  Allergy & Immunology  [facility] Office: [phone]

## 2023-01-17 ENCOUNTER — Telehealth: Payer: Self-pay

## 2023-01-17 NOTE — Telephone Encounter (Signed)
 Sonya Moran has been referred internally to [facility] on [address].  In order to place the referral I had to specify a provider and the only one was Dr. Karis.  I know how specific he is, so I wrote sever notes throughout the referral to please schedule with any provider at that office.  Since the referral is labeled internal, their office will reach out to [facility] and schedule her appointment.  I called and left a message informing Tori that the referral has been placed.

## 2023-01-17 NOTE — Telephone Encounter (Signed)
 Please place a referral to the following office via Epic: [facility] [address] [place],  [place]  [address] Main: [phone] Fax: [phone]  Thanks

## 2023-01-17 NOTE — Telephone Encounter (Signed)
-----   Message from Ferol Luz sent at 01/01/2023  3:51 PM EST ----- Can we refer this patient to ent for ear pain and excessive ear wax?  Thanks!

## 2023-02-12 ENCOUNTER — Telehealth (INDEPENDENT_AMBULATORY_CARE_PROVIDER_SITE_OTHER): Payer: Self-pay | Admitting: Otolaryngology

## 2023-02-12 NOTE — Telephone Encounter (Signed)
Reminder Call: Date: 02/13/2023 Status: Sch  Time: 1:00 PM [address] [place], [place] [address]  Confirmed time and location w/patient.

## 2023-02-13 ENCOUNTER — Encounter (INDEPENDENT_AMBULATORY_CARE_PROVIDER_SITE_OTHER): Payer: Self-pay

## 2023-02-13 ENCOUNTER — Ambulatory Visit (INDEPENDENT_AMBULATORY_CARE_PROVIDER_SITE_OTHER): Payer: Medicare Other | Admitting: Otolaryngology

## 2023-02-13 VITALS — BP 115/78 | HR 73 | Resp 18 | Ht <= 58 in | Wt 174.0 lb

## 2023-02-13 DIAGNOSIS — J343 Hypertrophy of nasal turbinates: Secondary | ICD-10-CM

## 2023-02-13 DIAGNOSIS — H6123 Impacted cerumen, bilateral: Secondary | ICD-10-CM

## 2023-02-13 DIAGNOSIS — H6993 Unspecified Eustachian tube disorder, bilateral: Secondary | ICD-10-CM

## 2023-02-13 DIAGNOSIS — H608X3 Other otitis externa, bilateral: Secondary | ICD-10-CM

## 2023-02-13 DIAGNOSIS — R0981 Nasal congestion: Secondary | ICD-10-CM

## 2023-02-13 MED ORDER — OFLOXACIN 0.3 % OT SOLN: 5.0000 [drp] | Freq: Every day | OTIC | 0 refills | Status: AC
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
## 2023-04-02 ENCOUNTER — Ambulatory Visit: Payer: Medicare Other | Admitting: Internal Medicine

## 2023-04-02 ENCOUNTER — Encounter: Payer: Self-pay | Admitting: Internal Medicine

## 2023-04-02 ENCOUNTER — Ambulatory Visit (INDEPENDENT_AMBULATORY_CARE_PROVIDER_SITE_OTHER): Admitting: Internal Medicine

## 2023-04-02 VITALS — BP 110/70 | HR 75 | Temp 97.7°F | Resp 16

## 2023-04-02 DIAGNOSIS — R058 Other specified cough: Secondary | ICD-10-CM

## 2023-04-02 DIAGNOSIS — H608X3 Other otitis externa, bilateral: Secondary | ICD-10-CM | POA: Diagnosis not present

## 2023-04-02 DIAGNOSIS — L308 Other specified dermatitis: Secondary | ICD-10-CM | POA: Diagnosis not present

## 2023-04-02 DIAGNOSIS — B379 Candidiasis, unspecified: Secondary | ICD-10-CM | POA: Diagnosis not present

## 2023-04-02 DIAGNOSIS — G4733 Obstructive sleep apnea (adult) (pediatric): Secondary | ICD-10-CM

## 2023-04-02 DIAGNOSIS — J310 Chronic rhinitis: Secondary | ICD-10-CM

## 2023-04-02 DIAGNOSIS — K219 Gastro-esophageal reflux disease without esophagitis: Secondary | ICD-10-CM

## 2023-04-02 MED ORDER — FLUOCINOLONE ACETONIDE BODY 0.01 % EX OIL: TOPICAL_OIL | CUTANEOUS | 5 refills | Status: DC

## 2023-04-02 MED ORDER — CLOTRIMAZOLE-BETAMETHASONE 1-0.05 % EX CREA: 1.0000 | TOPICAL_CREAM | Freq: Two times a day (BID) | CUTANEOUS | 5 refills | Status: AC

## 2023-04-02 NOTE — Progress Notes (Signed)
 Follow Up Note  RE: Sonya Moran MRN: [id number] DOB: 23-Dec-1975 Date of Office Visit: 04/02/2023  Referring provider: Renaye Rakers, MD Primary care provider: [facility]  Chief Complaint: Allergies (Nasal drip, ears hurt)  History of Present Illness: I had the pleasure of seeing Sonya Moran for a follow up visit at the [facility] on 04/02/2023. She is a 48 y.o. female, who is being followed for mixed rhinitis, chronic cough, sleep apnea, GERD, allergic conjunctivitis, . Her previous allergy office visit was on 01/01/23 with Dr. Marlynn Perking. Today is a regular follow up visit.  History obtained from patient, chart review and  care giver .    Since last visit she was referred to ENT.  She was diagnosed with eustachian tube dysfunction bilateral impacted cerumen which was removed, and chronic eczematous otitis externa, which was treated with moxifloxacin and steroid cream to bilateral ears.  She experiences discomfort in her ears, described as both painful and itchy. She has a history of eczema affecting her ears, which has flared up again. Previously, she saw an ENT specialist who cleaned her ears and prescribed ear drops, which she has now run out of.  She has a flare-up of eczema on her neck and nose. She has been using a cream prescribed by a dermatologist, which initially cleared the condition but has since flared up again. She applies the cream daily.  She experiences irritation in the groin area, described as red and bothersome. [name] suggests that she may not dry the area thoroughly, which could contribute to the irritation. Current using some drying powder.    She has a history of nasal issues, with persistent swelling inside her nose. She uses nasal sprays regularly, but there is concern about potential overuse. No epistaxis, but the inside of her nose remains swollen.  She currently is on levocetirizine, Flonase, montelukast, Astelin, saline rinses,  atrovent. Only using nose sprays once daily  GERD is controlled on dexilant.   She has sleep apnea and is on CPAP.    Assessment and Plan: [name] is a 48 y.o. female with: Other specified dermatitis  Candidiasis  Chronic eczematous otitis externa of both ears  Mixed rhinitis  OSA (obstructive sleep apnea)  Gastroesophageal reflux disease without esophagitis  Other cough Plan: Patient Instructions  Mixed rhinitis: at baseline  Continue -Atrovent (ipatopium) nasal spray 1-2 sprays in each nostril up to three times daily for runny nose Continue levocetirizine 5 mg once a day as needed for runny nose.  Continue Flonase 1 sprays in each nostril twice day - do this every day  Continue montelukast 10 mg once a day Continue azelastine nasal spray 2 sprays twice a day - do this every day  Continue saline nasal rinses as needed for nasal symptoms. Use this before any medicated nasal sprays for best result Continue mupirocin ointment as needed for nasal sores   Dermatitis: flaring on neck  Start Dermasmoothe oil: apply to affected area twice daily  Continue triamcinolone ointment twice a day as needed  If not working can step up to clobetasol ointment twice daily   - do not use for more than 2 weeks  - do not use on face, armpits or groin   Eczematous Otitis Media  Use dermasmoothe oil on q tip for rash in ears    Candidiasis: yeast infection in creases of legs  - Start Clotrimazole-betamethasone cream twice daily as needed   Cough: controlled  Continue albuterol nebs  once every 4 hours as needed for cough or wheeze   Sleep apnea Continue to use CPAP machine   Allergic conjunctivitis Continue Pataday (olopatadine 0.2%) eye drops one drop in each eye once a day as needed for red, itchy eyes  Reflux Continue dietary and lifestyle modifications as listed below Continue dexilant  Continue use IBS medications as precribed by GI   Follow up: 3 months   Thank you so much for  letting me partake in your care today.  Don't hesitate to reach out if you have any additional concerns!  Ferol Luz, MD  [facility] [place] [place] No follow-ups on file.  Meds ordered this encounter  Medications   clotrimazole-betamethasone (LOTRISONE) cream    Sig: Apply 1 Application topically 2 (two) times daily.    Dispense:  30 g    Refill:  5   Fluocinolone Acetonide Body (DERMA-SMOOTHE/FS BODY) 0.01 % OIL    Sig: Apply pea sized amount twice daily as needed    Dispense:  118 mL    Refill:  5    Lab Orders  No laboratory test(s) ordered today   Diagnostics: None done    Medication List:  Current Outpatient Medications  Medication Sig Dispense Refill   azelastine (ASTELIN) 0.1 % nasal spray Place 2 sprays into both nostrils 2 (two) times daily. 30 mL 5   chlorhexidine (PERIDEX) 0.12 % solution Use as directed 5 mLs in the mouth or throat 2 (two) times daily.     clobetasol ointment (TEMOVATE) 0.05 % Apply topically twice daily to BODY as needed for SEVERE red, sandpaper and thickened like rash.  Do not use on face, groin or armpits.  Use for up to two weeks at a time. 60 g 1   clotrimazole-betamethasone (LOTRISONE) cream Apply 1 Application topically 2 (two) times daily. 30 g 5   dexlansoprazole (DEXILANT) 60 MG capsule Take 1 capsule (60 mg total) by mouth daily. 30 capsule 11   ferrous sulfate 325 (65 FE) MG tablet Take by mouth.     Fluocinolone Acetonide Body (DERMA-SMOOTHE/FS BODY) 0.01 % OIL Apply pea sized amount twice daily as needed 118 mL 5   fluticasone (FLONASE) 50 MCG/ACT nasal spray Place 2 sprays into both nostrils daily. 16 g 5   ipratropium (ATROVENT) 0.06 % nasal spray Place 2 sprays into both nostrils 4 (four) times daily. 15 mL 12   lamoTRIgine (LAMICTAL) 200 MG tablet Take 200 mg by mouth daily.      levocetirizine (XYZAL) 5 MG tablet TAKE ONE TABLET BY MOUTH EVERY NIGHT AT BEDTIME 30 tablet 5   levothyroxine (SYNTHROID) 25 MCG  tablet TAKE (1) TABLET BY MOUTH ONCE DAILY ON AN EMPTY STOMACH. DO NOT EAT FOR 30 MINS AFTER TAKING MED. 30 tablet 0   LORazepam (ATIVAN) 1 MG tablet Take by mouth.     lubiprostone (AMITIZA) 8 MCG capsule Take by mouth.     magnesium oxide (MAG-OX) 400 MG tablet Take by mouth.     mirabegron ER (MYRBETRIQ) 25 MG TB24 tablet Take 25 mg by mouth daily.     montelukast (SINGULAIR) 10 MG tablet TAKE ONE TABLET BY MOUTH EACH EVENING TO PREVENT COUGH OR WHEEZE 90 tablet 1   nystatin cream (MYCOSTATIN) Apply topically.     OLANZapine (ZYPREXA) 15 MG tablet Take 1 tablet (15 mg total) by mouth daily after breakfast. 30 tablet 0   Olopatadine HCl 0.2 % SOLN Place 1 drop into both eyes daily as  needed (for itching). 2.5 mL 5   propranolol (INDERAL) 60 MG tablet Take 60 mg by mouth 2 (two) times daily.     tamsulosin (FLOMAX) 0.4 MG CAPS capsule Take 0.4 mg by mouth daily.     triamcinolone ointment (KENALOG) 0.1 % Apply topically twice daily to BODY as needed for red, sandpaper like rash.  Do not use on face, groin or armpits. 80 g 1   VASCEPA 1 g capsule Take 2 g by mouth 2 (two) times daily.     Vitamin D, Ergocalciferol, (DRISDOL) 1.25 MG (50000 UNIT) CAPS capsule Take 50,000 Units by mouth once a week.     No current facility-administered medications for this visit.   Allergies: Allergies  Allergen Reactions   Griseofulvin Anaphylaxis, Other (See Comments), Shortness Of Breath and Swelling    Arm swelling Other reaction(s): Other (See Comments) Arm swelling   I reviewed her past medical history, social history, family history, and environmental history and no significant changes have been reported from her previous visit.  ROS: All others negative except as noted per HPI.   Objective: BP 110/70 (BP Location: Right Arm, Patient Position: Sitting, Cuff Size: Small)   Pulse 75   Temp 97.7 F (36.5 C) (Temporal)   Resp 16   SpO2 98%  There is no height or weight on file to calculate  BMI. General Appearance:  Alert, cooperative, no distress, appears stated age  Head:  Normocephalic, without obvious abnormality, atraumatic  Eyes:  Conjunctiva clear, EOM's intact Ears with cerumen bilaterally, TM- normal   Nose: Nares normal,  PINK nasal mucosa,   , hypertrophic turbinates, no visible anterior polyps, and septum midline,   Throat: Lips, tongue normal; teeth and gums normal, normal posterior oropharynx  Neck: Supple, symmetrical  Lungs:   clear to auscultation bilaterally, Respirations unlabored, no coughing  Heart:  regular rate and rhythm and no murmur, Appears well perfused  Extremities: No edema   Skin: Skin color, texture, turgor normal, annular eczematous patches on bilateral upper arms  Neurologic: No gross deficits   Previous notes and tests were reviewed. The plan was reviewed with the patient/family, and all questions/concerned were addressed.  It was my pleasure to see Yuridiana today and participate in her care. Please feel free to contact me with any questions or concerns.  Sincerely,  Ferol Luz, MD  Allergy & Immunology  [facility] Office: [phone]

## 2023-04-02 NOTE — Patient Instructions (Addendum)
 Mixed rhinitis: at baseline  Continue -Atrovent (ipatopium) nasal spray 1-2 sprays in each nostril up to three times daily for runny nose Continue levocetirizine 5 mg once a day as needed for runny nose.  Continue Flonase 1 sprays in each nostril twice day - do this every day  Continue montelukast 10 mg once a day Continue azelastine nasal spray 2 sprays twice a day - do this every day  Continue saline nasal rinses as needed for nasal symptoms. Use this before any medicated nasal sprays for best result Continue mupirocin ointment as needed for nasal sores   Dermatitis: flaring on neck  Start Dermasmoothe oil: apply to affected area twice daily  Continue triamcinolone ointment twice a day as needed  If not working can step up to clobetasol ointment twice daily   - do not use for more than 2 weeks  - do not use on face, armpits or groin   Eczematous Otitis Media  Use dermasmoothe oil on q tip for rash in ears    Candidiasis: yeast infection in creases of legs  - Start Clotrimazole-betamethasone cream twice daily as needed   Cough: controlled  Continue albuterol nebs once every 4 hours as needed for cough or wheeze   Sleep apnea Continue to use CPAP machine   Allergic conjunctivitis Continue Pataday (olopatadine 0.2%) eye drops one drop in each eye once a day as needed for red, itchy eyes  Reflux Continue dietary and lifestyle modifications as listed below Continue dexilant  Continue use IBS medications as precribed by GI   Follow up: 3 months   Thank you so much for letting me partake in your care today.  Don't hesitate to reach out if you have any additional concerns!  Ferol Luz, MD  [facility] [place] [place]

## 2023-04-30 ENCOUNTER — Other Ambulatory Visit: Payer: Self-pay | Admitting: Internal Medicine

## 2023-06-23 ENCOUNTER — Other Ambulatory Visit: Payer: Self-pay | Admitting: Internal Medicine

## 2023-07-03 NOTE — Patient Instructions (Incomplete)
 Mixed rhinitis: at baseline  Continue -Atrovent  (ipatopium) nasal spray 1-2 sprays in each nostril up to three times daily for runny nose Continue levocetirizine 5 mg once a day as needed for runny nose.  Continue Flonase  1 sprays in each nostril twice day - do this every day  Continue montelukast  10 mg once a day Continue azelastine  nasal spray 2 sprays twice a day - do this every day  Continue saline nasal rinses as needed for nasal symptoms. Use this before any medicated nasal sprays for best result Continue mupirocin  ointment as needed for nasal sores   Dermatitis: flaring on neck  Continue Dermasmoothe oil: apply to affected area twice daily  Continue triamcinolone  ointment twice a day as needed  If not working can step up to clobetasol  ointment twice daily   - do not use for more than 2 weeks  - do not use on face, armpits or groin   Eczematous Otitis Media  Use dermasmoothe oil on q tip for rash in ears    Candidiasis: yeast infection in creases of legs  - May useClotrimazole-betamethasone  cream twice daily as needed   Cough: controlled  Continue albuterol  nebs once every 4 hours as needed for cough or wheeze   Sleep apnea Continue to use CPAP machine   Allergic conjunctivitis Continue Pataday  (olopatadine  0.2%) eye drops one drop in each eye once a day as needed for red, itchy eyes  Reflux Continue dietary and lifestyle modifications as listed below Continue dexilant   Continue use IBS medications as precribed by GI   Follow up:  months

## 2023-07-04 ENCOUNTER — Ambulatory Visit: Admitting: Family

## 2023-07-04 ENCOUNTER — Encounter: Payer: Self-pay | Admitting: Family

## 2023-07-04 VITALS — BP 118/72 | HR 77 | Temp 98.1°F | Resp 20 | Wt 176.5 lb

## 2023-07-04 DIAGNOSIS — J310 Chronic rhinitis: Secondary | ICD-10-CM

## 2023-07-04 DIAGNOSIS — H608X3 Other otitis externa, bilateral: Secondary | ICD-10-CM

## 2023-07-04 MED ORDER — FLUTICASONE PROPIONATE 50 MCG/ACT NA SUSP: 2.0000 | Freq: Every day | NASAL | 5 refills | Status: AC

## 2023-07-04 MED ORDER — LEVOCETIRIZINE DIHYDROCHLORIDE 5 MG PO TABS: ORAL_TABLET | ORAL | 5 refills | Status: AC

## 2023-07-04 MED ORDER — FLUOCINOLONE ACETONIDE BODY 0.01 % EX OIL: TOPICAL_OIL | CUTANEOUS | 1 refills | Status: AC

## 2023-07-04 MED ORDER — IPRATROPIUM BROMIDE 0.06 % NA SOLN: NASAL | 3 refills | Status: DC

## 2023-07-04 MED ORDER — AZELASTINE HCL 0.1 % NA SOLN: 2.0000 | Freq: Two times a day (BID) | NASAL | 5 refills | Status: AC

## 2023-07-04 MED ORDER — MONTELUKAST SODIUM 10 MG PO TABS: ORAL_TABLET | ORAL | 1 refills | Status: AC

## 2023-07-04 NOTE — Progress Notes (Signed)
 [address] [place]  [address] Dept: [phone]  FOLLOW UP NOTE  Patient ID: Hermelinda Loop, female    DOB: [birth year]  Age: 48 y.o. MRN: [id number] Date of Office Visit: 07/04/2023  Assessment  Chief Complaint: Follow-up (Follow up on allergy's last lab showed increase sugar and some cholesterol. /Patient c/o stomach rash possible heat vs yeast?)  HPI ISOBEL EISENHUTH is a 48 year old female who presents today for follow-up of mixed rhinitis, dermatitis, eczematous otitis media, candidiasis, cough, sleep apnea, allergic conjunctivitis, and reflux.  She was last seen on April 02, 2023 by [facility].  Her support is here with her today and Mrs. Arlyn Lambing, her caretaker, was available via the phone.  She reports that she is now on a cholesterol medicine and her blood sugars elevated, but is not a diabetic.  Mixed rhinitis: She reports clear rhinorrhea and nasal congestion all the time.  They are not certain about postnasal drip.  She has not been treated for any sinus infections since we last saw her.  Her caretaker reports that she has been using saline nasal spray followed immediately by Flonase  nasal spray and azelastine  nasal spray.  Instructed to wait a little bit of time between the saline and Flonase  and azelastine  nasal spray so that she is not washing out the medicated nasal sprays.  She has not been using Atrovent  nasal spray.  She does take levocetirizine 5 mg daily and Singulair  10 mg daily.  Her caretaker feels like her allergies are at baseline.  Dermatitis on neck: His caretaker reports that that has cleared.  She does have triamcinolone  to use as needed.  Eczematous otitis media: Her caregiver reports that the Derma-Smoothe  oil helps.  Candidiasis: She reports she has an area in her groin region.  Her caregivers feel like it is from not drying her skin after getting out of the shower.  They do mention that she has creams for irritation and nystatin .  She does see a dermatologist  and the caregiver reports that she would like for her to follow-up with her dermatologist about this.  Cough: Her caregiver reports that she very seldom has to give her albuterol  via her nebulizer.  She does continue to wear her CPAP at night  Allergic conjunctivitis: She denies itchy watery eyes.  Her caregiver mentions that she has eyedrops and she just recently saw her eye doctor.  Reflux: She continues to take Dexilant  daily and denies any heartburn or reflux symptoms.   Drug Allergies:  Allergies  Allergen Reactions   Griseofulvin Anaphylaxis, Other (See Comments), Shortness Of Breath and Swelling    Arm swelling Other reaction(s): Other (See Comments) Arm swelling    Review of Systems: Negative except as per HPI  Physical Exam: BP 118/72   Pulse 77   Temp 98.1 F (36.7 C) (Temporal)   Resp 20   Wt 176 lb 8 oz (80.1 kg)   SpO2 96%   BMI 36.89 kg/m    Physical Exam  Diagnostics:    Assessment and Plan: No diagnosis found.  No orders of the defined types were placed in this encounter.   Patient Instructions  Mixed rhinitis: at baseline  Continue -Atrovent  (ipatopium) nasal spray 1-2 sprays in each nostril up to three times daily as needed for runny nose. Caution as this can be drying Continue levocetirizine 5 mg once a day as needed for runny nose.  Continue Flonase  1 sprays in each nostril twice day - do this every day  Continue montelukast  10 mg once a day Continue azelastine  nasal spray 2 sprays twice a day - do this every day  Continue saline nasal rinses as needed for nasal symptoms. Use this before any medicated nasal sprays for best result. Continue mupirocin  ointment as needed for nasal sores   Dermatitis: Continue Dermasmoothe oil: apply to affected area twice daily  Continue triamcinolone  ointment twice a day as needed  If not working can step up to clobetasol  ointment twice daily   - do not use for more than 2 weeks  - do not use on face,  armpits or groin   Eczematous Otitis Media  Use dermasmoothe oil on q tip for rash in ears    Candidiasis: yeast infection in creases of legs  - Schedule an appointment with her dermatologist/primary care physician to discuss  Cough: controlled  Continue albuterol  nebs once every 4 hours as needed for cough or wheeze   Sleep apnea Continue to use CPAP machine   Allergic conjunctivitis Continue Pataday  (olopatadine  0.2%) eye drops one drop in each eye once a day as needed for red, itchy eyes  Reflux Continue dietary and lifestyle modifications as listed below Continue dexilant   Continue use IBS medications as precribed by GI   Follow up: 4-6 months     Return in about 6 months (around 01/03/2024), or if symptoms worsen or fail to improve.    Thank you for the opportunity to care for this patient.  Please do not hesitate to contact me with questions.  Tinnie Forehand, FNP [facility] 

## 2024-01-14 NOTE — Patient Instructions (Incomplete)
 Mixed rhinitis: at baseline  May use -Atrovent  (ipatopium) nasal spray 1-2 sprays in each nostril up to three times daily as needed for runny nose. Caution as this can be drying Continue levocetirizine 5 mg once a day as needed for runny nose.  Continue Flonase  1 sprays in each nostril twice day - do this every day  Continue montelukast  10 mg once a day Continue azelastine  nasal spray 2 sprays twice a day - do this every day  Continue saline nasal rinses as needed for nasal symptoms. Use this before any medicated nasal sprays for best result. Continue mupirocin  ointment as needed for nasal sores   Dermatitis: Continue Dermasmoothe oil: apply to affected area twice daily as needed Continue triamcinolone  ointment twice a day as needed  If not working can step up to clobetasol  ointment twice daily   - do not use for more than 2 weeks  - do not use on face, armpits or groin   Eczematous Otitis Media  Use dermasmoothe oil on q tip for rash in ears as needed Try using the dermasmoothe for her itchy ears The exam of her ears looked good today, but if she continues to complain of ear pain recommend scheduling an appointment with her primary care physician or urgent care   Candidiasis: yeast infection in creases of legs  - Continue to follow up with dermatology/primary care physician  Cough: controlled  Continue albuterol  nebs once every 4 hours as needed for cough, wheeze, tightness of chest  or shortness of breath. Refill of albuterol  sent   Sleep apnea Continue to use CPAP machine   Allergic conjunctivitis Continue Pataday  (olopatadine  0.2%) eye drops one drop in each eye once a day as needed for red, itchy eyes  Reflux Continue dietary and lifestyle modifications as listed below Continue dexilant   Continue use IBS medications as precribed by GI   Follow up: 4-6 months or sooner if needed

## 2024-01-16 ENCOUNTER — Ambulatory Visit: Admitting: Family

## 2024-01-16 ENCOUNTER — Encounter: Payer: Self-pay | Admitting: Family

## 2024-01-16 VITALS — BP 102/64 | HR 67 | Temp 97.6°F | Resp 18

## 2024-01-16 DIAGNOSIS — R058 Other specified cough: Secondary | ICD-10-CM | POA: Diagnosis not present

## 2024-01-16 DIAGNOSIS — J310 Chronic rhinitis: Secondary | ICD-10-CM | POA: Diagnosis not present

## 2024-01-16 DIAGNOSIS — K219 Gastro-esophageal reflux disease without esophagitis: Secondary | ICD-10-CM

## 2024-01-16 DIAGNOSIS — L308 Other specified dermatitis: Secondary | ICD-10-CM | POA: Diagnosis not present

## 2024-01-16 DIAGNOSIS — H608X3 Other otitis externa, bilateral: Secondary | ICD-10-CM

## 2024-01-16 DIAGNOSIS — G4733 Obstructive sleep apnea (adult) (pediatric): Secondary | ICD-10-CM

## 2024-01-16 MED ORDER — ALBUTEROL SULFATE (2.5 MG/3ML) 0.083% IN NEBU: INHALATION_SOLUTION | RESPIRATORY_TRACT | 1 refills | Status: AC

## 2024-01-16 NOTE — Progress Notes (Signed)
 "  [address] [place] [address] Dept: [phone]  FOLLOW UP NOTE  Patient ID: Sonya Moran Gals, female    DOB: Jul 25, 1975  Age: 49 y.o. MRN: [id number] Date of Office Visit: 01/16/2024  Assessment  Chief Complaint: Allergies (Runny nose)  HPI [name] is a 49 year old female who presents today for follow-up of mixed rhinitis, dermatitis, eczematous otitis media, candidiasis, cough, sleep apnea, allergic conjunctivitis, and reflux.  She was last seen on July 04, 2023 by myself.  Her caretaker is here with her today and helps provide history.  She reports that since her last office visit she recently had been treated for UTI and finished the antibiotic.  She reports that she is supposed to start another medicine that she is to take every 3 days, but she has not started.  She is not sure of the name of this medication.  Mixed rhinitis: She reports rhinorrhea that she does not know the color of.  Her caretaker reports that she has not seen her blowing her nose.  She denies nasal congestion and postnasal drip.  Her caretaker reports that she has not complained of any sinus pressure or tenderness.  She has not been treated for any sinus infections since we last saw her.  She does give her levocetirizine 5 mg daily, Flonase  daily, montelukast  10 mg daily and azelastine  nasal spray daily.  She has not been using Atrovent  nasal spray.  Dermatitis: Her caregiver reports that she sees dermatology and they did a biopsy was told that she has eczema.  She has creams to use as needed.  She also mentions that she has not drying her skin off good, but then she will use creams to clear that up.  She has nystatin  powder.  Eczematous otitis media: Her caretaker reports that she has not had to use this recently.  She does report today on the office that her ears are been itchy and are hurting.  Cough: Her caretaker reports that she has not needed albuterol , but would like a refill since hers is expired.   She denies cough, wheeze, tightness in chest, or shortness of breath.  Sleep apnea: She continues to wear her CPAP at night.  Allergic conjunctivitis: She reports that she has been giving her the eyedrops, Pataday , daily rather than as needed.  Reflux: She continues to take Dexilant  daily and continues her IBS medications as prescribed by GI.   Drug Allergies:  Allergies[1]  Review of Systems: Negative except as per HPI   Physical Exam: BP 102/64   Pulse 67   Temp 97.6 F (36.4 C) (Temporal)   Resp 18   SpO2 96%    Physical Exam Constitutional:      Appearance: Normal appearance.  HENT:     Head: Normocephalic and atraumatic.     Comments: Pharynx normal,  eyes normal, ears normal, nose normal    Right Ear: Tympanic membrane, ear canal and external ear normal.     Left Ear: Tympanic membrane, ear canal and external ear normal.     Nose: Nose normal.     Mouth/Throat:     Mouth: Mucous membranes are moist.     Pharynx: Oropharynx is clear.  Eyes:     Conjunctiva/sclera: Conjunctivae normal.  Cardiovascular:     Rate and Rhythm: Regular rhythm.     Heart sounds: Normal heart sounds.  Pulmonary:     Effort: Pulmonary effort is normal.     Breath sounds: Normal breath  sounds.     Comments: Lungs clear to auscultation Musculoskeletal:     Cervical back: Neck supple.  Skin:    General: Skin is warm.  Neurological:     Mental Status: She is alert and oriented to person, place, and time.  Psychiatric:        Mood and Affect: Mood normal.        Behavior: Behavior normal.        Thought Content: Thought content normal.        Judgment: Judgment normal.     Diagnostics:  None  Assessment and Plan: 1. Mixed rhinitis   2. Chronic eczematous otitis externa of both ears   3. Other cough   4. Gastroesophageal reflux disease, unspecified whether esophagitis present   5. Other specified dermatitis   6. OSA on CPAP     Meds ordered this encounter  Medications    albuterol  (PROVENTIL ) (2.5 MG/3ML) 0.083% nebulizer solution    Sig: Use 1 unit dose via nebulizer every 4-6 hours as needed for cough, wheeze, tightness in chest, or shortness of breath    Dispense:  75 mL    Refill:  1    Patient Instructions  Mixed rhinitis: at baseline  May use -Atrovent  (ipatopium) nasal spray 1-2 sprays in each nostril up to three times daily as needed for runny nose. Caution as this can be drying Continue levocetirizine 5 mg once a day as needed for runny nose.  Continue Flonase  1 sprays in each nostril twice day - do this every day  Continue montelukast  10 mg once a day Continue azelastine  nasal spray 2 sprays twice a day - do this every day  Continue saline nasal rinses as needed for nasal symptoms. Use this before any medicated nasal sprays for best result. Continue mupirocin  ointment as needed for nasal sores   Dermatitis: Continue Dermasmoothe oil: apply to affected area twice daily as needed Continue triamcinolone  ointment twice a day as needed  If not working can step up to clobetasol  ointment twice daily   - do not use for more than 2 weeks  - do not use on face, armpits or groin   Eczematous Otitis Media  Use dermasmoothe oil on q tip for rash in ears as needed Try using the dermasmoothe for her itchy ears The exam of her ears looked good today, but if she continues to complain of ear pain recommend scheduling an appointment with her primary care physician or urgent care   Candidiasis: yeast infection in creases of legs  - Continue to follow up with dermatology/primary care physician  Cough: controlled  Continue albuterol  nebs once every 4 hours as needed for cough, wheeze, tightness of chest  or shortness of breath. Refill of albuterol  sent   Sleep apnea Continue to use CPAP machine   Allergic conjunctivitis Continue Pataday  (olopatadine  0.2%) eye drops one drop in each eye once a day as needed for red, itchy eyes  Reflux Continue dietary  and lifestyle modifications as listed below Continue dexilant   Continue use IBS medications as precribed by GI   Follow up: 4-6 months or sooner if needed    Return in about 6 months (around 07/15/2024), or if symptoms worsen or fail to improve.    Thank you for the opportunity to care for this patient.  Please do not hesitate to contact me with questions.  Wanda Craze, FNP [facility]         [1]  Allergies  Allergen Reactions   Griseofulvin Anaphylaxis, Other (See Comments), Shortness Of Breath and Swelling    Arm swelling Other reaction(s): Other (See Comments) Arm swelling   "
